# Patient Record
Sex: Female | Born: 1995 | Race: Black or African American | Hispanic: No | Marital: Single | Smoking: Former smoker
Health system: Southern US, Community
[De-identification: ages and names within clinical notes are randomized; demographics above are authoritative.]

## PROBLEM LIST (undated history)

## (undated) ENCOUNTER — Inpatient Hospital Stay (HOSPITAL_COMMUNITY): Payer: Self-pay

## (undated) ENCOUNTER — Emergency Department (HOSPITAL_COMMUNITY): Admission: EM | Payer: Medicaid Other | Source: Home / Self Care

## (undated) ENCOUNTER — Inpatient Hospital Stay (HOSPITAL_COMMUNITY): Payer: Medicaid Other

## (undated) DIAGNOSIS — J40 Bronchitis, not specified as acute or chronic: Secondary | ICD-10-CM

## (undated) DIAGNOSIS — S329XXA Fracture of unspecified parts of lumbosacral spine and pelvis, initial encounter for closed fracture: Secondary | ICD-10-CM

## (undated) DIAGNOSIS — A549 Gonococcal infection, unspecified: Secondary | ICD-10-CM

## (undated) DIAGNOSIS — N390 Urinary tract infection, site not specified: Secondary | ICD-10-CM

## (undated) DIAGNOSIS — G510 Bell's palsy: Secondary | ICD-10-CM

## (undated) DIAGNOSIS — A749 Chlamydial infection, unspecified: Secondary | ICD-10-CM

## (undated) DIAGNOSIS — A5901 Trichomonal vulvovaginitis: Secondary | ICD-10-CM

## (undated) HISTORY — PX: WISDOM TOOTH EXTRACTION: SHX21

---
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
## 2013-04-17 ENCOUNTER — Emergency Department (INDEPENDENT_AMBULATORY_CARE_PROVIDER_SITE_OTHER)
Admission: EM | Admit: 2013-04-17 | Discharge: 2013-04-17 | Disposition: A | Discharge status: Home / Self Care | Payer: Medicaid Other | Source: Home / Self Care | Attending: Emergency Medicine | Admitting: Emergency Medicine

## 2013-04-17 ENCOUNTER — Other Ambulatory Visit (HOSPITAL_COMMUNITY)
Admission: RE | Admit: 2013-04-17 | Discharge: 2013-04-17 | Disposition: A | Discharge status: Home / Self Care | Payer: Medicaid Other | Source: Ambulatory Visit | Attending: Emergency Medicine | Admitting: Emergency Medicine

## 2013-04-17 ENCOUNTER — Encounter (HOSPITAL_COMMUNITY): Payer: Self-pay | Admitting: Emergency Medicine

## 2013-04-17 DIAGNOSIS — Z113 Encounter for screening for infections with a predominantly sexual mode of transmission: Secondary | ICD-10-CM | POA: Insufficient documentation

## 2013-04-17 DIAGNOSIS — K1370 Unspecified lesions of oral mucosa: Secondary | ICD-10-CM

## 2013-04-17 DIAGNOSIS — L049 Acute lymphadenitis, unspecified: Secondary | ICD-10-CM

## 2013-04-17 DIAGNOSIS — R102 Pelvic and perineal pain: Secondary | ICD-10-CM

## 2013-04-17 DIAGNOSIS — N949 Unspecified condition associated with female genital organs and menstrual cycle: Secondary | ICD-10-CM

## 2013-04-17 DIAGNOSIS — N760 Acute vaginitis: Secondary | ICD-10-CM | POA: Insufficient documentation

## 2013-04-17 LAB — POCT URINALYSIS DIP (DEVICE)
Bilirubin Urine: NEGATIVE
Glucose, UA: NEGATIVE mg/dL
Hgb urine dipstick: NEGATIVE
Nitrite: NEGATIVE
Specific Gravity, Urine: >=1.03 (ref 1.005–1.030)
pH: 6.5 (ref 5.0–8.0)

## 2013-04-17 MED ORDER — AZITHROMYCIN 250 MG PO TABS
1000.0000 mg | ORAL_TABLET | Freq: Once | ORAL | Status: AC
  Administered 2013-04-17: 1000 mg via ORAL

## 2013-04-17 MED ORDER — NAPROXEN 500 MG PO TABS: 500.0000 mg | ORAL_TABLET | Freq: Two times a day (BID) | ORAL | Status: DC

## 2013-04-17 MED ORDER — AZITHROMYCIN 250 MG PO TABS
ORAL_TABLET | ORAL | Status: AC
  Filled 2013-04-17: qty 4

## 2013-04-17 MED ORDER — CHLORHEXIDINE GLUCONATE 0.12 % MT SOLN
15.0000 mL | Freq: Two times a day (BID) | OROMUCOSAL | Status: DC
Start: 2013-04-17 — End: 2013-08-29

## 2013-04-17 MED ORDER — LIDOCAINE HCL (PF) 1 % IJ SOLN
INTRAMUSCULAR | Status: AC
  Filled 2013-04-17: qty 5

## 2013-04-17 MED ORDER — CEFTRIAXONE SODIUM 250 MG IJ SOLR
250.0000 mg | Freq: Once | INTRAMUSCULAR | Status: AC
  Administered 2013-04-17: 250 mg via INTRAMUSCULAR

## 2013-04-17 NOTE — ED Provider Notes (Addendum)
History     CSN: [id number]  Arrival date & time 04/17/13  1241   First MD Initiated Contact with Patient 04/17/13 1305      Chief Complaint  Patient presents with  . Abdominal Pain  . Jaw Pain    (Consider location/radiation/quality/duration/timing/severity/associated sxs/prior treatment) HPI Comments: Patient presents urgent care describing pelvic discomfort and pressure WITH vaginal discharge for approximately 2 months. She does feel somewhat tired. Denies any fevers, nausea or vomiting. She does however have a tender lymph node on the mandibular angle on the right side. She also has a tender red ulcer in her lower gum line for several days. It hurts when she touches it with her tongue or when he CHEW. Denies urinary symptoms, such as burning and pressure with urination, no genitalia rashes or lesions.  Patient is a 17 y.o. female presenting with abdominal pain. The history is provided by the patient.  Abdominal Pain This is a new problem. The problem occurs constantly. Associated symptoms include abdominal pain. Pertinent negatives include no chest pain, no headaches and no shortness of breath. Nothing aggravates the symptoms. She has tried nothing for the symptoms. The treatment provided no relief.    Past Medical History  Diagnosis Date  . Bell's palsy   . Bronchitis     History reviewed. No pertinent past surgical history.  No family history on file.  History  Substance Use Topics  . Smoking status: Current Every Day Smoker    Types: Cigarettes  . Smokeless tobacco: Not on file  . Alcohol Use: Yes    OB History   Grav Para Term Preterm Abortions TAB SAB Ect Mult Living                  Review of Systems  Constitutional: Negative for fever, activity change, appetite change and fatigue.  HENT: Negative for sore throat, facial swelling, trouble swallowing and voice change.   Respiratory: Negative for shortness of breath.   Cardiovascular: Negative for chest  pain.  Gastrointestinal: Positive for abdominal pain.  Genitourinary: Positive for vaginal discharge and pelvic pain. Negative for dysuria, urgency, frequency, flank pain, decreased urine volume, genital sores and vaginal pain.  Skin: Negative for color change, pallor and rash.  Neurological: Negative for headaches.  Hematological: Does not bruise/bleed easily.    Allergies  Review of patient's allergies indicates no known allergies.  Home Medications   Current Outpatient Rx  Name  Route  Sig  Dispense  Refill  . cephALEXin (KEFLEX) 500 MG capsule   Oral   Take 1 capsule (500 mg total) by mouth 2 (two) times daily.   20 capsule   0   . chlorhexidine (PERIDEX) 0.12 % solution   Mouth/Throat   Use as directed 15 mLs in the mouth or throat 2 (two) times daily.   120 mL   0   . naproxen (NAPROSYN) 500 MG tablet   Oral   Take 1 tablet (500 mg total) by mouth 2 (two) times daily.   30 tablet   0   . phenazopyridine (PYRIDIUM) 200 MG tablet   Oral   Take 1 tablet (200 mg total) by mouth 3 (three) times daily as needed for pain.   6 tablet   0     BP 116/71  Pulse 68  Temp(Src) 98.4 F (36.9 C) (Oral)  Resp 17  SpO2 99%  LMP 03/18/2013  Physical Exam  Nursing note and vitals reviewed. Constitutional: She is oriented to person, place,  and time. Vital signs are normal. She appears well-developed and well-nourished.  Non-toxic appearance. She does not have a sickly appearance. She does not appear ill. No distress.  HENT:  Head: Normocephalic.  Mouth/Throat: Uvula is midline. Oral lesions present. No dental abscesses. No oropharyngeal exudate.    Eyes: Conjunctivae are normal.  Neck: Neck supple. No JVD present.  Pulmonary/Chest: Effort normal and breath sounds normal.  Abdominal: Soft. Normal appearance. There is no hepatosplenomegaly, splenomegaly or hepatomegaly. There is no tenderness. There is no rigidity, no rebound, no guarding, no CVA tenderness, no tenderness  at McBurney's point and negative Murphy's sign.  Musculoskeletal: She exhibits no tenderness.  Lymphadenopathy:    She has no cervical adenopathy.  Neurological: She is alert and oriented to person, place, and time.  Skin: No rash noted. No erythema.    ED Course  Procedures (including critical care time)  Labs Reviewed  POCT URINALYSIS DIP (DEVICE)  POCT PREGNANCY, URINE  URINE CYTOLOGY ANCILLARY ONLY   No results found.   1. Pelvic pain   2. Oral mucosal lesion   3. Lymphadenitis, acute       MDM  1. Vaginal discharge/pelvic pain. Patient with concerns about potential STD exposure. Patient is afebrile looks well no vomiting post-treatment. Tx Ceftriaxone and azithromycin. Samples have been obtained for urine cytology study. Have instructed patient that if worsening pelvic pain-or fevers or [facility]  2-aphthous ulcer with a reactive lymphadenopathy. Patient to use chlorhexidine.       Jimmie Molly, MD 04/17/13 1539  Jimmie Molly, MD 04/20/13 [phone]

## 2013-04-17 NOTE — ED Notes (Signed)
Pt c/o abdominal pain with discharge x 2 months. Has hx of bacterial vaginosis. Feels fatigued. Did not take temp but felt warm like she had a fever yesterday. No problems with urinating or bowel movements. Pain radiates from lower abdomen to ribs. Pt also has right sided jay pain x 1 month. Feels like lymph nodes are swollen. Hurts to chew. Patient is alert and oriented.

## 2013-04-20 ENCOUNTER — Telehealth (HOSPITAL_COMMUNITY): Payer: Self-pay | Admitting: *Deleted

## 2013-04-20 MED ORDER — METRONIDAZOLE 500 MG PO TABS: 500.0000 mg | ORAL_TABLET | Freq: Two times a day (BID) | ORAL | Status: DC

## 2013-04-20 NOTE — ED Notes (Signed)
GC/Chlamydia neg., Trich pos.  Order obtained from Dr. Ladon Applebaum for Flagyl.  I called pt. Pt. verified x 2 and given results.  Pt. told she needs Flagyl for Trich.   Pt. instructed to no alcohol while taking this medication, you need to notify your partner to be treated with Flagyl, no sex until you have finished your medication and your partner has been treated and to practice safe sex. You can get HIV testing at the [facility] STD clinic, by appointment. Pt.told where she can get her Rx. Pt. voiced understanding.

## 2013-04-23 NOTE — ED Notes (Signed)
Affirm: Candida neg., Gardnerella and Prevotella Bivia pos.  Pt. adequately treated with Flagyl. Vassie Moselle 04/23/2013

## 2013-04-29 ENCOUNTER — Encounter (HOSPITAL_COMMUNITY): Payer: Self-pay | Admitting: Emergency Medicine

## 2013-04-29 ENCOUNTER — Emergency Department (INDEPENDENT_AMBULATORY_CARE_PROVIDER_SITE_OTHER)
Admission: EM | Admit: 2013-04-29 | Discharge: 2013-04-29 | Disposition: A | Discharge status: Home / Self Care | Payer: Medicaid Other | Source: Home / Self Care | Attending: Family Medicine | Admitting: Family Medicine

## 2013-04-29 DIAGNOSIS — T148 Other injury of unspecified body region: Secondary | ICD-10-CM

## 2013-04-29 DIAGNOSIS — W57XXXA Bitten or stung by nonvenomous insect and other nonvenomous arthropods, initial encounter: Secondary | ICD-10-CM

## 2013-04-29 MED ORDER — FLUTICASONE PROPIONATE 0.05 % EX CREA: TOPICAL_CREAM | Freq: Two times a day (BID) | CUTANEOUS | Status: DC

## 2013-04-29 MED ORDER — HYDROXYZINE HCL 25 MG PO TABS: 25.0000 mg | ORAL_TABLET | Freq: Four times a day (QID) | ORAL | Status: DC

## 2013-04-29 MED ORDER — METHYLPREDNISOLONE ACETATE 40 MG/ML IJ SUSP
80.0000 mg | Freq: Once | INTRAMUSCULAR | Status: AC
  Administered 2013-04-29: 80 mg via INTRAMUSCULAR

## 2013-04-29 MED ORDER — METHYLPREDNISOLONE ACETATE 80 MG/ML IJ SUSP
INTRAMUSCULAR | Status: AC
  Filled 2013-04-29: qty 1

## 2013-04-29 NOTE — ED Notes (Signed)
Patient has bites or whelps on body and legs.  Areas itch.

## 2013-04-29 NOTE — ED Notes (Signed)
Patient reported to be outside.

## 2013-04-29 NOTE — ED Provider Notes (Addendum)
History     CSN: [id number]  Arrival date & time 04/29/13  1023   None     Chief Complaint  Patient presents with  . Insect Bite    (Consider location/radiation/quality/duration/timing/severity/associated sxs/prior treatment) Patient is a 17 y.o. female presenting with rash. The history is provided by the patient.  Rash Pain quality: burning   Duration:  3 days Timing:  Constant Progression:  Unchanged Chronicity:  New   Past Medical History  Diagnosis Date  . Bell's palsy   . Bronchitis     History reviewed. No pertinent past surgical history.  No family history on file.  History  Substance Use Topics  . Smoking status: Current Every Day Smoker    Types: Cigarettes  . Smokeless tobacco: Not on file  . Alcohol Use: Yes    OB History   Grav Para Term Preterm Abortions TAB SAB Ect Mult Living                  Review of Systems  Constitutional: Negative.   Skin: Positive for rash.    Allergies  Review of patient's allergies indicates no known allergies.  Home Medications   Current Outpatient Rx  Name  Route  Sig  Dispense  Refill  . cephALEXin (KEFLEX) 500 MG capsule   Oral   Take 1 capsule (500 mg total) by mouth 2 (two) times daily.   20 capsule   0   . chlorhexidine (PERIDEX) 0.12 % solution   Mouth/Throat   Use as directed 15 mLs in the mouth or throat 2 (two) times daily.   120 mL   0   . fluticasone (CUTIVATE) 0.05 % cream   Topical   Apply topically 2 (two) times daily.   60 g   0   . hydrOXYzine (ATARAX/VISTARIL) 25 MG tablet   Oral   Take 1 tablet (25 mg total) by mouth every 6 (six) hours. For itching   20 tablet   1   . metroNIDAZOLE (FLAGYL) 500 MG tablet   Oral   Take 1 tablet (500 mg total) by mouth 2 (two) times daily.   14 tablet   0   . naproxen (NAPROSYN) 500 MG tablet   Oral   Take 1 tablet (500 mg total) by mouth 2 (two) times daily.   30 tablet   0   . phenazopyridine (PYRIDIUM) 200 MG tablet   Oral  Take 1 tablet (200 mg total) by mouth 3 (three) times daily as needed for pain.   6 tablet   0     BP 107/60  Pulse 72  Temp(Src) 98.4 F (36.9 C) (Oral)  Resp 16  SpO2 100%  LMP 04/29/2013  Physical Exam  Nursing note and vitals reviewed. Constitutional: She is oriented to person, place, and time. She appears well-developed and well-nourished.  Neck: Normal range of motion. Neck supple.  Neurological: She is alert and oriented to person, place, and time.  Skin: Rash noted.  Multiple insect bites on ext     ED Course  Procedures (including critical care time)  Labs Reviewed - No data to display No results found.   1. Multiple insect bites       MDM          Linna Hoff, MD 04/29/13 1144  Linna Hoff, MD 04/29/13 1357

## 2013-08-29 ENCOUNTER — Emergency Department (INDEPENDENT_AMBULATORY_CARE_PROVIDER_SITE_OTHER)
Admission: EM | Admit: 2013-08-29 | Discharge: 2013-08-29 | Disposition: A | Discharge status: Nursing Home (Includes ICF) | Payer: Medicaid Other | Source: Home / Self Care | Attending: Emergency Medicine | Admitting: Emergency Medicine

## 2013-08-29 ENCOUNTER — Emergency Department (HOSPITAL_COMMUNITY)
Admission: EM | Admit: 2013-08-29 | Discharge: 2013-08-30 | Disposition: A | Discharge status: Home / Self Care | Payer: Medicaid Other | Attending: Emergency Medicine | Admitting: Emergency Medicine

## 2013-08-29 ENCOUNTER — Encounter (HOSPITAL_COMMUNITY): Payer: Self-pay

## 2013-08-29 ENCOUNTER — Encounter (HOSPITAL_COMMUNITY): Payer: Self-pay | Admitting: Emergency Medicine

## 2013-08-29 DIAGNOSIS — Z8669 Personal history of other diseases of the nervous system and sense organs: Secondary | ICD-10-CM | POA: Insufficient documentation

## 2013-08-29 DIAGNOSIS — Y9389 Activity, other specified: Secondary | ICD-10-CM | POA: Insufficient documentation

## 2013-08-29 DIAGNOSIS — R112 Nausea with vomiting, unspecified: Secondary | ICD-10-CM | POA: Insufficient documentation

## 2013-08-29 DIAGNOSIS — Z3202 Encounter for pregnancy test, result negative: Secondary | ICD-10-CM | POA: Insufficient documentation

## 2013-08-29 DIAGNOSIS — T398X1A Poisoning by other nonopioid analgesics and antipyretics, not elsewhere classified, accidental (unintentional), initial encounter: Secondary | ICD-10-CM | POA: Insufficient documentation

## 2013-08-29 DIAGNOSIS — Z79899 Other long term (current) drug therapy: Secondary | ICD-10-CM | POA: Insufficient documentation

## 2013-08-29 DIAGNOSIS — F17200 Nicotine dependence, unspecified, uncomplicated: Secondary | ICD-10-CM | POA: Insufficient documentation

## 2013-08-29 DIAGNOSIS — Y929 Unspecified place or not applicable: Secondary | ICD-10-CM | POA: Insufficient documentation

## 2013-08-29 DIAGNOSIS — Z792 Long term (current) use of antibiotics: Secondary | ICD-10-CM | POA: Insufficient documentation

## 2013-08-29 DIAGNOSIS — F39 Unspecified mood [affective] disorder: Secondary | ICD-10-CM | POA: Insufficient documentation

## 2013-08-29 DIAGNOSIS — Z8709 Personal history of other diseases of the respiratory system: Secondary | ICD-10-CM | POA: Insufficient documentation

## 2013-08-29 DIAGNOSIS — T391X1A Poisoning by 4-Aminophenol derivatives, accidental (unintentional), initial encounter: Secondary | ICD-10-CM

## 2013-08-29 DIAGNOSIS — R1110 Vomiting, unspecified: Secondary | ICD-10-CM

## 2013-08-29 DIAGNOSIS — IMO0002 Reserved for concepts with insufficient information to code with codable children: Secondary | ICD-10-CM | POA: Insufficient documentation

## 2013-08-29 DIAGNOSIS — R109 Unspecified abdominal pain: Secondary | ICD-10-CM | POA: Insufficient documentation

## 2013-08-29 LAB — CBC
MCHC: 36.1 g/dL — ABNORMAL HIGH (ref 30.0–36.0)
MCV: 86.6 fL (ref 78.0–100.0)
Platelets: 234 10*3/uL (ref 150–400)
RDW: 12.1 % (ref 11.5–15.5)
WBC: 4.4 10*3/uL (ref 4.0–10.5)

## 2013-08-29 LAB — RAPID URINE DRUG SCREEN, HOSP PERFORMED
Amphetamines: NOT DETECTED
Benzodiazepines: NOT DETECTED
Cocaine: NOT DETECTED
Opiates: NOT DETECTED

## 2013-08-29 LAB — COMPREHENSIVE METABOLIC PANEL
AST: 18 U/L (ref 0–37)
Albumin: 4.5 g/dL (ref 3.5–5.2)
BUN: 14 mg/dL (ref 6–23)
Chloride: 103 mEq/L (ref 96–112)
Creatinine, Ser: 0.65 mg/dL (ref 0.50–1.10)
Total Bilirubin: 0.4 mg/dL (ref 0.3–1.2)
Total Protein: 7.9 g/dL (ref 6.0–8.3)

## 2013-08-29 LAB — SALICYLATE LEVEL: Salicylate Lvl: <2 mg/dL — ABNORMAL LOW (ref 2.8–20.0)

## 2013-08-29 LAB — ACETAMINOPHEN LEVEL: Acetaminophen (Tylenol), Serum: 55.6 ug/mL — ABNORMAL HIGH (ref 10–30)

## 2013-08-29 LAB — ETHANOL: Alcohol, Ethyl (B): <11 mg/dL (ref 0–11)

## 2013-08-29 MED ORDER — SODIUM CHLORIDE 0.9 % IV BOLUS (SEPSIS)
1000.0000 mL | Freq: Once | INTRAVENOUS | Status: AC
  Administered 2013-08-29: 1000 mL via INTRAVENOUS

## 2013-08-29 MED ORDER — ONDANSETRON HCL 4 MG/2ML IJ SOLN
INTRAMUSCULAR | Status: AC
  Administered 2013-08-29: 4 mg via INTRAVENOUS
  Filled 2013-08-29: qty 2

## 2013-08-29 MED ORDER — ONDANSETRON HCL 4 MG/2ML IJ SOLN
4.0000 mg | Freq: Once | INTRAMUSCULAR | Status: AC
  Filled 2013-08-29: qty 2

## 2013-08-29 MED ORDER — ONDANSETRON HCL 4 MG/2ML IJ SOLN
4.0000 mg | Freq: Once | INTRAMUSCULAR | Status: AC
  Administered 2013-08-29: 4 mg via INTRAVENOUS

## 2013-08-29 MED ORDER — SODIUM CHLORIDE 0.9 % IV BOLUS (SEPSIS)
125.0000 mL | Freq: Once | INTRAVENOUS | Status: AC
  Administered 2013-08-29: 22:00:00 via INTRAVENOUS

## 2013-08-29 NOTE — ED Provider Notes (Addendum)
CSN: [id number]     Arrival date & time 08/29/13  1949 History   First MD Initiated Contact with Patient 08/29/13 2021     Chief Complaint  Patient presents with  . Drug Overdose   (Consider location/radiation/quality/duration/timing/severity/associated sxs/prior Treatment) HPI Comments: 17 yo AA female presents for UC with cc of Tylenol overdose.  Pt took 12 "600mg  tabs" and 1 Naproxyn 500mg .  Pt took them over a course of 1400 and took them up to 1700.  She took 10 tabs at once and then 2 more.  She reports n/v and abd pain.  Pt wasn't trying to kill herself.  She was sad and hurt and wanted to feel numb. She was unaware that tylenol could hurt her.     All: NKDA Med: Tylenol and motrin prn PMH: none PSH: none SHX: no EtOH tonight, Pt smoked one MJ joint tonight, no other drug use   Patient is a 17 y.o. female presenting with Overdose. The history is provided by the patient.  Drug Overdose This is a new problem. Episode onset: 1430 to 1900. The problem has been gradually worsening. Associated symptoms include abdominal pain. Pertinent negatives include no chest pain, no headaches and no shortness of breath. Nothing aggravates the symptoms. She has tried nothing for the symptoms.    Past Medical History  Diagnosis Date  . Bell's palsy   . Bronchitis    History reviewed. No pertinent past surgical history. History reviewed. No pertinent family history. History  Substance Use Topics  . Smoking status: Current Every Day Smoker -- 0.50 packs/day    Types: Cigarettes  . Smokeless tobacco: Not on file  . Alcohol Use: Yes   OB History   Grav Para Term Preterm Abortions TAB SAB Ect Mult Living                 Review of Systems  Constitutional: Negative.  Negative for fever, chills, diaphoresis, activity change, appetite change and fatigue.  Eyes: Negative.   Respiratory: Negative for apnea, cough, choking, chest tightness, shortness of breath, wheezing and stridor.    Cardiovascular: Negative for chest pain, palpitations and leg swelling.  Gastrointestinal: Positive for nausea, vomiting and abdominal pain. Negative for diarrhea, constipation, blood in stool, anal bleeding and rectal pain.  Genitourinary: Negative.  Negative for decreased urine volume, vaginal bleeding and vaginal discharge.  Skin: Negative.   Neurological: Negative for headaches.  Psychiatric/Behavioral: Positive for dysphoric mood. Negative for hallucinations, behavioral problems, decreased concentration and agitation. The patient is not nervous/anxious and is not hyperactive.     Allergies  Review of patient's allergies indicates no known allergies.  Home Medications   Current Outpatient Rx  Name  Route  Sig  Dispense  Refill  . cephALEXin (KEFLEX) 500 MG capsule   Oral   Take 1 capsule (500 mg total) by mouth 2 (two) times daily.   20 capsule   0   . chlorhexidine (PERIDEX) 0.12 % solution   Mouth/Throat   Use as directed 15 mLs in the mouth or throat 2 (two) times daily.   120 mL   0   . fluticasone (CUTIVATE) 0.05 % cream   Topical   Apply topically 2 (two) times daily.   60 g   0   . hydrOXYzine (ATARAX/VISTARIL) 25 MG tablet   Oral   Take 1 tablet (25 mg total) by mouth every 6 (six) hours. For itching   20 tablet   1   . metroNIDAZOLE (FLAGYL) 500  MG tablet   Oral   Take 1 tablet (500 mg total) by mouth 2 (two) times daily.   14 tablet   0   . naproxen (NAPROSYN) 500 MG tablet   Oral   Take 1 tablet (500 mg total) by mouth 2 (two) times daily.   30 tablet   0   . phenazopyridine (PYRIDIUM) 200 MG tablet   Oral   Take 1 tablet (200 mg total) by mouth 3 (three) times daily as needed for pain.   6 tablet   0    BP 118/80  Pulse 76  Temp(Src) 98 F (36.7 C) (Oral)  Resp 16  Ht 5\' 4"  (1.626 m)  Wt 113 lb (51.256 kg)  BMI 19.39 kg/m2  SpO2 100%  LMP 08/25/2013 Physical Exam  Nursing note and vitals reviewed. Constitutional: She is oriented  to person, place, and time. She appears well-developed and well-nourished.  HENT:  Head: Normocephalic and atraumatic.  Eyes: Conjunctivae are normal.  Neck: Normal range of motion. Neck supple.  Cardiovascular: Normal rate and regular rhythm.  Exam reveals no gallop and no friction rub.   No murmur heard. Pulmonary/Chest: Effort normal and breath sounds normal.  Abdominal: Soft. Bowel sounds are normal. She exhibits no distension and no mass. There is no rebound and no guarding.  Musculoskeletal: Normal range of motion.  Neurological: She is alert and oriented to person, place, and time. She has normal reflexes. No cranial nerve deficit. Coordination normal.  Skin: Skin is warm and dry.  Psychiatric: She has a normal mood and affect. Her behavior is normal. Judgment and thought content normal.  Denies SI/HI    ED Course  Procedures (including critical care time) Labs Review  Imaging Review No results found.   Date: 08/29/2013  Rate: 73  Rhythm: normal sinus rhythm and sinus tachycardia  QRS Axis: normal  Intervals: normal  ST/T Wave abnormalities: normal  Conduction Disutrbances:none  Narrative Interpretation: NSR, low voltage, QTc 458  Old EKG Reviewed: none available Results for orders placed during the hospital encounter of 08/29/13  CBC      Result Value Range   WBC 4.4  4.0 - 10.5 K/uL   RBC 4.48  3.87 - 5.11 MIL/uL   Hemoglobin 14.0  12.0 - 15.0 g/dL   HCT 16.1  09.6 - 04.5 %   MCV 86.6  78.0 - 100.0 fL   MCH 31.3  26.0 - 34.0 pg   MCHC 36.1 (*) 30.0 - 36.0 g/dL   RDW 40.9  81.1 - 91.4 %   Platelets 234  150 - 400 K/uL  COMPREHENSIVE METABOLIC PANEL      Result Value Range   Sodium 140  135 - 145 mEq/L   Potassium 3.9  3.5 - 5.1 mEq/L   Chloride 103  96 - 112 mEq/L   CO2 21  19 - 32 mEq/L   Glucose, Bld 80  70 - 99 mg/dL   BUN 14  6 - 23 mg/dL   Creatinine, Ser 7.82  0.50 - 1.10 mg/dL   Calcium 9.5  8.4 - 95.6 mg/dL   Total Protein 7.9  6.0 - 8.3 g/dL    Albumin 4.5  3.5 - 5.2 g/dL   AST 18  0 - 37 U/L   ALT 11  0 - 35 U/L   Alkaline Phosphatase 45  39 - 117 U/L   Total Bilirubin 0.4  0.3 - 1.2 mg/dL   GFR calc non Af Amer >90  >90  mL/min   GFR calc Af Amer >90  >90 mL/min  ACETAMINOPHEN LEVEL      Result Value Range   Acetaminophen (Tylenol), Serum 55.6 (*) 10 - 30 ug/mL  SALICYLATE LEVEL      Result Value Range   Salicylate Lvl <2.0 (*) 2.8 - 20.0 mg/dL  URINE RAPID DRUG SCREEN (HOSP PERFORMED)      Result Value Range   Opiates NONE DETECTED  NONE DETECTED   Cocaine NONE DETECTED  NONE DETECTED   Benzodiazepines NONE DETECTED  NONE DETECTED   Amphetamines NONE DETECTED  NONE DETECTED   Tetrahydrocannabinol POSITIVE (*) NONE DETECTED   Barbiturates NONE DETECTED  NONE DETECTED  ETHANOL      Result Value Range   Alcohol, Ethyl (B) <11  0 - 11 mg/dL  ACETAMINOPHEN LEVEL      Result Value Range   Acetaminophen (Tylenol), Serum 24.7  10 - 30 ug/mL  POCT PREGNANCY, URINE      Result Value Range   Preg Test, Ur NEGATIVE  NEGATIVE     MDM  No diagnosis found. 17 yo AA female presents with acute APAP ingestion.  12 x 650 =  Plan for tox labs, IVF, EKG, and likely NAC treatment based on history and n/v.  Will c/s P[facility].    9:01 PM Spoke with [facility] SPI (Patty) plan to wait for APAP and LFTs.  If APAP is toxic pt will need NAC, if LFTs elevated will give NAC.  NO indications for AC at this time.    1:43 AM D/w [facility] SPI (Patty) - both tylenol levels are nontoxic, LFTs are normal; pt has no evidence of toxidrome, no further treatment.  Lengthy d/w pt who denies SI/HI.  She is cogent, alert, and oriented, w/o intoxication (UDS neg except for + THC).  Pt denies thoughts  of harming herself or others.  She never had SI/HI.  She verbally contracts for safety.  I estimate that she is at low risk for SI/SA.  Strict ER precautions were given.  Pt agrees with plan.  Boyfriend at bedside and will be with pt tonight.    Darlys Gales, MD 08/30/13  1610  Darlys Gales, MD 08/30/13 [phone]

## 2013-08-29 NOTE — ED Notes (Signed)
Reported overdose of stated 13 tablets of 650 mg tylenol and 1 naprosyn , estimated by patient to be 1-2 h PTA to the [facility]; pt stated she was dropped off by her boyfriend. Admits to also having smoked weed when she had ingested the tylenol; LMP stated to be 08-21-2013. C/o she had been "sad", has been vomiting, pain in mid and upper abdominal area , HA, chest discomfort , heartburn, dizzy

## 2013-08-29 NOTE — ED Notes (Signed)
Patient reports that she was depressed and she took too much tylenol

## 2013-08-29 NOTE — ED Notes (Signed)
Pt actively vomiting.

## 2013-08-29 NOTE — ED Provider Notes (Signed)
Chief Complaint:   Chief Complaint  Patient presents with  . Drug Overdose    History of Present Illness:   Linda Khan is a 17 year old female who [facility] stating that she had overdosed on 13 tablets of acetaminophen 650 mg about an hour previously. She complains of headache, chest pain, heartburn, diarrhea, and dizziness. She does not have any difficulty breathing. She also states she took one naproxen and a small one marijuana cigarette this afternoon. The patient states she's been feeling sad, but declines to go into any details. She's not very communicative. She denies any anxiety, homicidal ideation, or hallucinations or delusions. She does not have any prior psychiatric history or history of prior overdoses.  Review of Systems:  Other than noted above, the patient denies any of the following symptoms: Systemic:  No fever, chills, sweats, fatigue, weight loss or gain. Resp:  No shortness of breath. Cardiovasc:  No chest pain, tightness, pressure, palpitations, dizziness, or syncope. GI:  No abdominal pain, nausea, vomiting, anorexia, diarrhea, or constipation. Neuro:  No headache, paresthesias, tremor, or muscle weakness. Psych:  No sadness, depression, crying, anxiety, panic, sleep disturbance, or suicidal or homicidal ideation.  No hallucinations or delusions.  PMFSH:  Past medical history, family history, social history, meds, and allergies were reviewed.   Physical Exam:   Vital signs:  There were no vitals taken for this visit. Gen:  Alert, oriented, in no distress. Lungs:  No respiratory distress.  Breath sounds clear and equal bilaterally.  No wheezes, rales, or rhonchi. Heart:  Regular rthythm.  No gallops, murmers, clicks or rubs. Abdomen:  Soft, flat and nontender.  No organomegaly or mass. Neuro:  Alert and oriented times 3. Speech clear, fluent and appropriate.  Cranial nerves intact.  No focal weakness. Psych:  She is somewhat irritable and  not very communicative.  Speech pattern is slow but otherwise normal.  Thought content normal with no homicidal ideation.  No paranoia, hallucinations, or delusions.  Memory, insight, and judgement normal.  Assessment:  The encounter diagnosis was Acetaminophen overdose, initial encounter.   Plan:   The patient was transferred to the ED via shuttle under supervision of security in stable condition.  Medical Decision Making: 17 year old female overdosed on 13, 650 mg acetaminophen tablets about an hour prior to coming here. Symptoms right now include headache, chest pain, heartburn, dizziness, and loose stools. She also took one naproxen, and smoked a marijuana cigarette previously. She denies use of alcohol or any other overdoses. Patient states she's been sad. She denies any prior history of overdoses or depression. She denies hallucinations or delusions. She's otherwise in good health.        Reuben Likes, MD 08/29/13 [phone]

## 2013-08-30 MED ORDER — ONDANSETRON HCL 4 MG PO TABS: 4.0000 mg | ORAL_TABLET | Freq: Four times a day (QID) | ORAL | Status: DC

## 2013-10-20 ENCOUNTER — Encounter (HOSPITAL_COMMUNITY): Payer: Self-pay | Admitting: Emergency Medicine

## 2013-10-20 ENCOUNTER — Emergency Department (INDEPENDENT_AMBULATORY_CARE_PROVIDER_SITE_OTHER)
Admission: EM | Admit: 2013-10-20 | Discharge: 2013-10-20 | Disposition: A | Discharge status: Home / Self Care | Payer: Medicaid Other | Source: Home / Self Care | Attending: Emergency Medicine | Admitting: Emergency Medicine

## 2013-10-20 DIAGNOSIS — R0789 Other chest pain: Secondary | ICD-10-CM

## 2013-10-20 DIAGNOSIS — E639 Nutritional deficiency, unspecified: Secondary | ICD-10-CM

## 2013-10-20 DIAGNOSIS — R071 Chest pain on breathing: Secondary | ICD-10-CM

## 2013-10-20 LAB — POCT URINALYSIS DIP (DEVICE)
Bilirubin Urine: NEGATIVE
Glucose, UA: NEGATIVE mg/dL
Ketones, ur: NEGATIVE mg/dL
Nitrite: NEGATIVE
Specific Gravity, Urine: 1.025 (ref 1.005–1.030)
pH: 7 (ref 5.0–8.0)

## 2013-10-20 NOTE — ED Provider Notes (Signed)
Medical screening examination/treatment/procedure(s) were performed by non-physician practitioner and as supervising physician I was immediately available for consultation/collaboration.  Kylie Gros, M.D.  Fawn Desrocher C Mercede Rollo, MD 10/20/13 1631 

## 2013-10-20 NOTE — ED Notes (Addendum)
C/o she has been having blood tinged sputum, green/yellow secretions when coughs; unsure LMP, sexually active w/o BC (If it happens, it happens) also c/o lower back pain

## 2013-10-20 NOTE — ED Provider Notes (Signed)
CSN: [id number]     Arrival date & time 10/20/13  1250 History   First MD Initiated Contact with Patient 10/20/13 1400     Chief Complaint  Patient presents with  . Hemoptysis   (Consider location/radiation/quality/duration/timing/severity/associated sxs/prior Treatment) HPI Comments: Pt unsure of lmp. Also c/o feeling weak and occasionally has vague abd pain and vague back pain.   Patient is a 17 y.o. female presenting with chest pain. The history is provided by the patient.  Chest Pain Chest pain location: central upper chest. Pain quality: aching and tightness   Pain radiates to:  Does not radiate Pain radiates to the back: no   Pain severity:  Mild Onset quality:  Unable to specify Duration:  1 week Timing:  Intermittent Progression:  Unchanged Chronicity:  New Context comment:  Only has pain when takes a deep breath, and it doesn't happen with every deep breath Relieved by:  None tried Worsened by:  Deep breathing Ineffective treatments:  None tried Associated symptoms: no abdominal pain, no cough, no dizziness, no fever, no nausea, no palpitations, no shortness of breath and not vomiting     Past Medical History  Diagnosis Date  . Bell's palsy   . Bronchitis    History reviewed. No pertinent past surgical history. History reviewed. No pertinent family history. History  Substance Use Topics  . Smoking status: Current Every Day Smoker -- 0.50 packs/day    Types: Cigarettes  . Smokeless tobacco: Not on file  . Alcohol Use: Yes   OB History   Grav Para Term Preterm Abortions TAB SAB Ect Mult Living                 Review of Systems  Constitutional: Positive for appetite change. Negative for fever and activity change.       Not interested in food so eating less for last week.   Respiratory: Negative for cough and shortness of breath.   Cardiovascular: Positive for chest pain. Negative for palpitations.  Gastrointestinal: Negative for nausea, vomiting and  abdominal pain.  Neurological: Negative for dizziness.    Allergies  Review of patient's allergies indicates no known allergies.  Home Medications   Current Outpatient Rx  Name  Route  Sig  Dispense  Refill  . ondansetron (ZOFRAN) 4 MG tablet   Oral   Take 1 tablet (4 mg total) by mouth every 6 (six) hours.   12 tablet   0    BP 109/75  Pulse 70  Temp(Src) 98.7 F (37.1 C) (Oral)  Resp 16  SpO2 100% Physical Exam  Constitutional: She appears well-developed and well-nourished. No distress.  Cardiovascular: Normal rate and regular rhythm.   Pulmonary/Chest: Effort normal and breath sounds normal. She exhibits tenderness.    Abdominal: Soft. Bowel sounds are normal. She exhibits no distension. There is no tenderness. There is no CVA tenderness.  Musculoskeletal:       Lumbar back: She exhibits no tenderness and no bony tenderness.    ED Course  Procedures (including critical care time) Labs Review Labs Reviewed  POCT URINALYSIS DIP (DEVICE) - Abnormal; Notable for the following:    Hgb urine dipstick SMALL (*)    All other components within normal limits  POCT PREGNANCY, URINE   Imaging Review No results found.  EKG Interpretation    Date/Time:    Ventricular Rate:    PR Interval:    QRS Duration:   QT Interval:    QTC Calculation:   R Axis:  Text Interpretation:              MDM   1. Chest wall pain   2. Inadequate dietary intake   Pt vague with sx and concerns.  I think she mostly wanted a pregnancy test.  Pt unconcerned with sx after I gave her test results, ready to go.     Cathlyn Parsons, NP 10/20/13 1550

## 2013-12-04 ENCOUNTER — Encounter (HOSPITAL_COMMUNITY): Payer: Self-pay | Admitting: Emergency Medicine

## 2013-12-04 ENCOUNTER — Emergency Department (HOSPITAL_COMMUNITY)
Admission: EM | Admit: 2013-12-04 | Discharge: 2013-12-04 | Disposition: A | Discharge status: Home / Self Care | Payer: Medicaid Other | Attending: Emergency Medicine | Admitting: Emergency Medicine

## 2013-12-04 DIAGNOSIS — Z8709 Personal history of other diseases of the respiratory system: Secondary | ICD-10-CM | POA: Insufficient documentation

## 2013-12-04 DIAGNOSIS — F17200 Nicotine dependence, unspecified, uncomplicated: Secondary | ICD-10-CM | POA: Insufficient documentation

## 2013-12-04 DIAGNOSIS — Z8669 Personal history of other diseases of the nervous system and sense organs: Secondary | ICD-10-CM | POA: Insufficient documentation

## 2013-12-04 DIAGNOSIS — L03317 Cellulitis of buttock: Principal | ICD-10-CM

## 2013-12-04 DIAGNOSIS — L0231 Cutaneous abscess of buttock: Secondary | ICD-10-CM | POA: Insufficient documentation

## 2013-12-04 MED ORDER — IBUPROFEN 800 MG PO TABS
800.0000 mg | ORAL_TABLET | Freq: Once | ORAL | Status: AC
  Administered 2013-12-04: 800 mg via ORAL
  Filled 2013-12-04: qty 1

## 2013-12-04 MED ORDER — CEPHALEXIN 500 MG PO CAPS: 500.0000 mg | ORAL_CAPSULE | Freq: Four times a day (QID) | ORAL | Status: DC

## 2013-12-04 MED ORDER — SULFAMETHOXAZOLE-TRIMETHOPRIM 800-160 MG PO TABS: 1.0000 | ORAL_TABLET | Freq: Two times a day (BID) | ORAL | Status: AC

## 2013-12-04 MED ORDER — HYDROCODONE-ACETAMINOPHEN 5-325 MG PO TABS: 1.0000 | ORAL_TABLET | Freq: Four times a day (QID) | ORAL | Status: DC | PRN

## 2013-12-04 NOTE — ED Notes (Signed)
Pt brought in by ems c/o boil on abscess

## 2013-12-04 NOTE — ED Provider Notes (Signed)
Medical screening examination/treatment/procedure(s) were performed by non-physician practitioner and as supervising physician I was immediately available for consultation/collaboration.    Arihaan Bellucci, MD 12/04/13 0612 

## 2013-12-04 NOTE — ED Provider Notes (Signed)
CSN: [id number]     Arrival date & time 12/04/13  0025 History   First MD Initiated Contact with Patient 12/04/13 0057     Chief Complaint  Patient presents with  . Abscess   (Consider location/radiation/quality/duration/timing/severity/associated sxs/prior Treatment) Patient is a 18 y.o. female presenting with abscess. The history is provided by the patient. No language interpreter was used.  Abscess Abscess location: R buttocks. Abscess quality: induration, painful, redness and weeping   Red streaking: no   Duration:  6 days Progression:  Worsening Pain details:    Timing:  Constant   Progression:  Worsening Chronicity:  New Context: not diabetes, not immunosuppression and not skin injury   Relieved by:  Nothing Exacerbated by: pressure to area. Associated symptoms: no anorexia, no fatigue, no fever, no nausea and no vomiting   Risk factors: no hx of MRSA and no prior abscess     Past Medical History  Diagnosis Date  . Bell's palsy   . Bronchitis    History reviewed. No pertinent past surgical history. No family history on file. History  Substance Use Topics  . Smoking status: Current Every Day Smoker -- 0.50 packs/day    Types: Cigarettes  . Smokeless tobacco: Not on file  . Alcohol Use: Yes   OB History   Grav Para Term Preterm Abortions TAB SAB Ect Mult Living                 Review of Systems  Constitutional: Negative for fever and fatigue.  Gastrointestinal: Negative for nausea, vomiting, rectal pain and anorexia.  Genitourinary: Negative for vaginal bleeding, vaginal discharge and vaginal pain.  Skin: Positive for color change.  All other systems reviewed and are negative.    Allergies  Review of patient's allergies indicates no known allergies.  Home Medications   Current Outpatient Rx  Name  Route  Sig  Dispense  Refill  . cephALEXin (KEFLEX) 500 MG capsule   Oral   Take 1 capsule (500 mg total) by mouth 4 (four) times daily.   28 capsule    0   . HYDROcodone-acetaminophen (NORCO/VICODIN) 5-325 MG per tablet   Oral   Take 1-2 tablets by mouth every 6 (six) hours as needed.   7 tablet   0   . sulfamethoxazole-trimethoprim (BACTRIM DS,SEPTRA DS) 800-160 MG per tablet   Oral   Take 1 tablet by mouth 2 (two) times daily.   14 tablet   0    BP 103/58  Pulse 77  Temp(Src) 98.5 F (36.9 C) (Oral)  Resp 16  SpO2 99%  Physical Exam  Nursing note and vitals reviewed. Constitutional: She is oriented to person, place, and time. She appears well-developed and well-nourished. No distress.  HENT:  Head: Normocephalic and atraumatic.  Eyes: Conjunctivae and EOM are normal. No scleral icterus.  Neck: Normal range of motion.  Pulmonary/Chest: Effort normal. No respiratory distress.  Genitourinary: Rectum normal. Rectal exam shows no external hemorrhoid, no fissure, no tenderness and anal tone normal.     No induration to the perineal region. No induration or TTP to rectum.  Musculoskeletal: Normal range of motion.  Neurological: She is alert and oriented to person, place, and time.  GCS 15. Patient moves extremities without ataxia.  Skin: Skin is warm and dry. No rash noted. She is not diaphoretic. No pallor.  Abscess to R inferior gluteal region. Indurated with little fluctuance; induration approximately 5cmx2cm. +Erythema without linear streaking. Central pustule draining minimally purulent serosanguinous fluid.  Psychiatric: She has a normal mood and affect. Her behavior is normal.    ED Course  Procedures (including critical care time) Labs Review Labs Reviewed - No data to display Imaging Review No results found.  EKG Interpretation   None      INCISION AND DRAINAGE Performed by: Antony Madura Consent: Verbal consent obtained. Risks and benefits: risks, benefits and alternatives were discussed Type: abscess  Body area: R buttocks  Anesthesia: local infiltration  Incision was made with a  scalpel.  Local anesthetic: lidocaine 2% with epinephrine  Anesthetic total: 6 ml  Complexity: complex Blunt dissection to break up loculations  Drainage: purulent  Drainage amount: small  Packing material: none  Patient tolerance: Patient tolerated the procedure well with no immediate complications.   MDM   1. Cellulitis and abscess of buttock    Uncomplicated cellulitis and abscess of right buttocks. Patient well and nontoxic appearing, hemodynamically stable, and afebrile. No rectal involvement or induration appreciated on physical exam. No induration or tenderness to the perineum. Patient denies any puslike drainage from her rectum or vaginal canal. She further denies any fevers, chills, vomiting, or pain with defecation.  Abscess incised and drained at bedside with small amount of purulent/bloody drainage. Given small amount of drainage, have recommended patient return in 48 hours for a recheck. Will also prescribe course of antibiotics for cellulitis. Return precautions discussed and patient agreeable to plan with no unaddressed concerns.   Filed Vitals:   12/04/13 0029 12/04/13 0113  BP: 112/71 103/58  Pulse: 99 77  Temp: 98.4 F (36.9 C) 98.5 F (36.9 C)  TempSrc:  Oral  Resp: 20 16  SpO2: 99% 99%     Antony Madura, PA-C 12/04/13 0241

## 2013-12-04 NOTE — ED Notes (Addendum)
Pt. Requesting ibuprofen before laving. PA made aware.

## 2013-12-04 NOTE — Discharge Instructions (Signed)
Recommend frequent warm soaks and warm compresses to the area, at least 4 times a day. Take the antibiotics prescribed to for the full course. Take ibuprofen as needed for pain control, 600 mg every 6 hours. You may take Norco as needed for severe pain. Keep the area clean and dry as much as possible. Return for a recheck in 48 hours. Return sooner if symptoms worsen.  Abscess Care After An abscess (also called a boil or furuncle) is an infected area that contains a collection of pus. Signs and symptoms of an abscess include pain, tenderness, redness, or hardness, or you may feel a moveable soft area under your skin. An abscess can occur anywhere in the body. The infection may spread to surrounding tissues causing cellulitis. A cut (incision) by the surgeon was made over your abscess and the pus was drained out. Gauze may have been packed into the space to provide a drain that will allow the cavity to heal from the inside outwards. The boil may be painful for 5 to 7 days. Most people with a boil do not have high fevers. Your abscess, if seen early, may not have localized, and may not have been lanced. If not, another appointment may be required for this if it does not get better on its own or with medications. HOME CARE INSTRUCTIONS   Only take over-the-counter or prescription medicines for pain, discomfort, or fever as directed by your caregiver.  When you bathe, soak and then remove gauze or iodoform packs at least daily or as directed by your caregiver. You may then wash the wound gently with mild soapy water. Repack with gauze or do as your caregiver directs. SEEK IMMEDIATE MEDICAL CARE IF:   You develop increased pain, swelling, redness, drainage, or bleeding in the wound site.  You develop signs of generalized infection including muscle aches, chills, fever, or a general ill feeling.  An oral temperature above 102 F (38.9 C) develops, not controlled by medication. See your caregiver for a  recheck if you develop any of the symptoms described above. If medications (antibiotics) were prescribed, take them as directed. Document Released: 06/01/2005 Document Revised: 02/05/2012 Document Reviewed: 01/27/2008 [facility] Patient Information 2014 [place], [place].  Cellulitis Cellulitis is an infection of the skin and the tissue under the skin. The infected area is usually red and tender. This happens most often in the arms and lower legs. HOME CARE   Take your antibiotic medicine as told. Finish the medicine even if you start to feel better.  Keep the infected arm or leg raised (elevated).  Put a warm cloth on the area up to 4 times per day.  Only take medicines as told by your doctor.  Keep all doctor visits as told. GET HELP RIGHT AWAY IF:   You have a fever.  You feel very sleepy.  You throw up (vomit) or have watery poop (diarrhea).  You feel sick and have muscle aches and pains.  You see red streaks on the skin coming from the infected area.  Your red area gets bigger or turns a dark color.  Your bone or joint under the infected area is painful after the skin heals.  Your infection comes back in the same area or different area.  You have a puffy (swollen) bump in the infected area.  You have new symptoms. MAKE SURE YOU:   Understand these instructions.  Will watch your condition.  Will get help right away if you are not doing well or  get worse. Document Released: 05/01/2008 Document Revised: 05/14/2012 Document Reviewed: 01/29/2012 [facility] Patient Information 2014 [place].

## 2013-12-04 NOTE — ED Notes (Signed)
Bed: [facility] Expected date:  Expected time:  Means of arrival:  Comments: EMS/17 yo with abscess on buttock

## 2014-01-25 ENCOUNTER — Emergency Department (HOSPITAL_COMMUNITY)
Admission: EM | Admit: 2014-01-25 | Discharge: 2014-01-25 | Disposition: A | Discharge status: Home / Self Care | Payer: Medicaid Other | Attending: Emergency Medicine | Admitting: Emergency Medicine

## 2014-01-25 ENCOUNTER — Encounter (HOSPITAL_COMMUNITY): Payer: Self-pay | Admitting: Emergency Medicine

## 2014-01-25 DIAGNOSIS — Z8669 Personal history of other diseases of the nervous system and sense organs: Secondary | ICD-10-CM | POA: Insufficient documentation

## 2014-01-25 DIAGNOSIS — Z8709 Personal history of other diseases of the respiratory system: Secondary | ICD-10-CM | POA: Insufficient documentation

## 2014-01-25 DIAGNOSIS — F17200 Nicotine dependence, unspecified, uncomplicated: Secondary | ICD-10-CM | POA: Insufficient documentation

## 2014-01-25 DIAGNOSIS — Z792 Long term (current) use of antibiotics: Secondary | ICD-10-CM | POA: Insufficient documentation

## 2014-01-25 DIAGNOSIS — Z3202 Encounter for pregnancy test, result negative: Secondary | ICD-10-CM

## 2014-01-25 LAB — POC URINE PREG, ED: Preg Test, Ur: NEGATIVE

## 2014-01-25 NOTE — ED Provider Notes (Signed)
Medical screening examination/treatment/procedure(s) were performed by non-physician practitioner and as supervising physician I was immediately available for consultation/collaboration.   EKG Interpretation None       Aurie Harroun R. Zakariya Knickerbocker, MD 01/25/14 2353 

## 2014-01-25 NOTE — ED Notes (Signed)
Pt denies abdominal pain, states she has had irregular periods before but they are normally about 2 weeks late.

## 2014-01-25 NOTE — ED Notes (Signed)
Patient states she thinks she might be pregnant.  Patient states she took a pregnancy test and it was negative.  Patient states she is feeling tired and run down.

## 2014-01-25 NOTE — Discharge Instructions (Signed)
Pregnancy Tests  HOW DO PREGNANCY TESTS WORK?  All pregnancy tests look for a special hormone in the urine or blood that is only present in pregnant women. This hormone, human chorionic gonadotropin (hCG), is also called the pregnancy hormone.   WHAT IS THE DIFFERENCE BETWEEN A URINE AND A BLOOD PREGNANCY TEST? IS ONE BETTER THAN THE OTHER?  There are two types of pregnancy tests.  · Blood tests.  · Urine tests.  Both tests look for the presence of hCG, the pregnancy hormone. Many women use a urine test or home pregnancy test (HPT) to find out if they are pregnant. HPTs are cheap, easy to use, can be done at home, and are private. When a woman has a positive result on an HPT, she needs to see her caregiver right away. The caregiver can confirm a positive HPT result with another urine test, a blood test, ultrasound, and a pelvic exam.   There are two types of blood tests you can get from a caregiver.   · A quantitative blood test (or the beta hCG test). This test measures the exact amount of hCG in the blood. This means it can pick up very small amounts of hCG, making it a very accurate test.  · A qualitative hCG blood test. This test gives a simple yes or no answer to whether you are pregnant. This test is more like a urine test in terms of its accuracy.  Blood tests can pick up hCG earlier in a pregnancy than urine tests can. Blood tests can tell if you are pregnant about 6 to 8 days after you release an egg from an ovary (ovulate). Urine tests can determine pregnancy about 2 weeks after ovulation.   HOW IS A HOME PREGNANCY TEST DONE?   There are many types of home pregnancy tests or HPTs that can be bought over-the-counter at drug or discount stores.   · Some involve collecting your urine in a cup and dipping a stick into the urine or putting some of the urine into a special container with an eyedropper.  · Others are done by placing a stick into your urine stream.  · Tests vary in how long you need to wait for  the stick or container to turn a certain color or have a symbol on it (like a plus or a minus).  · All tests come with written instructions. Most tests also have toll-free phone numbers to call if you have any questions about how to do the test or read the results.  HOW ACCURATE ARE HOME PREGNANCY TESTS?   HPTs are very accurate. Most brands of HPTs say they are 97% to 99% accurate when taken 1 week after missing your menstrual period, but this can vary with actual use. Each brand varies in how sensitive it is in picking up the pregnancy hormone hCG. If a test is not done correctly, it will be less accurate. Always check the package to make sure it is not past its expiration date. If it is, it will not be accurate. Most brands of HPTs tell users to do the test again in a few days, no matter what the results.   If you use an HPT too early in your pregnancy, you may not have enough of the pregnancy hormone hCG in your urine to have a positive test result. Most HPTs will be accurate if you test yourself around the time your period is due (about 2 weeks after you ovulate). You can   get a negative test result if you are not pregnant or if you ovulated later than you thought you did. You may also have problems with the pregnancy, which affects the amount of hCG you have in your urine. If your HPT is negative, test yourself again within a few days to 1 week. If you keep getting a negative result and think you are pregnant, talk with your caregiver right away about getting a blood pregnancy test.   FALSE POSITIVE PREGNANCY TEST  A false positive HPT can happen if there is blood or protein present in your urine. A false positive can also happen if you were recently pregnant or if you take a pregnancy test too soon after taking fertility drug that contains hCG. Also, some prescription medicines such as water pills (diuretics), tranquilizers, seizure medicines, psychiatric medicines, and allergy and nausea medicines  (promethazine) give false positive readings.  FALSE NEGATIVE PREGNANCY TEST  · A false negative HPT can happen if you do the test too early. Try to wait until you are at least 1 day late for your menstrual period.  · It may happen if you wait too long to test the urine (longer than 15 minutes).  · It may also happen if the urine is too diluted because you drank a lot of fluids before getting the urine sample. It is best to test the first morning urine after you get out of bed.  If your menstrual period did not start after a week of a negative HPT, repeat the pregnancy test.  CAN ANYTHING INTERFERE WITH HOME PREGNANCY TEST RESULTS?   Most medicines, both over-the-counter and prescription drugs, including birth control pills and antibiotics, should not affect the results of a HPT. Only those drugs that have the pregnancy hormone hCG in them can give a false positive test result. Drugs that have hCG in them may be used for treating infertility (not being able to get pregnant). Alcohol and illegal drugs do not affect HPT results, but you should not be using these substances if you are trying to get pregnant. If you have a positive pregnancy test, call your caregiver to make an appointment to begin prenatal care.  Document Released: 11/16/2003 Document Revised: 02/05/2012 Document Reviewed: 01/27/2011  [employer]® Patient Information ©2014 [employer].

## 2014-01-25 NOTE — ED Provider Notes (Signed)
CSN: [id number]     Arrival date & time 01/25/14  1932 History  This chart was scribed for non-physician practitioner, Roxy Horseman, PA-C, working with Juliet Rude. Rubin Payor, MD by Charline Bills, ED Scribe. This patient was seen in room TR08C/TR08C and the patient's care was started at 8:21 PM.    Chief Complaint  Patient presents with  . Possible Pregnancy     The history is provided by the patient. No language interpreter was used.   HPI Comments: Linda Khan is a 18 y.o. female who presents to the Emergency Department concerned of possible pregnancy. Pt states that she took a home pregnancy test that was negative. She reports irregular periods. She denies abdominal pain, vomiting and diarrhea.   Past Medical History  Diagnosis Date  . Bell's palsy   . Bronchitis    History reviewed. No pertinent past surgical history. History reviewed. No pertinent family history. History  Substance Use Topics  . Smoking status: Current Every Day Smoker -- 0.50 packs/day    Types: Cigarettes  . Smokeless tobacco: Not on file  . Alcohol Use: No   OB History   Grav Para Term Preterm Abortions TAB SAB Ect Mult Living                 Review of Systems  Gastrointestinal: Negative for nausea, vomiting and abdominal pain.     Allergies  Review of patient's allergies indicates no known allergies.  Home Medications   Current Outpatient Rx  Name  Route  Sig  Dispense  Refill  . cephALEXin (KEFLEX) 500 MG capsule   Oral   Take 1 capsule (500 mg total) by mouth 4 (four) times daily.   28 capsule   0   . HYDROcodone-acetaminophen (NORCO/VICODIN) 5-325 MG per tablet   Oral   Take 1-2 tablets by mouth every 6 (six) hours as needed.   7 tablet   0    Triage vitals: BP 122/73  Pulse 79  Temp(Src) 98.9 F (37.2 C) (Oral)  Resp 16  Wt 116 lb (52.617 kg)  SpO2 99%  LMP 11/14/2013 Physical Exam  Nursing note and vitals reviewed. Constitutional: She is oriented to person, place,  and time. She appears well-developed and well-nourished. No distress.  HENT:  Head: Normocephalic and atraumatic.  Eyes: Conjunctivae and EOM are normal. Pupils are equal, round, and reactive to light.  Neck: Normal range of motion. Neck supple. No tracheal deviation present.  Cardiovascular: Normal rate, regular rhythm and normal heart sounds.  Exam reveals no gallop and no friction rub.   No murmur heard. Pulmonary/Chest: Effort normal and breath sounds normal. No respiratory distress. She has no wheezes. She has no rales. She exhibits no tenderness.  Abdominal: Soft. Bowel sounds are normal. She exhibits no distension and no mass. There is no tenderness. There is no rebound and no guarding.  No focal abdominal tenderness, no RLQ tenderness or pain at McBurney's point, no RUQ tenderness or Murphy's sign, no left-sided abdominal tenderness, no fluid wave, or signs of peritonitis   Musculoskeletal: Normal range of motion. She exhibits no edema and no tenderness.  Neurological: She is alert and oriented to person, place, and time.  Skin: Skin is warm and dry.  Psychiatric: She has a normal mood and affect. Her behavior is normal. Judgment and thought content normal.    ED Course  Procedures (including critical care time) DIAGNOSTIC STUDIES: Oxygen Saturation is 99% on RA, normal by my interpretation.    COORDINATION  OF CARE: 8:23 PM-Discussed negative pregnancy test with patient. Will refer to OBGYN clinics.  Labs Review Labs Reviewed  POC URINE PREG, ED   Imaging Review No results found.   EKG Interpretation None      MDM   Final diagnoses:  Pregnancy test negative    Patient requesting pregnancy test. She denies any abdominal pain. Denies any discharge, dysuria. Denies any fevers. She states that she took an at-home pregnancy test was negative, but she has had irregular periods and still feels like she is pregnant. Pregnancy test here is negative. Discharged to home with  OB/GYN followup for irregular periods. I personally performed the services described in this documentation, which was scribed in my presence. The recorded information has been reviewed and is accurate.       Roxy Horsemanobert Belissa Kooy, PA-C 01/25/14 2242

## 2014-01-25 NOTE — ED Notes (Signed)
Pts LMP Dec, has been feeling tired and loses temper more than usual.  Came to ED to find out if she is pregnant.

## 2014-05-31 ENCOUNTER — Emergency Department (HOSPITAL_COMMUNITY)
Admission: EM | Admit: 2014-05-31 | Discharge: 2014-05-31 | Disposition: A | Discharge status: Home / Self Care | Payer: Medicaid Other | Source: Home / Self Care

## 2014-05-31 ENCOUNTER — Inpatient Hospital Stay (HOSPITAL_COMMUNITY)
Admission: AD | Admit: 2014-05-31 | Discharge: 2014-05-31 | Disposition: A | Discharge status: Home / Self Care | Payer: Self-pay | Source: Ambulatory Visit | Attending: Obstetrics and Gynecology | Admitting: Obstetrics and Gynecology

## 2014-05-31 DIAGNOSIS — N390 Urinary tract infection, site not specified: Secondary | ICD-10-CM | POA: Insufficient documentation

## 2014-05-31 LAB — URINALYSIS, ROUTINE W REFLEX MICROSCOPIC
BILIRUBIN URINE: NEGATIVE
Glucose, UA: NEGATIVE mg/dL
Ketones, ur: 15 mg/dL — AB
Nitrite: POSITIVE — AB
Specific Gravity, Urine: >1.03 — ABNORMAL HIGH (ref 1.005–1.030)
UROBILINOGEN UA: 0.2 mg/dL (ref 0.0–1.0)
pH: 6 (ref 5.0–8.0)

## 2014-05-31 LAB — URINE MICROSCOPIC-ADD ON

## 2014-05-31 LAB — POCT PREGNANCY, URINE: PREG TEST UR: NEGATIVE

## 2014-05-31 MED ORDER — PHENAZOPYRIDINE HCL 100 MG PO TABS
200.0000 mg | ORAL_TABLET | Freq: Three times a day (TID) | ORAL | Status: DC
  Administered 2014-05-31: 200 mg via ORAL
  Filled 2014-05-31: qty 2

## 2014-05-31 MED ORDER — PHENAZOPYRIDINE HCL 200 MG PO TABS: 200.0000 mg | ORAL_TABLET | Freq: Three times a day (TID) | ORAL | Status: DC | PRN

## 2014-05-31 MED ORDER — CIPROFLOXACIN HCL 500 MG PO TABS: 500.0000 mg | ORAL_TABLET | Freq: Two times a day (BID) | ORAL | Status: DC

## 2014-05-31 NOTE — Discharge Instructions (Signed)

## 2014-05-31 NOTE — MAU Note (Signed)
Pt presents to MAU with complaints of urine frequency with pain with urination.

## 2014-05-31 NOTE — MAU Provider Note (Signed)
Attestation of Attending Supervision of Advanced Practitioner (CNM/NP): Evaluation and management procedures were performed by the Advanced Practitioner under my supervision and collaboration.  I have reviewed the Advanced Practitioner's note and chart, and I agree with the management and plan.  Kalub Morillo 05/31/2014 8:32 PM

## 2014-05-31 NOTE — MAU Provider Note (Signed)
S: 18 y.o. female presents to MAU with s/sx of UTI.  She reports dysuria and lower abdominal pain x24 hours. Her abdominal pain is worse when urinating.  She reports she feels urgency but is unable to urinate more than a little bit each time she goes.  She denies vaginal bleeding, vaginal itching/burning, h/a, dizziness, n/v, or fever/chills.    O: BP 128/66  Pulse 63  Temp(Src) 98.9 F (37.2 C) (Oral)  Resp 16  Results for orders placed during the hospital encounter of 05/31/14 (from the past 48 hour(s))  URINALYSIS, ROUTINE W REFLEX MICROSCOPIC     Status: Abnormal   Collection Time    05/31/14  2:12 PM      Result Value Ref Range   Color, Urine YELLOW  YELLOW   APPearance HAZY (*) CLEAR   Specific Gravity, Urine >1.030 (*) 1.005 - 1.030   pH 6.0  5.0 - 8.0   Glucose, UA NEGATIVE  NEGATIVE mg/dL   Hgb urine dipstick LARGE (*) NEGATIVE   Bilirubin Urine NEGATIVE  NEGATIVE   Ketones, ur 15 (*) NEGATIVE mg/dL   Protein, ur >540>300 (*) NEGATIVE mg/dL   Urobilinogen, UA 0.2  0.0 - 1.0 mg/dL   Nitrite POSITIVE (*) NEGATIVE   Leukocytes, UA SMALL (*) NEGATIVE  URINE MICROSCOPIC-ADD ON     Status: Abnormal   Collection Time    05/31/14  2:12 PM      Result Value Ref Range   Squamous Epithelial / LPF FEW (*) RARE   WBC, UA 7-10  <3 WBC/hpf   RBC / HPF 11-20  <3 RBC/hpf   Bacteria, UA FEW (*) RARE  POCT PREGNANCY, URINE     Status: None   Collection Time    05/31/14  2:25 PM      Result Value Ref Range   Preg Test, Ur NEGATIVE  NEGATIVE   Comment:            THE SENSITIVITY OF THIS     METHODOLOGY IS >24 mIU/mL   A:  1. UTI (lower urinary tract infection)    P: D/C home Cipro 500 mg BID x 7 days Pyridium 200 mg TID PRN Urine sent for culture Return to MAU if symptoms persist or worsen   Sharen CounterLisa Leftwich-Kirby Certified Nurse-Midwife

## 2014-06-02 LAB — URINE CULTURE

## 2014-08-08 ENCOUNTER — Emergency Department (HOSPITAL_COMMUNITY): Payer: Medicaid Other

## 2014-08-08 ENCOUNTER — Encounter (HOSPITAL_COMMUNITY): Payer: Self-pay | Admitting: Emergency Medicine

## 2014-08-08 ENCOUNTER — Emergency Department (HOSPITAL_COMMUNITY)
Admission: EM | Admit: 2014-08-08 | Discharge: 2014-08-08 | Disposition: A | Discharge status: Home / Self Care | Payer: Medicaid Other | Attending: Emergency Medicine | Admitting: Emergency Medicine

## 2014-08-08 DIAGNOSIS — Z8709 Personal history of other diseases of the respiratory system: Secondary | ICD-10-CM | POA: Insufficient documentation

## 2014-08-08 DIAGNOSIS — S298XXA Other specified injuries of thorax, initial encounter: Secondary | ICD-10-CM | POA: Insufficient documentation

## 2014-08-08 DIAGNOSIS — S6990XA Unspecified injury of unspecified wrist, hand and finger(s), initial encounter: Secondary | ICD-10-CM | POA: Insufficient documentation

## 2014-08-08 DIAGNOSIS — S0003XA Contusion of scalp, initial encounter: Secondary | ICD-10-CM | POA: Insufficient documentation

## 2014-08-08 DIAGNOSIS — S99929A Unspecified injury of unspecified foot, initial encounter: Secondary | ICD-10-CM

## 2014-08-08 DIAGNOSIS — F17200 Nicotine dependence, unspecified, uncomplicated: Secondary | ICD-10-CM | POA: Insufficient documentation

## 2014-08-08 DIAGNOSIS — S0990XA Unspecified injury of head, initial encounter: Secondary | ICD-10-CM | POA: Insufficient documentation

## 2014-08-08 DIAGNOSIS — S8990XA Unspecified injury of unspecified lower leg, initial encounter: Secondary | ICD-10-CM | POA: Insufficient documentation

## 2014-08-08 DIAGNOSIS — Z792 Long term (current) use of antibiotics: Secondary | ICD-10-CM | POA: Insufficient documentation

## 2014-08-08 DIAGNOSIS — T148XXA Other injury of unspecified body region, initial encounter: Secondary | ICD-10-CM

## 2014-08-08 DIAGNOSIS — S0083XA Contusion of other part of head, initial encounter: Principal | ICD-10-CM | POA: Insufficient documentation

## 2014-08-08 DIAGNOSIS — S1093XA Contusion of unspecified part of neck, initial encounter: Principal | ICD-10-CM

## 2014-08-08 DIAGNOSIS — Z8669 Personal history of other diseases of the nervous system and sense organs: Secondary | ICD-10-CM | POA: Insufficient documentation

## 2014-08-08 DIAGNOSIS — R0781 Pleurodynia: Secondary | ICD-10-CM

## 2014-08-08 DIAGNOSIS — S99919A Unspecified injury of unspecified ankle, initial encounter: Secondary | ICD-10-CM

## 2014-08-08 MED ORDER — HYDROCODONE-ACETAMINOPHEN 5-325 MG PO TABS: 1.0000 | ORAL_TABLET | Freq: Four times a day (QID) | ORAL | Status: DC | PRN

## 2014-08-08 MED ORDER — HYDROCODONE-ACETAMINOPHEN 5-325 MG PO TABS
2.0000 | ORAL_TABLET | Freq: Once | ORAL | Status: AC
  Administered 2014-08-08: 2 via ORAL
  Filled 2014-08-08: qty 2
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
## 2014-08-08 NOTE — Discharge Instructions (Signed)
Assault, General °Assault includes any behavior, whether intentional or reckless, which results in bodily injury to another person and/or damage to property. Included in this would be any behavior, intentional or reckless, that by its nature would be understood (interpreted) by a reasonable person as intent to harm another person or to damage his/her property. Threats may be oral or written. They may be communicated through regular mail, computer, fax, or phone. These threats may be direct or implied. °FORMS OF ASSAULT INCLUDE: °· Physically assaulting a person. This includes physical threats to inflict physical harm as well as: °¨ Slapping. °¨ Hitting. °¨ Poking. °¨ Kicking. °¨ Punching. °¨ Pushing. °· Arson. °· Sabotage. °· Equipment vandalism. °· Damaging or destroying property. °· Throwing or hitting objects. °· Displaying a weapon or an object that appears to be a weapon in a threatening manner. °¨ Carrying a firearm of any kind. °¨ Using a weapon to harm someone. °· Using greater physical size/strength to intimidate another. °¨ Making intimidating or threatening gestures. °¨ Bullying. °¨ Hazing. °· Intimidating, threatening, hostile, or abusive language directed toward another person. °¨ It communicates the intention to engage in violence against that person. And it leads a reasonable person to expect that violent behavior may occur. °· Stalking another person. °IF IT HAPPENS AGAIN: °· Immediately call for emergency help (911 in [place] °· If someone poses clear and immediate danger to you, seek legal authorities to have a protective or restraining order put in place. °· Less threatening assaults can at least be reported to authorities. °STEPS TO TAKE IF A SEXUAL ASSAULT HAS HAPPENED °· Go to an area of safety. This may include a shelter or staying with a friend. Stay away from the area where you have been attacked. A large percentage of sexual assaults are caused by a friend, relative or associate. °· If  medications were given by your caregiver, take them as directed for the full length of time prescribed. °· Only take over-the-counter or prescription medicines for pain, discomfort, or fever as directed by your caregiver. °· If you have come in contact with a sexual disease, find out if you are to be tested again. If your caregiver is concerned about the HIV/AIDS virus, he/she may require you to have continued testing for several months. °· For the protection of your privacy, test results can not be given over the phone. Make sure you receive the results of your test. If your test results are not back during your visit, make an appointment with your caregiver to find out the results. Do not assume everything is normal if you have not heard from your caregiver or the medical facility. It is important for you to follow up on all of your test results. °· File appropriate papers with authorities. This is important in all assaults, even if it has occurred in a family or by a friend. °SEEK MEDICAL CARE IF: °· You have new problems because of your injuries. °· You have problems that may be because of the medicine you are taking, such as: °¨ Rash. °¨ Itching. °¨ Swelling. °¨ Trouble breathing. °· You develop belly (abdominal) pain, feel sick to your stomach (nausea) or are vomiting. °· You begin to run a temperature. °· You need supportive care or referral to a rape crisis center. These are centers with trained personnel who can help you get through this ordeal. °SEEK IMMEDIATE MEDICAL CARE IF: °· You are afraid of being threatened, beaten, or abused. In [place] call 911. °· You   receive new injuries related to abuse.  You develop severe pain in any area injured in the assault or have any change in your condition that concerns you.  You faint or lose consciousness.  You develop chest pain or shortness of breath. Document Released: 11/13/2005 Document Revised: 02/05/2012 Document Reviewed: 07/01/2008 [facility] Patient  Information 2015 [place], [place]. This information is not intended to replace advice given to you by your health care provider. Make sure you discuss any questions you have with your health care provider.  Hematoma A hematoma is a collection of blood under the skin, in an organ, in a body space, in a joint space, or in other tissue. The blood can clot to form a lump that you can see and feel. The lump is often firm and may sometimes become sore and tender. Most hematomas get better in a few days to weeks. However, some hematomas may be serious and require medical care. Hematomas can range in size from very small to very large. CAUSES  A hematoma can be caused by a blunt or penetrating injury. It can also be caused by spontaneous leakage from a blood vessel under the skin. Spontaneous leakage from a blood vessel is more likely to occur in older people, especially those taking blood thinners. Sometimes, a hematoma can develop after certain medical procedures. SIGNS AND SYMPTOMS   A firm lump on the body.  Possible pain and tenderness in the area.  Bruising.Blue, dark blue, purple-red, or yellowish skin may appear at the site of the hematoma if the hematoma is close to the surface of the skin. For hematomas in deeper tissues or body spaces, the signs and symptoms may be subtle. For example, an intra-abdominal hematoma may cause abdominal pain, weakness, fainting, and shortness of breath. An intracranial hematoma may cause a headache or symptoms such as weakness, trouble speaking, or a change in consciousness. DIAGNOSIS  A hematoma can usually be diagnosed based on your medical history and a physical exam. Imaging tests may be needed if your health care provider suspects a hematoma in deeper tissues or body spaces, such as the abdomen, head, or chest. These tests may include ultrasonography or a CT scan.  TREATMENT  Hematomas usually go away on their own over time. Rarely does the blood need to be  drained out of the body. Large hematomas or those that may affect vital organs will sometimes need surgical drainage or monitoring. HOME CARE INSTRUCTIONS   Apply ice to the injured area:   Put ice in a plastic bag.   Place a towel between your skin and the bag.   Leave the ice on for 20 minutes, 2-3 times a day for the first 1 to 2 days.   After the first 2 days, switch to using warm compresses on the hematoma.   Elevate the injured area to help decrease pain and swelling. Wrapping the area with an elastic bandage may also be helpful. Compression helps to reduce swelling and promotes shrinking of the hematoma. Make sure the bandage is not wrapped too tight.   If your hematoma is on a lower extremity and is painful, crutches may be helpful for a couple days.   Only take over-the-counter or prescription medicines as directed by your health care provider. SEEK IMMEDIATE MEDICAL CARE IF:   You have increasing pain, or your pain is not controlled with medicine.   You have a fever.   You have worsening swelling or discoloration.   Your skin over the hematoma  breaks or starts bleeding.   Your hematoma is in your chest or abdomen and you have weakness, shortness of breath, or a change in consciousness.  Your hematoma is on your scalp (caused by a fall or injury) and you have a worsening headache or a change in alertness or consciousness. MAKE SURE YOU:   Understand these instructions.  Will watch your condition.  Will get help right away if you are not doing well or get worse. Document Released: 06/27/2004 Document Revised: 07/16/2013 Document Reviewed: 04/23/2013 [facility] Patient Information 2015 [place], [place]. This information is not intended to replace advice given to you by your health care provider. Make sure you discuss any questions you have with your health care provider.

## 2014-08-08 NOTE — ED Notes (Signed)
Patient transported to X-ray 

## 2014-08-08 NOTE — ED Notes (Signed)
Patient returned from X-ray 

## 2014-08-08 NOTE — ED Notes (Signed)
Emergency planning/management officer at bedside for interview.
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
## 2014-08-12 NOTE — ED Provider Notes (Signed)
Medical screening examination/treatment/procedure(s) were performed by non-physician practitioner and as supervising physician I was immediately available for consultation/collaboration.   EKG Interpretation None        Samuel Jester, DO 08/12/14 [phone]

## 2014-08-14 ENCOUNTER — Inpatient Hospital Stay (HOSPITAL_COMMUNITY)
Admission: AD | Admit: 2014-08-14 | Discharge: 2014-08-14 | Disposition: A | Discharge status: Home / Self Care | Payer: Self-pay | Source: Ambulatory Visit | Attending: Obstetrics and Gynecology | Admitting: Obstetrics and Gynecology

## 2014-08-14 ENCOUNTER — Encounter (HOSPITAL_COMMUNITY): Payer: Self-pay | Admitting: *Deleted

## 2014-08-14 DIAGNOSIS — N760 Acute vaginitis: Secondary | ICD-10-CM

## 2014-08-14 DIAGNOSIS — O2390 Unspecified genitourinary tract infection in pregnancy, unspecified trimester: Secondary | ICD-10-CM

## 2014-08-14 DIAGNOSIS — Z3202 Encounter for pregnancy test, result negative: Secondary | ICD-10-CM | POA: Insufficient documentation

## 2014-08-14 DIAGNOSIS — F17200 Nicotine dependence, unspecified, uncomplicated: Secondary | ICD-10-CM | POA: Insufficient documentation

## 2014-08-14 DIAGNOSIS — B9689 Other specified bacterial agents as the cause of diseases classified elsewhere: Secondary | ICD-10-CM | POA: Insufficient documentation

## 2014-08-14 DIAGNOSIS — A499 Bacterial infection, unspecified: Secondary | ICD-10-CM

## 2014-08-14 LAB — URINALYSIS, ROUTINE W REFLEX MICROSCOPIC
BILIRUBIN URINE: NEGATIVE
Glucose, UA: NEGATIVE mg/dL
HGB URINE DIPSTICK: NEGATIVE
Ketones, ur: NEGATIVE mg/dL
Leukocytes, UA: NEGATIVE
Nitrite: NEGATIVE
Protein, ur: NEGATIVE mg/dL
Specific Gravity, Urine: 1.02 (ref 1.005–1.030)
UROBILINOGEN UA: 0.2 mg/dL (ref 0.0–1.0)
pH: 6.5 (ref 5.0–8.0)

## 2014-08-14 LAB — POCT PREGNANCY, URINE: Preg Test, Ur: NEGATIVE

## 2014-08-14 LAB — WET PREP, GENITAL
TRICH WET PREP: NONE SEEN
Yeast Wet Prep HPF POC: NONE SEEN

## 2014-08-14 LAB — RPR

## 2014-08-14 MED ORDER — METRONIDAZOLE 500 MG PO TABS: 500.0000 mg | ORAL_TABLET | Freq: Two times a day (BID) | ORAL | Status: DC

## 2014-08-14 NOTE — MAU Provider Note (Signed)
Attestation of Attending Supervision of Advanced Practitioner (CNM/NP): Evaluation and management procedures were performed by the Advanced Practitioner under my supervision and collaboration.  I have reviewed the Advanced Practitioner's note and chart, and I agree with the management and plan.  Emit Kuenzel 08/14/2014 5:47 PM

## 2014-08-14 NOTE — Discharge Instructions (Signed)
Bacterial Vaginosis Bacterial vaginosis is an infection of the vagina. It happens when too many of certain germs (bacteria) grow in the vagina. HOME CARE  Take your medicine as told by your doctor.  Finish your medicine even if you start to feel better.  Do not have sex until you finish your medicine and are better.  Tell your sex partner that you have an infection. They should see their doctor for treatment.  Practice safe sex. Use condoms. Have only one sex partner. GET HELP IF:  You are not getting better after 3 days of treatment.  You have more grey fluid (discharge) coming from your vagina than before.  You have more pain than before.  You have a fever. MAKE SURE YOU:   Understand these instructions.  Will watch your condition.  Will get help right away if you are not doing well or get worse. Document Released: 08/22/2008 Document Revised: 09/03/2013 Document Reviewed: 06/25/2013 [employer] Patient Information 2015 [employer]. This information is not intended to replace advice given to you by your health care provider. Make sure you discuss any questions you have with your health care provider.  

## 2014-08-14 NOTE — MAU Note (Signed)
Patient states she has been having lower abdominal pain and a milky vaginal discharge with an odor for 2 days. Denies nausea, vomiting or bleeding.

## 2014-08-14 NOTE — MAU Provider Note (Signed)
History     CSN: [id number]  Arrival date and time: 08/14/14 4696   First Provider Initiated Contact with Patient 08/14/14 1045      Chief Complaint  Patient presents with  . Abdominal Pain  . Vaginal Discharge   HPI Linda Khan 18 y.o. nonpregnant female presents to MAU for evaluation of abdominal pain, vaginal discharge with odor for 2 days.  She reports no prior history of these symptoms.  She has had previous trichomonas infection - last year.  No other history of STD.    Last sex was this morning without a condom.  She does not use contraception and is seeking pregnancy.  LMP 07/25/14.  She denies nausea, vomiting, vaginal bleeding, dysuria.  Never been pregnant  Past Medical History  Diagnosis Date  . Bell's palsy   . Bronchitis     History reviewed. No pertinent past surgical history.  History reviewed. No pertinent family history.  History  Substance Use Topics  . Smoking status: Current Every Day Smoker -- 0.50 packs/day    Types: Cigarettes  . Smokeless tobacco: Not on file  . Alcohol Use: Yes     Comment: occasionally; last night    Allergies: No Known Allergies  Prescriptions prior to admission  Medication Sig Dispense Refill  . HYDROcodone-acetaminophen (NORCO/VICODIN) 5-325 MG per tablet Take 1-2 tablets by mouth every 6 (six) hours as needed.  20 tablet  0    Review of Systems  Constitutional: Positive for diaphoresis. Negative for fever and chills.  HENT: Negative for congestion and sore throat.   Respiratory: Negative for cough, shortness of breath and wheezing.   Cardiovascular: Negative for chest pain and palpitations.  Gastrointestinal: Positive for abdominal pain. Negative for heartburn, nausea, vomiting, diarrhea, constipation and blood in stool.  Genitourinary: Negative for dysuria, frequency and hematuria.  Skin: Negative for itching and rash.  Neurological: Positive for headaches. Negative for dizziness, tingling and weakness.   Psychiatric/Behavioral: Negative for depression, suicidal ideas and substance abuse. The patient is not nervous/anxious.    Physical Exam   Blood pressure 111/67, pulse 65, temperature 98.9 F (37.2 C), temperature source Oral, resp. rate 16, height 5' 4.5" (1.638 m), weight 113 lb 3.2 oz (51.347 kg), last menstrual period 07/25/2014, SpO2 100.00%.  Physical Exam  Constitutional: She is oriented to person, place, and time. She appears well-developed and well-nourished. No distress.  HENT:  Head: Normocephalic and atraumatic.  Eyes: EOM are normal.  Neck: Normal range of motion.  Cardiovascular: Normal rate, regular rhythm and normal heart sounds.   Respiratory: Effort normal and breath sounds normal. No respiratory distress.  GI: Soft. Bowel sounds are normal. She exhibits no distension. There is no tenderness.  Genitourinary:  Vaginal mucosa is pink.  Minimal clear discharge present Clear mucus present in cervical os.  Cervix is pink, smooth without erythema. No CMT/No adnexal tenderness/mass  Musculoskeletal: Normal range of motion.  Neurological: She is alert and oriented to person, place, and time.  Skin: Skin is warm and dry.  Psychiatric: She has a normal mood and affect.   Results for orders placed during the hospital encounter of 08/14/14 (from the past 24 hour(s))  URINALYSIS, ROUTINE W REFLEX MICROSCOPIC     Status: None   Collection Time    08/14/14 10:14 AM      Result Value Ref Range   Color, Urine YELLOW  YELLOW   APPearance CLEAR  CLEAR   Specific Gravity, Urine 1.020  1.005 - 1.030  pH 6.5  5.0 - 8.0   Glucose, UA NEGATIVE  NEGATIVE mg/dL   Hgb urine dipstick NEGATIVE  NEGATIVE   Bilirubin Urine NEGATIVE  NEGATIVE   Ketones, ur NEGATIVE  NEGATIVE mg/dL   Protein, ur NEGATIVE  NEGATIVE mg/dL   Urobilinogen, UA 0.2  0.0 - 1.0 mg/dL   Nitrite NEGATIVE  NEGATIVE   Leukocytes, UA NEGATIVE  NEGATIVE  POCT PREGNANCY, URINE     Status: None   Collection Time     08/14/14 10:15 AM      Result Value Ref Range   Preg Test, Ur NEGATIVE  NEGATIVE  WET PREP, GENITAL     Status: Abnormal   Collection Time    08/14/14 10:58 AM      Result Value Ref Range   Yeast Wet Prep HPF POC NONE SEEN  NONE SEEN   Trich, Wet Prep NONE SEEN  NONE SEEN   Clue Cells Wet Prep HPF POC FEW (*) NONE SEEN   WBC, Wet Prep HPF POC MANY (*) NONE SEEN    MAU Course  Procedures  MDM Presumptive infection of Bacterial Vaginosis based on symptoms and wet prep results.  No other source of infection identified.  Negative Pregnancy Test  Assessment and Plan  A: Bacterial Vaginosis  P: Discharge to home  Flagyl x 1 week No sex/no etoh x 10 days MAU for emergency  Bertram Denver 08/14/2014, 10:49 AM

## 2014-08-15 LAB — GC/CHLAMYDIA PROBE AMP
CT Probe RNA: POSITIVE — AB
GC Probe RNA: NEGATIVE

## 2014-08-15 LAB — HIV ANTIBODY (ROUTINE TESTING W REFLEX): HIV 1&2 Ab, 4th Generation: NONREACTIVE

## 2014-08-17 ENCOUNTER — Telehealth: Payer: Self-pay | Admitting: General Practice

## 2014-08-17 DIAGNOSIS — A749 Chlamydial infection, unspecified: Secondary | ICD-10-CM

## 2014-08-17 MED ORDER — AZITHROMYCIN 250 MG PO TABS: 1000.0000 mg | ORAL_TABLET | Freq: Once | ORAL | Status: DC

## 2014-08-17 NOTE — Telephone Encounter (Signed)
Message copied by Kathee Delton on Mon Aug 17, 2014 10:53 AM ------      Message from: [place]      Created: Mon Aug 17, 2014  8:57 AM       Telephone call to patient regarding positive chlamydia culture, patient notified.  Patient has not been treated and will need Rx called in per protocol to [facility] [address]  Instructed patient to notify her partner for treatment.  Report faxed to health department. ------

## 2014-08-17 NOTE — Telephone Encounter (Signed)
Called patient and informed her of medication sent to pharmacy. Patient verbalized understanding and had no other questions  

## 2014-10-21 ENCOUNTER — Encounter (HOSPITAL_COMMUNITY): Payer: Self-pay | Admitting: *Deleted

## 2014-10-21 ENCOUNTER — Emergency Department (HOSPITAL_COMMUNITY)
Admission: EM | Admit: 2014-10-21 | Discharge: 2014-10-21 | Disposition: A | Discharge status: Home / Self Care | Payer: Medicaid Other | Attending: Emergency Medicine | Admitting: Emergency Medicine

## 2014-10-21 ENCOUNTER — Emergency Department (HOSPITAL_COMMUNITY): Payer: Medicaid Other

## 2014-10-21 DIAGNOSIS — J069 Acute upper respiratory infection, unspecified: Secondary | ICD-10-CM | POA: Insufficient documentation

## 2014-10-21 DIAGNOSIS — Z3202 Encounter for pregnancy test, result negative: Secondary | ICD-10-CM | POA: Insufficient documentation

## 2014-10-21 DIAGNOSIS — R1110 Vomiting, unspecified: Secondary | ICD-10-CM | POA: Insufficient documentation

## 2014-10-21 DIAGNOSIS — M549 Dorsalgia, unspecified: Secondary | ICD-10-CM | POA: Insufficient documentation

## 2014-10-21 DIAGNOSIS — B9789 Other viral agents as the cause of diseases classified elsewhere: Secondary | ICD-10-CM

## 2014-10-21 DIAGNOSIS — R059 Cough, unspecified: Secondary | ICD-10-CM

## 2014-10-21 DIAGNOSIS — Z792 Long term (current) use of antibiotics: Secondary | ICD-10-CM | POA: Insufficient documentation

## 2014-10-21 DIAGNOSIS — Z720 Tobacco use: Secondary | ICD-10-CM | POA: Insufficient documentation

## 2014-10-21 DIAGNOSIS — J988 Other specified respiratory disorders: Secondary | ICD-10-CM

## 2014-10-21 DIAGNOSIS — R05 Cough: Secondary | ICD-10-CM

## 2014-10-21 DIAGNOSIS — H578 Other specified disorders of eye and adnexa: Secondary | ICD-10-CM | POA: Insufficient documentation

## 2014-10-21 LAB — POC URINE PREG, ED: Preg Test, Ur: NEGATIVE

## 2014-10-21 LAB — RAPID STREP SCREEN (MED CTR MEBANE ONLY): Streptococcus, Group A Screen (Direct): NEGATIVE

## 2014-10-21 MED ORDER — IBUPROFEN 800 MG PO TABS: 800.0000 mg | ORAL_TABLET | Freq: Three times a day (TID) | ORAL | Status: DC | PRN

## 2014-10-21 MED ORDER — HYDROCOD POLST-CHLORPHEN POLST 10-8 MG/5ML PO LQCR: 5.0000 mL | Freq: Two times a day (BID) | ORAL | Status: DC | PRN

## 2014-10-21 NOTE — ED Notes (Signed)
C/o sorethroat , back pain and statesw she blew her nose this am and had blood and mucus

## 2014-10-21 NOTE — ED Notes (Signed)
Pt made aware to return if symptoms worsen or if any life threatening symptoms occur.   

## 2014-10-21 NOTE — Discharge Instructions (Signed)
Read the information below.  Use the prescribed medication as directed.  Please discuss all new medications with your pharmacist.  You may return to the Emergency Department at any time for worsening condition or any new symptoms that concern you.  If you develop high fevers that do not resolve with tylenol or ibuprofen, you have difficulty swallowing or breathing, or you are unable to tolerate fluids by mouth, return to the ER for a recheck.    ° ° °Viral Infections °A virus is a type of germ. Viruses can cause: °· Minor sore throats. °· Aches and pains. °· Headaches. °· Runny nose. °· Rashes. °· Watery eyes. °· Tiredness. °· Coughs. °· Loss of appetite. °· Feeling sick to your stomach (nausea). °· Throwing up (vomiting). °· Watery poop (diarrhea). °HOME CARE  °· Only take medicines as told by your doctor. °· Drink enough water and fluids to keep your pee (urine) clear or pale yellow. Sports drinks are a good choice. °· Get plenty of rest and eat healthy. Soups and broths with crackers or rice are fine. °GET HELP RIGHT AWAY IF:  °· You have a very bad headache. °· You have shortness of breath. °· You have chest pain or neck pain. °· You have an unusual rash. °· You cannot stop throwing up. °· You have watery poop that does not stop. °· You cannot keep fluids down. °· You or your child has a temperature by mouth above 102° F (38.9° C), not controlled by medicine. °· Your baby is older than 3 months with a rectal temperature of 102° F (38.9° C) or higher. °· Your baby is 3 months old or younger with a rectal temperature of 100.4° F (38° C) or higher. °MAKE SURE YOU:  °· Understand these instructions. °· Will watch this condition. °· Will get help right away if you are not doing well or get worse. °Document Released: 10/26/2008 Document Revised: 02/05/2012 Document Reviewed: 03/21/2011 °ExitCare® Patient Information ©2015 [employer]. This information is not intended to replace advice given to you by your health  care provider. Make sure you discuss any questions you have with your health care provider. ° °

## 2014-10-21 NOTE — ED Provider Notes (Signed)
CSN: [id number]     Arrival date & time 10/21/14  [id number] History  This chart was scribed for non-physician practitioner working with Hurman HornJohn M Bednar, MD, by Jarvis Morganaylor Ferguson, ED Scribe. This patient was seen in room TR04C/TR04C and the patient's care was started at 10:29 AM.   Chief Complaint  Patient presents with  . Sore Throat  . Back Pain    The history is provided by the patient. No language interpreter was used.    HPI Comments: Linda Khan is a 18 y.o. female who presents to the Emergency Department complaining of a productive cough for 3 days. She states that has been a few instances of blood in her mucus that she coughed up but other times it has been green and "brownish". She is having an associated aching, "8/10", sore throat, possible pink eye, 1x vomiting, chest wall tenderness, subjective fever, and back pain. She states that her left eye is having associated redness and yellow and green discharge. She is also complaining that her left eye is itching. She has taken Halls with minimal relief. She states her back pain is exacerbated by palpation and coughing. She also notes her sore throat is exacerbated by swallowing and trying to eat. Her LNMP was October 5th, 2015. She does not think she could be pregnant. She denies any urinary symptoms, vaginal discharge, abdominal pain, diarrhea, shortness of breath, or myalgias.  Past Medical History  Diagnosis Date  . Bell's palsy   . Bronchitis    No past surgical history on file. No family history on file. History  Substance Use Topics  . Smoking status: Current Every Day Smoker -- 0.50 packs/day    Types: Cigarettes  . Smokeless tobacco: Not on file  . Alcohol Use: Yes     Comment: occasionally; last night   OB History    No data available     Review of Systems  Constitutional: Positive for fever (subjective).  HENT: Positive for congestion, rhinorrhea, sneezing, sore throat and trouble swallowing (due to sore throat).    Eyes: Positive for discharge, redness and itching.  Respiratory: Positive for cough (hemoptysis other times productive of green sputum). Negative for shortness of breath.        + chest wall tenderness  Cardiovascular: Negative for chest pain.  Gastrointestinal: Positive for vomiting (1x last night). Negative for nausea, abdominal pain and diarrhea.  Genitourinary: Negative for dysuria, frequency, hematuria, vaginal bleeding, vaginal discharge and difficulty urinating.  Musculoskeletal: Positive for back pain (due to cough). Negative for joint swelling and arthralgias.  All other systems reviewed and are negative.     Allergies  Review of patient's allergies indicates no known allergies.  Home Medications   Prior to Admission medications   Medication Sig Start Date End Date Taking? Authorizing Provider  azithromycin (ZITHROMAX) 250 MG tablet Take 4 tablets (1,000 mg total) by mouth once. 08/17/14   Adam PhenixJames G Arnold, MD  HYDROcodone-acetaminophen (NORCO/VICODIN) 5-325 MG per tablet Take 1 tablet by mouth every 6 (six) hours as needed for moderate pain.    Historical Provider, MD  ibuprofen (ADVIL,MOTRIN) 600 MG tablet Take 600 mg by mouth daily as needed for mild pain or moderate pain.    Historical Provider, MD  metroNIDAZOLE (FLAGYL) 500 MG tablet Take 1 tablet (500 mg total) by mouth 2 (two) times daily. 08/14/14   Bertram DenverKaren E Teague Clark, PA-C   Triage Vitals: BP 108/60 mmHg  Temp(Src) 98.7 F (37.1 C) (Oral)  Resp 18  Ht  5\' 3"  (1.6 m)  Wt 113 lb (51.256 kg)  BMI 20.02 kg/m2  SpO2 99%   Physical Exam  Constitutional: She appears well-developed and well-nourished. No distress.  HENT:  Head: Normocephalic and atraumatic.  Right Ear: Tympanic membrane and ear canal normal.  Left Ear: Tympanic membrane and ear canal normal.  Nose: Right sinus exhibits no maxillary sinus tenderness and no frontal sinus tenderness. Left sinus exhibits no maxillary sinus tenderness and no frontal sinus  tenderness.  Mouth/Throat: Uvula is midline. Posterior oropharyngeal erythema present. No oropharyngeal exudate or posterior oropharyngeal edema.   Erythema in turbinates.    Eyes: EOM and lids are normal. Pupils are equal, round, and reactive to light. Right eye exhibits no discharge. Left eye exhibits no discharge. Right conjunctiva is not injected. Right conjunctiva has no hemorrhage. Left conjunctiva is injected. Left conjunctiva has no hemorrhage. No scleral icterus.  Left eye mildly injected  Neck: Normal range of motion. Neck supple.  Cardiovascular: Normal rate and regular rhythm.   Pulmonary/Chest: Effort normal and breath sounds normal. No stridor. No respiratory distress. She has no wheezes. She has no rales.  Abdominal: Soft. She exhibits no distension. There is no tenderness. There is no rebound.  Musculoskeletal: Normal range of motion. She exhibits no edema.  Lymphadenopathy:    She has cervical adenopathy.  Neurological: She is alert.  Skin: She is not diaphoretic.  Nursing note and vitals reviewed.   ED Course  Procedures (including critical care time)  DIAGNOSTIC STUDIES: Oxygen Saturation is 99% on RA, normal by my interpretation.    COORDINATION OF CARE: 10:38 AM- Will order U preg, rapid strep screen and CXR.  Pt advised of plan for treatment and pt agrees.   Labs Review Labs Reviewed  RAPID STREP SCREEN  CULTURE, GROUP A STREP  POC URINE PREG, ED    Imaging Review Dg Chest 2 View  10/21/2014   CLINICAL DATA:  Cough.  EXAM: CHEST  2 VIEW  COMPARISON:  August 08, 2014.  FINDINGS: The heart size and mediastinal contours are within normal limits. Both lungs are clear. No pneumothorax or pleural effusion is noted. The visualized skeletal structures are unremarkable.  IMPRESSION: No acute cardiopulmonary abnormality seen.   Electronically Signed   By: Roque LiasJames  Green M.D.   On: 10/21/2014 11:22     EKG Interpretation None      MDM   Final diagnoses:   Cough  Viral respiratory illness    Afebrile, nontoxic patient with constellation of symptoms suggestive of viral syndrome.  Pt noting slight bloody tinge of sputum, likely from viral illness - CXR, strep negative.  Doubt PE. Pt is not pregnant.  No concerning findings on exam.  Discharged home with supportive care, PCP follow up.  Discussed result, findings, treatment, and follow up  with patient.  Pt given return precautions.  Pt verbalizes understanding and agrees with plan.      I personally performed the services described in this documentation, which was scribed in my presence. The recorded information has been reviewed and is accurate.    Trixie Dredgemily Lariah Fleer, PA-C 10/21/14 1159  Hurman HornJohn M Bednar, MD 10/22/14 [phone]

## 2014-10-23 LAB — CULTURE, GROUP A STREP

## 2014-11-24 ENCOUNTER — Emergency Department (HOSPITAL_COMMUNITY)
Admission: EM | Admit: 2014-11-24 | Discharge: 2014-11-24 | Disposition: A | Discharge status: Home / Self Care | Payer: Medicaid Other | Attending: Emergency Medicine | Admitting: Emergency Medicine

## 2014-11-24 ENCOUNTER — Encounter (HOSPITAL_COMMUNITY): Payer: Self-pay | Admitting: *Deleted

## 2014-11-24 DIAGNOSIS — N898 Other specified noninflammatory disorders of vagina: Secondary | ICD-10-CM

## 2014-11-24 DIAGNOSIS — Z8669 Personal history of other diseases of the nervous system and sense organs: Secondary | ICD-10-CM | POA: Insufficient documentation

## 2014-11-24 DIAGNOSIS — Z3202 Encounter for pregnancy test, result negative: Secondary | ICD-10-CM | POA: Insufficient documentation

## 2014-11-24 DIAGNOSIS — N760 Acute vaginitis: Secondary | ICD-10-CM | POA: Insufficient documentation

## 2014-11-24 DIAGNOSIS — Z792 Long term (current) use of antibiotics: Secondary | ICD-10-CM | POA: Insufficient documentation

## 2014-11-24 DIAGNOSIS — Z8709 Personal history of other diseases of the respiratory system: Secondary | ICD-10-CM | POA: Insufficient documentation

## 2014-11-24 DIAGNOSIS — B9689 Other specified bacterial agents as the cause of diseases classified elsewhere: Secondary | ICD-10-CM

## 2014-11-24 DIAGNOSIS — Z720 Tobacco use: Secondary | ICD-10-CM | POA: Insufficient documentation

## 2014-11-24 LAB — URINALYSIS, ROUTINE W REFLEX MICROSCOPIC
Glucose, UA: NEGATIVE mg/dL
Hgb urine dipstick: NEGATIVE
Ketones, ur: >80 mg/dL — AB
NITRITE: NEGATIVE
Protein, ur: NEGATIVE mg/dL
SPECIFIC GRAVITY, URINE: 1.036 — AB (ref 1.005–1.030)
UROBILINOGEN UA: 0.2 mg/dL (ref 0.0–1.0)
pH: 5.5 (ref 5.0–8.0)

## 2014-11-24 LAB — WET PREP, GENITAL
Trich, Wet Prep: NONE SEEN
Yeast Wet Prep HPF POC: NONE SEEN

## 2014-11-24 LAB — URINE MICROSCOPIC-ADD ON

## 2014-11-24 LAB — POC URINE PREG, ED: Preg Test, Ur: NEGATIVE

## 2014-11-24 MED ORDER — METRONIDAZOLE 500 MG PO TABS: 500.0000 mg | ORAL_TABLET | Freq: Two times a day (BID) | ORAL | Status: DC

## 2014-11-24 MED ORDER — CEFTRIAXONE SODIUM 250 MG IJ SOLR
250.0000 mg | Freq: Once | INTRAMUSCULAR | Status: AC
  Administered 2014-11-24: 250 mg via INTRAMUSCULAR
  Filled 2014-11-24: qty 250

## 2014-11-24 MED ORDER — ONDANSETRON 4 MG PO TBDP
4.0000 mg | ORAL_TABLET | Freq: Once | ORAL | Status: AC
  Administered 2014-11-24: 4 mg via ORAL
  Filled 2014-11-24: qty 1

## 2014-11-24 MED ORDER — AZITHROMYCIN 250 MG PO TABS
1000.0000 mg | ORAL_TABLET | Freq: Once | ORAL | Status: AC
  Administered 2014-11-24: 1000 mg via ORAL
  Filled 2014-11-24: qty 4

## 2014-11-24 MED ORDER — LIDOCAINE HCL (PF) 1 % IJ SOLN
5.0000 mL | Freq: Once | INTRAMUSCULAR | Status: AC
  Administered 2014-11-24: 5 mL
  Filled 2014-11-24: qty 5

## 2014-11-24 NOTE — Discharge Instructions (Signed)
Please read and follow all provided instructions.  Your diagnoses today include:  1. Vaginal discharge   2. Bacterial vaginosis     Tests performed today include:  Test for gonorrhea and chlamydia. You will be notified by telephone if you have a positive result.  Wet prep - suggests signs of bacterial vaginosis  Vital signs. See below for your results today.   Medications:  You were treated for chlamydia (1 gram azithromycin pills) and gonorrhea (250mg  rocephin shot).   Metronidazole - antibiotic  You have been prescribed an antibiotic medicine: take the entire course of medicine even if you are feeling better. Stopping early can cause the antibiotic not to work. Do not drink alcohol when taking this medication.   Home care instructions:  Read educational materials contained in this packet and follow any instructions provided.   You should tell your partners about your infection and avoid having sex for one week to allow time for the medicine to work.  Follow-up instructions: You should [facility] to be tested for HIV, syphilis, and hepatitis -- all of which can be transmitted by sexual contact. We do not routinely screen for these in the Emergency Department.  STD Testing:  [facility] [place] [place] [facility]  [address], [place], phone [phone] or [phone]    Monday - Friday, call for an appointment  [facility] [place] [place] [facility]  [address], [place], phone [phone] or [phone]   Monday - Friday, call for an appointment  Return instructions:   Please return to the Emergency Department if you experience worsening symptoms.   Please return if you have any other emergent concerns.  Additional Information:  Your vital signs today were: BP 125/64 mmHg   Pulse 93   Temp(Src) 98.1 F (36.7 C) (Oral)   Ht 5\' 3"  (1.6 m)   Wt 112 lb  (50.803 kg)   BMI 19.84 kg/m2   SpO2 100%   LMP 09/27/2014 If your blood pressure (BP) was elevated above 135/85 this visit, please have this repeated by your doctor within one month. --------------

## 2014-11-24 NOTE — ED Provider Notes (Signed)
CSN: [id number]     Arrival date & time 11/24/14  1031 History   First MD Initiated Contact with Patient 11/24/14 1133     Chief Complaint  Patient presents with  . Vaginal Discharge     (Consider location/radiation/quality/duration/timing/severity/associated sxs/prior Treatment) HPI Comments: Patient presents with complaint of vaginal discharge and she states that she thinks that she has Chlamydia. She has had this in the past and the symptoms are the same. She has not had any dysuria, diarrhea, or constipation. No sore throat or fever. She admits to unprotected sexual intercourse with one partner. She is concerned about STI. No treatments prior to arrival. Symptoms have been occurring for approximately one week.  Patient is a 18 y.o. female presenting with vaginal discharge. The history is provided by the patient.  Vaginal Discharge Associated symptoms: no dysuria and no fever     Past Medical History  Diagnosis Date  . Bell's palsy   . Bronchitis    History reviewed. No pertinent past surgical history. History reviewed. No pertinent family history. History  Substance Use Topics  . Smoking status: Current Every Day Smoker -- 0.50 packs/day    Types: Cigarettes  . Smokeless tobacco: Not on file  . Alcohol Use: Yes     Comment: occasionally; last night   OB History    No data available     Review of Systems  Constitutional: Negative for fever.  HENT: Negative for sore throat.   Eyes: Negative for discharge.  Gastrointestinal: Negative for rectal pain.  Genitourinary: Positive for vaginal discharge. Negative for dysuria, frequency, vaginal bleeding, genital sores and pelvic pain.  Musculoskeletal: Negative for arthralgias.  Skin: Negative for rash.  Hematological: Negative for adenopathy.      Allergies  Review of patient's allergies indicates no known allergies.  Home Medications   Prior to Admission medications   Medication Sig Start Date End Date Taking?  Authorizing Provider  azithromycin (ZITHROMAX) 250 MG tablet Take 4 tablets (1,000 mg total) by mouth once. 08/17/14   Adam PhenixJames G Arnold, MD  chlorpheniramine-HYDROcodone (TUSSIONEX PENNKINETIC ER) 10-8 MG/5ML LQCR Take 5 mLs by mouth every 12 (twelve) hours as needed for cough (and pain). 10/21/14   Trixie DredgeEmily West, PA-C  HYDROcodone-acetaminophen (NORCO/VICODIN) 5-325 MG per tablet Take 1 tablet by mouth every 6 (six) hours as needed for moderate pain.    Historical Provider, MD  ibuprofen (ADVIL,MOTRIN) 800 MG tablet Take 1 tablet (800 mg total) by mouth every 8 (eight) hours as needed for mild pain or moderate pain. 10/21/14   Trixie DredgeEmily West, PA-C  metroNIDAZOLE (FLAGYL) 500 MG tablet Take 1 tablet (500 mg total) by mouth 2 (two) times daily. 08/14/14   Scot JunKaren E Teague Clark, PA-C   BP 125/64 mmHg  Pulse 93  Temp(Src) 98.1 F (36.7 C) (Oral)  Ht 5\' 3"  (1.6 m)  Wt 112 lb (50.803 kg)  BMI 19.84 kg/m2  SpO2 100%  LMP 09/27/2014   Physical Exam  Constitutional: She appears well-developed and well-nourished.  HENT:  Head: Normocephalic and atraumatic.  Eyes: Conjunctivae are normal.  Neck: Normal range of motion. Neck supple.  Pulmonary/Chest: No respiratory distress.  Abdominal: Soft. Bowel sounds are normal. She exhibits no distension. There is no tenderness. There is no rebound.  Genitourinary: There is no rash or tenderness on the right labia. There is no rash or tenderness on the left labia. Cervix exhibits no motion tenderness, no discharge and no friability. Right adnexum displays no mass, no tenderness and no fullness.  Left adnexum displays no mass, no tenderness and no fullness. No erythema, tenderness or bleeding in the vagina. No foreign body around the vagina. No signs of injury around the vagina. Vaginal discharge found.  Neurological: She is alert.  Skin: Skin is warm and dry.  Psychiatric: She has a normal mood and affect.  Nursing note and vitals reviewed.   ED Course  Procedures  (including critical care time) Labs Review Labs Reviewed  WET PREP, GENITAL - Abnormal; Notable for the following:    Clue Cells Wet Prep HPF POC MODERATE (*)    WBC, Wet Prep HPF POC TOO NUMEROUS TO COUNT (*)    All other components within normal limits  URINALYSIS, ROUTINE W REFLEX MICROSCOPIC - Abnormal; Notable for the following:    Specific Gravity, Urine 1.036 (*)    Bilirubin Urine SMALL (*)    Ketones, ur >80 (*)    Leukocytes, UA SMALL (*)    All other components within normal limits  URINE MICROSCOPIC-ADD ON - Abnormal; Notable for the following:    Squamous Epithelial / LPF FEW (*)    Bacteria, UA FEW (*)    All other components within normal limits  GC/CHLAMYDIA PROBE AMP  POC URINE PREG, ED    Imaging Review No results found.   EKG Interpretation None      11:46 AM Patient seen and examined. Will perform pelvic exam. Will proceed with treatment for GC/chlamydia. Patient notes that she had recent test for HIV.  Vital signs reviewed and are as follows: BP 125/64 mmHg  Pulse 93  Temp(Src) 98.1 F (36.7 C) (Oral)  Ht 5\' 3"  (1.6 m)  Wt 112 lb (50.803 kg)  BMI 19.84 kg/m2  SpO2 100%  LMP 09/27/2014  1:18 PM Pelvic exam performed with nurse chaperone Lequita Halt(Morgan).  Will test and treat for STD exposure. Patient counseled on safe sexual practices. Told them that they should not have sexual contact for next 7 days and that they need to inform sexual partners so that they can get tested and treated as well. Patient verbalizes understanding and agrees with plan.     MDM   Final diagnoses:  Vaginal discharge  Bacterial vaginosis   Vaginal discharge/concern for STI: treated for GC/chlamydia. Patient counseled on safe sexual practices. She states her partner is coming in to get checked and treated.   BV: Moderate clue cells on wet prep. As this is on differential for vaginal discharge, will treat as well.  No dangerous or life-threatening conditions suspected or  identified by history, physical exam, and by work-up. No indications for hospitalization identified.      Renne CriglerJoshua Kennetta Pavlovic, PA-C 11/24/14 1319  Purvis SheffieldForrest Harrison, MD 11/24/14 [phone]

## 2014-11-24 NOTE — ED Notes (Signed)
C/o vaginal discharge onset 1 weeks ago states she has been having unprotected sex.

## 2014-11-24 NOTE — ED Notes (Signed)
States is having a yellow vaginal discharge for approx 1 week after having unprotected sex.

## 2014-11-24 NOTE — ED Notes (Signed)
Pt in room D36

## 2014-11-24 NOTE — ED Notes (Signed)
Pelvic cart set up at bedside  

## 2014-11-25 LAB — GC/CHLAMYDIA PROBE AMP
CT Probe RNA: POSITIVE — AB
GC Probe RNA: POSITIVE — AB

## 2014-11-26 ENCOUNTER — Telehealth (HOSPITAL_BASED_OUTPATIENT_CLINIC_OR_DEPARTMENT_OTHER): Payer: Self-pay | Admitting: *Deleted

## 2014-12-09 ENCOUNTER — Telehealth (HOSPITAL_COMMUNITY): Payer: Self-pay

## 2014-12-09 NOTE — ED Notes (Signed)
Unable to contact pt by mail or telephone. Unable to communicate lab results or treatment changes. 

## 2015-05-27 ENCOUNTER — Inpatient Hospital Stay (HOSPITAL_COMMUNITY)
Admission: AD | Admit: 2015-05-27 | Discharge: 2015-05-27 | Disposition: A | Discharge status: Home / Self Care | Payer: Self-pay | Source: Ambulatory Visit | Attending: Obstetrics & Gynecology | Admitting: Obstetrics & Gynecology

## 2015-05-27 DIAGNOSIS — F17210 Nicotine dependence, cigarettes, uncomplicated: Secondary | ICD-10-CM | POA: Insufficient documentation

## 2015-05-27 DIAGNOSIS — Z3202 Encounter for pregnancy test, result negative: Secondary | ICD-10-CM | POA: Insufficient documentation

## 2015-05-27 DIAGNOSIS — N912 Amenorrhea, unspecified: Secondary | ICD-10-CM | POA: Insufficient documentation

## 2015-05-27 LAB — POCT PREGNANCY, URINE: Preg Test, Ur: NEGATIVE

## 2015-05-27 NOTE — MAU Note (Signed)
Urine in lab 

## 2015-05-27 NOTE — MAU Note (Signed)
Period is late. 5 days ago had some pink spotting, then turned brown none since. Neg home test a couple nights ago.

## 2015-05-27 NOTE — MAU Provider Note (Signed)
History     CSN: [id number]  Arrival date and time: 05/27/15 1120   First Provider Initiated Contact with Patient 05/27/15 1329      Chief Complaint  Patient presents with  . Possible Pregnancy   HPI  Pt is late for menses 04/11/2015 with no menses since- pt had a neg home pregnancy test 5 to 6 days ago. Pt normally has RCM and does not understand why she has not had a period if she is not pregnant Pt has lost a lot of weight recently with no appetite- pt is not sure why. Pt has assigned PCP with Medicaid and has not followed up Pt is wanting to be pregnant   Period is late. 5 days ago had some pink spotting, then turned brown none since. Neg home test a couple nights ago.        Past Medical History  Diagnosis Date  . Bell's palsy   . Bronchitis     No past surgical history on file.  No family history on file.  History  Substance Use Topics  . Smoking status: Current Every Day Smoker -- 0.50 packs/day    Types: Cigarettes  . Smokeless tobacco: Not on file  . Alcohol Use: Yes     Comment: occasionally; last night    Allergies: No Known Allergies  Prescriptions prior to admission  Medication Sig Dispense Refill Last Dose  . azithromycin (ZITHROMAX) 250 MG tablet Take 4 tablets (1,000 mg total) by mouth once. 4 tablet 0   . chlorpheniramine-HYDROcodone (TUSSIONEX PENNKINETIC ER) 10-8 MG/5ML LQCR Take 5 mLs by mouth every 12 (twelve) hours as needed for cough (and pain). 100 mL 0   . HYDROcodone-acetaminophen (NORCO/VICODIN) 5-325 MG per tablet Take 1 tablet by mouth every 6 (six) hours as needed for moderate pain.   08/13/2014 at Unknown time  . ibuprofen (ADVIL,MOTRIN) 800 MG tablet Take 1 tablet (800 mg total) by mouth every 8 (eight) hours as needed for mild pain or moderate pain. 15 tablet 0   . metroNIDAZOLE (FLAGYL) 500 MG tablet Take 1 tablet (500 mg total) by mouth 2 (two) times daily. 14 tablet 0     Review of Systems  Constitutional: Positive for  weight loss. Negative for fever and chills.  Gastrointestinal: Negative for nausea, vomiting and abdominal pain.  Genitourinary: Negative for dysuria and urgency.   Physical Exam   Blood pressure 114/60, pulse 73, temperature 98.3 F (36.8 C), temperature source Oral, resp. rate 16, weight 107 lb (48.535 kg), last menstrual period 04/11/2015.  Physical Exam  Vitals reviewed. Constitutional: She is oriented to person, place, and time. She appears well-developed and well-nourished.  HENT:  Head: Normocephalic.  Eyes: Pupils are equal, round, and reactive to light.  Neck: Normal range of motion. Neck supple.  Cardiovascular: Normal rate.   Respiratory: Effort normal.  GI: Soft.  Musculoskeletal: Normal range of motion.  Neurological: She is alert and oriented to person, place, and time.  Skin: Skin is warm and dry.  Psychiatric: She has a normal mood and affect.    MAU Course  Procedures Results for orders placed or performed during the hospital encounter of 05/27/15 (from the past 24 hour(s))  Pregnancy, urine POC     Status: None   Collection Time: 05/27/15 11:55 AM  Result Value Ref Range   Preg Test, Ur NEGATIVE NEGATIVE  discussed neg pregnancy test and recommended evaluation with PCP Discussed possible other reasons for amenorrhea  Assessment and Plan  Negative pregnancy  test Follow up with PCP  Berk Pilot 05/27/2015, 1:31 PM

## 2015-06-29 ENCOUNTER — Encounter (HOSPITAL_COMMUNITY): Payer: Self-pay | Admitting: *Deleted

## 2015-06-29 ENCOUNTER — Inpatient Hospital Stay (HOSPITAL_COMMUNITY)
Admission: AD | Admit: 2015-06-29 | Discharge: 2015-06-29 | Disposition: A | Discharge status: Home / Self Care | Payer: Self-pay | Source: Ambulatory Visit | Attending: Family Medicine | Admitting: Family Medicine

## 2015-06-29 DIAGNOSIS — O99331 Smoking (tobacco) complicating pregnancy, first trimester: Secondary | ICD-10-CM | POA: Insufficient documentation

## 2015-06-29 DIAGNOSIS — Z3A11 11 weeks gestation of pregnancy: Secondary | ICD-10-CM | POA: Insufficient documentation

## 2015-06-29 DIAGNOSIS — R05 Cough: Secondary | ICD-10-CM | POA: Insufficient documentation

## 2015-06-29 DIAGNOSIS — R059 Cough, unspecified: Secondary | ICD-10-CM

## 2015-06-29 DIAGNOSIS — O9989 Other specified diseases and conditions complicating pregnancy, childbirth and the puerperium: Secondary | ICD-10-CM | POA: Insufficient documentation

## 2015-06-29 DIAGNOSIS — Z3201 Encounter for pregnancy test, result positive: Secondary | ICD-10-CM

## 2015-06-29 DIAGNOSIS — F17210 Nicotine dependence, cigarettes, uncomplicated: Secondary | ICD-10-CM | POA: Insufficient documentation

## 2015-06-29 HISTORY — DX: Urinary tract infection, site not specified: N39.0

## 2015-06-29 HISTORY — DX: Chlamydial infection, unspecified: A74.9

## 2015-06-29 HISTORY — DX: Gonococcal infection, unspecified: A54.9

## 2015-06-29 HISTORY — DX: Trichomonal vulvovaginitis: A59.01

## 2015-06-29 LAB — URINALYSIS, ROUTINE W REFLEX MICROSCOPIC
BILIRUBIN URINE: NEGATIVE
Glucose, UA: NEGATIVE mg/dL
Hgb urine dipstick: NEGATIVE
Ketones, ur: 15 mg/dL — AB
Leukocytes, UA: NEGATIVE
Nitrite: NEGATIVE
Protein, ur: NEGATIVE mg/dL
Specific Gravity, Urine: >1.03 — ABNORMAL HIGH (ref 1.005–1.030)
UROBILINOGEN UA: 0.2 mg/dL (ref 0.0–1.0)
pH: 5.5 (ref 5.0–8.0)

## 2015-06-29 LAB — POCT PREGNANCY, URINE: Preg Test, Ur: POSITIVE — AB

## 2015-06-29 MED ORDER — CONCEPT DHA 53.5-38-1 MG PO CAPS: 1.0000 | ORAL_CAPSULE | Freq: Every day | ORAL | Status: DC

## 2015-06-29 NOTE — MAU Provider Note (Signed)
History     CSN: [id number]  Arrival date and time: 06/29/15 1419   First Provider Initiated Contact with Patient 06/29/15 1634      Chief Complaint  Patient presents with  . Cough   HPI  [name] is a 19 y.o. G1P0 at [redacted]w[redacted]d here with report of cough for about 1-2 wks.  Also reports green mucous with fever. Denies fever, body aches or chills. Patient's last menstrual period was 04/11/2015.  Neg HPT 2 months ago. Also reports sharp intermittent pains in low back.  Pain occurs at 1630 every day.  No report of UTI symptoms, abdominal pain, abnormal vaginal discharge or vaginal bleeding.+ breast tenderness.    Past Medical History  Diagnosis Date  . Bell's palsy   . Bronchitis   . Trichomonas vaginitis   . Gonorrhea   . UTI (urinary tract infection)   . Chlamydia     Past Surgical History  Procedure Laterality Date  . Wisdom tooth extraction      History reviewed. No pertinent family history.  History  Substance Use Topics  . Smoking status: Current Every Day Smoker -- 0.25 packs/day    Types: Cigarettes  . Smokeless tobacco: Not on file  . Alcohol Use: Yes     Comment: occasionally    Allergies: No Known Allergies  Prescriptions prior to admission  Medication Sig Dispense Refill Last Dose  . chlorpheniramine-HYDROcodone (TUSSIONEX PENNKINETIC ER) 10-8 MG/5ML LQCR Take 5 mLs by mouth every 12 (twelve) hours as needed for cough (and pain). 100 mL 0   . ibuprofen (ADVIL,MOTRIN) 800 MG tablet Take 1 tablet (800 mg total) by mouth every 8 (eight) hours as needed for mild pain or moderate pain. 15 tablet 0     Review of Systems  Constitutional: Negative for fever and chills.  Respiratory: Positive for cough and sputum production. Negative for hemoptysis, shortness of breath and wheezing.   Gastrointestinal: Positive for nausea. Negative for vomiting, abdominal pain, diarrhea and constipation.  Genitourinary: Negative for dysuria, urgency and frequency.    Musculoskeletal: Negative for myalgias.  Neurological: Positive for headaches. Negative for dizziness.  All other systems reviewed and are negative.  Physical Exam   Blood pressure 134/75, pulse 75, temperature 98.7 F (37.1 C), temperature source Oral, resp. rate 18, height 5\' 5"  (1.651 m), weight 107 lb (48.535 kg), last menstrual period 04/11/2015, SpO2 100 %.  Physical Exam  Constitutional: She is oriented to person, place, and time. She appears well-developed and well-nourished. No distress.  HENT:  Head: Normocephalic.  Neck: Normal range of motion. Neck supple.  Cardiovascular: Normal rate, regular rhythm and normal heart sounds.   Respiratory: Effort normal and breath sounds normal. No respiratory distress. She has no wheezes. She has no rales.  GI: Soft. There is no tenderness.  Musculoskeletal: Normal range of motion. She exhibits no edema.       Arms: Neurological: She is alert and oriented to person, place, and time. She has normal reflexes.  Skin: Skin is warm and dry.    MAU Course  Procedures  Results for orders placed or performed during the hospital encounter of 06/29/15 (from the past 24 hour(s))  Urinalysis, Routine w reflex microscopic (not at [facility])     Status: Abnormal   Collection Time: 06/29/15  2:38 PM  Result Value Ref Range   Color, Urine YELLOW YELLOW   APPearance CLEAR CLEAR   Specific Gravity, Urine >1.030 (H) 1.005 - 1.030   pH 5.5 5.0 -  8.0   Glucose, UA NEGATIVE NEGATIVE mg/dL   Hgb urine dipstick NEGATIVE NEGATIVE   Bilirubin Urine NEGATIVE NEGATIVE   Ketones, ur 15 (A) NEGATIVE mg/dL   Protein, ur NEGATIVE NEGATIVE mg/dL   Urobilinogen, UA 0.2 0.0 - 1.0 mg/dL   Nitrite NEGATIVE NEGATIVE   Leukocytes, UA NEGATIVE NEGATIVE  Pregnancy, urine POC     Status: Abnormal   Collection Time: 06/29/15  3:08 PM  Result Value Ref Range   Preg Test, Ur POSITIVE (A) NEGATIVE     Assessment and Plan  19 y.o. G1P0 at [redacted]w[redacted]d wks   Cough  Plan: Discharge to home Prenatal Vitamins Pregnancy confirmation letter given Begin prenatal care OTC cough medicine  Rochele Pages N 06/29/2015, 4:58 PM

## 2015-06-29 NOTE — Discharge Instructions (Signed)
Safe Medications in Pregnancy   Acne: Benzoyl Peroxide Salicylic Acid  Backache/Headache: Tylenol: 2 regular strength every 4 hours OR              2 Extra strength every 6 hours  Colds/Coughs/Allergies: Benadryl (alcohol free) 25 mg every 6 hours as needed Breath right strips Claritin Cepacol throat lozenges Chloraseptic throat spray Cold-Eeze- up to three times per day Cough drops, alcohol free Flonase (by prescription only) Guaifenesin Mucinex Robitussin DM (plain only, alcohol free) Saline nasal spray/drops Sudafed (pseudoephedrine) & Actifed ** use only after [redacted] weeks gestation and if you do not have high blood pressure Tylenol Vicks Vaporub Zinc lozenges Zyrtec   Constipation: Colace Ducolax suppositories Fleet enema Glycerin suppositories Metamucil Milk of magnesia Miralax Senokot Smooth move tea  Diarrhea: Kaopectate Imodium A-D  *NO pepto Bismol  Hemorrhoids: Anusol Anusol HC Preparation H Tucks  Indigestion: Tums Maalox Mylanta Zantac  Pepcid  Insomnia: Benadryl (alcohol free)  every 6 hours as needed Tylenol PM Unisom, no Gelcaps  Leg Cramps: Tums MagGel  Nausea/Vomiting:  Bonine Dramamine Emetrol Ginger extract Sea bands Meclizine  Nausea medication to take during pregnancy:  Unisom (doxylamine succinate 25 mg tablets) Take one tablet daily at bedtime. If symptoms are not adequately controlled, the dose can be increased to a maximum recommended dose of two tablets daily (1/2 tablet in the morning, 1/2 tablet mid-afternoon and one at bedtime). Vitamin B6  tablets. Take one tablet twice a day (up to 200 mg per day).  Skin Rashes: Aveeno products Benadryl cream or  every 6 hours as needed Calamine Lotion 1% cortisone cream  Yeast infection: Gyne-lotrimin 7 Monistat 7   **If taking multiple medications, please check labels to avoid duplicating the same active ingredients **take medication as directed on  the label ** Do not exceed 4000 mg of tylenol in 24 hours **Do not take medications that contain aspirin or ibuprofen    Cough, Adult  A cough is a reflex that helps clear your throat and airways. It can help heal the body or may be a reaction to an irritated airway. A cough may only last 2 or 3 weeks (acute) or may last more than 8 weeks (chronic).  CAUSES Acute cough:  Viral or bacterial infections. Chronic cough:  Infections.  Allergies.  Asthma.  Post-nasal drip.  Smoking.  Heartburn or acid reflux.  Some medicines.  Chronic lung problems (COPD).  Cancer. SYMPTOMS   Cough.  Fever.  Chest pain.  Increased breathing rate.  High-pitched whistling sound when breathing (wheezing).  Colored mucus that you cough up (sputum). TREATMENT   A bacterial cough may be treated with antibiotic medicine.  A viral cough must run its course and will not respond to antibiotics.  Your caregiver may recommend other treatments if you have a chronic cough. HOME CARE INSTRUCTIONS   Only take over-the-counter or prescription medicines for pain, discomfort, or fever as directed by your caregiver. Use cough suppressants only as directed by your caregiver.  Use a cold steam vaporizer or humidifier in your bedroom or home to help loosen secretions.  Sleep in a semi-upright position if your cough is worse at night.  Rest as needed.  Stop smoking if you smoke. SEEK IMMEDIATE MEDICAL CARE IF:   You have pus in your sputum.  Your cough starts to worsen.  You cannot control your cough with suppressants and are losing sleep.  You begin coughing up blood.  You have difficulty breathing.  You develop pain which  is getting worse or is uncontrolled with medicine.  You have a fever. MAKE SURE YOU:   Understand these instructions.  Will watch your condition.  Will get help right away if you are not doing well or get worse. Document Released: 05/12/2011 Document Revised:  02/05/2012 Document Reviewed: 05/12/2011 [facility] Patient Information 2015 [place], [place]. This information is not intended to replace advice given to you by your health care provider. Make sure you discuss any questions you have with your health care provider.

## 2015-06-29 NOTE — MAU Note (Addendum)
Has had a cough for about 1-2 wks, green mucous.  No period since 5/15. Neg HPT 2 months ago.  Sharp pains in low back, off and on.  Breasts be tender

## 2015-07-10 ENCOUNTER — Inpatient Hospital Stay (HOSPITAL_COMMUNITY)
Admission: AD | Admit: 2015-07-10 | Discharge: 2015-07-10 | Disposition: A | Discharge status: Home / Self Care | Payer: Self-pay | Source: Ambulatory Visit | Attending: Obstetrics & Gynecology | Admitting: Obstetrics & Gynecology

## 2015-07-10 ENCOUNTER — Encounter (HOSPITAL_COMMUNITY): Payer: Self-pay

## 2015-07-10 ENCOUNTER — Inpatient Hospital Stay (HOSPITAL_COMMUNITY): Payer: Self-pay

## 2015-07-10 DIAGNOSIS — O209 Hemorrhage in early pregnancy, unspecified: Secondary | ICD-10-CM | POA: Insufficient documentation

## 2015-07-10 DIAGNOSIS — O4691 Antepartum hemorrhage, unspecified, first trimester: Secondary | ICD-10-CM

## 2015-07-10 DIAGNOSIS — N760 Acute vaginitis: Secondary | ICD-10-CM | POA: Insufficient documentation

## 2015-07-10 DIAGNOSIS — Z3A01 Less than 8 weeks gestation of pregnancy: Secondary | ICD-10-CM

## 2015-07-10 DIAGNOSIS — B9689 Other specified bacterial agents as the cause of diseases classified elsewhere: Secondary | ICD-10-CM | POA: Insufficient documentation

## 2015-07-10 DIAGNOSIS — O23591 Infection of other part of genital tract in pregnancy, first trimester: Secondary | ICD-10-CM | POA: Insufficient documentation

## 2015-07-10 DIAGNOSIS — Z3A12 12 weeks gestation of pregnancy: Secondary | ICD-10-CM | POA: Insufficient documentation

## 2015-07-10 LAB — CBC WITH DIFFERENTIAL/PLATELET
Basophils Absolute: 0 10*3/uL (ref 0.0–0.1)
Basophils Relative: 0 % (ref 0–1)
Eosinophils Absolute: 0.1 10*3/uL (ref 0.0–0.7)
Eosinophils Relative: 2 % (ref 0–5)
HCT: 37.9 % (ref 36.0–46.0)
HEMOGLOBIN: 13 g/dL (ref 12.0–15.0)
Lymphocytes Relative: 34 % (ref 12–46)
Lymphs Abs: 1.9 10*3/uL (ref 0.7–4.0)
MCH: 30.4 pg (ref 26.0–34.0)
MCHC: 34.3 g/dL (ref 30.0–36.0)
MCV: 88.8 fL (ref 78.0–100.0)
MONO ABS: 0.6 10*3/uL (ref 0.1–1.0)
MONOS PCT: 10 % (ref 3–12)
Neutro Abs: 3 10*3/uL (ref 1.7–7.7)
Neutrophils Relative %: 54 % (ref 43–77)
Platelets: 228 10*3/uL (ref 150–400)
RBC: 4.27 MIL/uL (ref 3.87–5.11)
RDW: 12.7 % (ref 11.5–15.5)
WBC: 5.6 10*3/uL (ref 4.0–10.5)

## 2015-07-10 LAB — URINALYSIS, ROUTINE W REFLEX MICROSCOPIC
Bilirubin Urine: NEGATIVE
Glucose, UA: NEGATIVE mg/dL
Ketones, ur: NEGATIVE mg/dL
Leukocytes, UA: NEGATIVE
Nitrite: NEGATIVE
PH: 5.5 (ref 5.0–8.0)
Protein, ur: NEGATIVE mg/dL
Specific Gravity, Urine: >1.03 — ABNORMAL HIGH (ref 1.005–1.030)
Urobilinogen, UA: 0.2 mg/dL (ref 0.0–1.0)

## 2015-07-10 LAB — URINE MICROSCOPIC-ADD ON

## 2015-07-10 LAB — WET PREP, GENITAL
Trich, Wet Prep: NONE SEEN
Yeast Wet Prep HPF POC: NONE SEEN

## 2015-07-10 LAB — ABO/RH: ABO/RH(D): A POS

## 2015-07-10 MED ORDER — METRONIDAZOLE 500 MG PO TABS: 500.0000 mg | ORAL_TABLET | Freq: Two times a day (BID) | ORAL | Status: DC

## 2015-07-10 NOTE — Discharge Instructions (Signed)
Bacterial Vaginosis Bacterial vaginosis is a vaginal infection that occurs when the normal balance of bacteria in the vagina is disrupted. It results from an overgrowth of certain bacteria. This is the most common vaginal infection in women of childbearing age. Treatment is important to prevent complications, especially in pregnant women, as it can cause a premature delivery. CAUSES  Bacterial vaginosis is caused by an increase in harmful bacteria that are normally present in smaller amounts in the vagina. Several different kinds of bacteria can cause bacterial vaginosis. However, the reason that the condition develops is not fully understood. RISK FACTORS Certain activities or behaviors can put you at an increased risk of developing bacterial vaginosis, including:  Having a new sex partner or multiple sex partners.  Douching.  Using an intrauterine device (IUD) for contraception. Women do not get bacterial vaginosis from toilet seats, bedding, swimming pools, or contact with objects around them. SIGNS AND SYMPTOMS  Some women with bacterial vaginosis have no signs or symptoms. Common symptoms include:  Grey vaginal discharge.  A fishlike odor with discharge, especially after sexual intercourse.  Itching or burning of the vagina and vulva.  Burning or pain with urination. DIAGNOSIS  Your health care provider will take a medical history and examine the vagina for signs of bacterial vaginosis. A sample of vaginal fluid may be taken. Your health care provider will look at this sample under a microscope to check for bacteria and abnormal cells. A vaginal pH test may also be done.  TREATMENT  Bacterial vaginosis may be treated with antibiotic medicines. These may be given in the form of a pill or a vaginal cream. A second round of antibiotics may be prescribed if the condition comes back after treatment.  HOME CARE INSTRUCTIONS   Only take over-the-counter or prescription medicines as  directed by your health care provider.  If antibiotic medicine was prescribed, take it as directed. Make sure you finish it even if you start to feel better.  Do not have sex until treatment is completed.  Tell all sexual partners that you have a vaginal infection. They should see their health care provider and be treated if they have problems, such as a mild rash or itching.  Practice safe sex by using condoms and only having one sex partner. SEEK MEDICAL CARE IF:   Your symptoms are not improving after 3 days of treatment.  You have increased discharge or pain.  You have a fever. MAKE SURE YOU:   Understand these instructions.  Will watch your condition.  Will get help right away if you are not doing well or get worse. FOR MORE INFORMATION  Centers for Disease Control and Prevention, Division of STD Prevention: www.cdc.gov/std American Sexual Health Association (ASHA): www.ashastd.org  Document Released: 11/13/2005 Document Revised: 09/03/2013 Document Reviewed: 06/25/2013 [employer] Patient Information 2015 [employer]. This information is not intended to replace advice given to you by your health care provider. Make sure you discuss any questions you have with your health care provider.  

## 2015-07-10 NOTE — MAU Provider Note (Signed)
History     CSN: [id number]  Arrival date and time: 07/10/15 1037   First Provider Initiated Contact with Patient 07/10/15 1156      Chief Complaint  Patient presents with  . Vaginal Bleeding   HPI Linda Khan 19 y.o. G1P0  presents to MAU complaining of vaginal bleeding.  She noted reddish pink blood on toilet tissue this morning.  She has seen it twice today and has not been on underwear.  She has not had abdominal pain. She states her whole body feels, "not the same." She has not started [facility].  She has not taken PNV previously prescribed.  She has trouble using bathroom - constipated - but had BM earlier today.  She had sex last night.   OB History    Gravida Para Term Preterm AB TAB SAB Ectopic Multiple Living   1               Past Medical History  Diagnosis Date  . Bell's palsy   . Bronchitis   . Trichomonas vaginitis   . Gonorrhea   . UTI (urinary tract infection)   . Chlamydia     Past Surgical History  Procedure Laterality Date  . Wisdom tooth extraction      History reviewed. No pertinent family history.  Social History  Substance Use Topics  . Smoking status: Current Every Day Smoker -- 0.25 packs/day    Types: Cigarettes  . Smokeless tobacco: Never Used  . Alcohol Use: Yes     Comment: occasionally    Allergies: No Known Allergies  Prescriptions prior to admission  Medication Sig Dispense Refill Last Dose  . acetaminophen (TYLENOL) 500 MG tablet Take 500 mg by mouth every 6 (six) hours as needed.   Past Week at Unknown time  . Prenat-FeFum-FePo-FA-Omega 3 (CONCEPT DHA) 53.5-38-1 MG CAPS Take 1 tablet by mouth daily. (Patient not taking: Reported on 07/10/2015) 30 capsule 2     ROS Pertinent ROS in HPI.  All other systems are negative.   Physical Exam   Blood pressure 112/62, pulse 67, temperature 98.1 F (36.7 C), temperature source Oral, resp. rate 16, height  (1.575 m), weight 109 lb (49.442 kg), last menstrual period  04/11/2015.  Physical Exam  Constitutional: She is oriented to person, place, and time. She appears well-developed and well-nourished. No distress.  HENT:  Head: Normocephalic and atraumatic.  Eyes: EOM are normal.  Neck: Normal range of motion.  Cardiovascular: Normal rate.   Respiratory: Breath sounds normal. No respiratory distress.  GI: Soft. She exhibits no distension. There is no tenderness.  Genitourinary:  No blood noted on exam No cmt.  No adnexal mass or tenderness  Musculoskeletal: Normal range of motion.  Neurological: She is alert and oriented to person, place, and time.  Skin: Skin is warm and dry.  Psychiatric: She has a normal mood and affect.   Recent Results (from the past 2160 hour(s))  Pregnancy, urine POC     Status: None   Collection Time: 05/27/15 11:55 AM  Result Value Ref Range   Preg Test, Ur NEGATIVE NEGATIVE    Comment:        THE SENSITIVITY OF THIS METHODOLOGY IS >24 mIU/mL   Urinalysis, Routine w reflex microscopic (not at [facility])     Status: Abnormal   Collection Time: 06/29/15  2:38 PM  Result Value Ref Range   Color, Urine YELLOW YELLOW   APPearance CLEAR CLEAR   Specific Gravity, Urine >1.030 (H)  1.005 - 1.030   pH 5.5 5.0 - 8.0   Glucose, UA NEGATIVE NEGATIVE mg/dL   Hgb urine dipstick NEGATIVE NEGATIVE   Bilirubin Urine NEGATIVE NEGATIVE   Ketones, ur 15 (A) NEGATIVE mg/dL   Protein, ur NEGATIVE NEGATIVE mg/dL   Urobilinogen, UA 0.2 0.0 - 1.0 mg/dL   Nitrite NEGATIVE NEGATIVE   Leukocytes, UA NEGATIVE NEGATIVE    Comment: MICROSCOPIC NOT DONE ON URINES WITH NEGATIVE PROTEIN, BLOOD, LEUKOCYTES, NITRITE, OR GLUCOSE <1000 mg/dL.  Pregnancy, urine POC     Status: Abnormal   Collection Time: 06/29/15  3:08 PM  Result Value Ref Range   Preg Test, Ur POSITIVE (A) NEGATIVE    Comment:        THE SENSITIVITY OF THIS METHODOLOGY IS >24 mIU/mL   GC/Chlamydia probe amp ([place])     Status: None   Collection Time: 07/10/15 12:00 AM   Result Value Ref Range   Chlamydia Negative     Comment: Normal Reference Range - Negative   Neisseria gonorrhea Negative     Comment: Normal Reference Range - Negative  Urinalysis, Routine w reflex microscopic (not at [facility])     Status: Abnormal   Collection Time: 07/10/15 11:14 AM  Result Value Ref Range   Color, Urine YELLOW YELLOW   APPearance CLEAR CLEAR   Specific Gravity, Urine >1.030 (H) 1.005 - 1.030   pH 5.5 5.0 - 8.0   Glucose, UA NEGATIVE NEGATIVE mg/dL   Hgb urine dipstick MODERATE (A) NEGATIVE   Bilirubin Urine NEGATIVE NEGATIVE   Ketones, ur NEGATIVE NEGATIVE mg/dL   Protein, ur NEGATIVE NEGATIVE mg/dL   Urobilinogen, UA 0.2 0.0 - 1.0 mg/dL   Nitrite NEGATIVE NEGATIVE   Leukocytes, UA NEGATIVE NEGATIVE  Urine microscopic-add on     Status: Abnormal   Collection Time: 07/10/15 11:14 AM  Result Value Ref Range   Squamous Epithelial / LPF FEW (A) RARE   WBC, UA 0-2 <3 WBC/hpf   RBC / HPF 3-6 <3 RBC/hpf   Urine-Other MUCOUS PRESENT   CBC with Differential/Platelet     Status: None   Collection Time: 07/10/15 12:45 PM  Result Value Ref Range   WBC 5.6 4.0 - 10.5 K/uL   RBC 4.27 3.87 - 5.11 MIL/uL   Hemoglobin 13.0 12.0 - 15.0 g/dL   HCT 16.1 09.6 - 04.5 %   MCV 88.8 78.0 - 100.0 fL   MCH 30.4 26.0 - 34.0 pg   MCHC 34.3 30.0 - 36.0 g/dL   RDW 40.9 81.1 - 91.4 %   Platelets 228 150 - 400 K/uL   Neutrophils Relative % 54 43 - 77 %   Neutro Abs 3.0 1.7 - 7.7 K/uL   Lymphocytes Relative 34 12 - 46 %   Lymphs Abs 1.9 0.7 - 4.0 K/uL   Monocytes Relative 10 3 - 12 %   Monocytes Absolute 0.6 0.1 - 1.0 K/uL   Eosinophils Relative 2 0 - 5 %   Eosinophils Absolute 0.1 0.0 - 0.7 K/uL   Basophils Relative 0 0 - 1 %   Basophils Absolute 0.0 0.0 - 0.1 K/uL  ABO/Rh     Status: None   Collection Time: 07/10/15 12:45 PM  Result Value Ref Range   ABO/RH(D) A POS   HIV antibody     Status: None   Collection Time: 07/10/15 12:45 PM  Result Value Ref Range   HIV Screen 4th  Generation wRfx Non Reactive Non Reactive    Comment: (NOTE)  Performed At: [facility] [address] [place], [place] [id number] Mila Homer MD WU:[id number]   RPR     Status: None   Collection Time: 07/10/15 12:45 PM  Result Value Ref Range   RPR Ser Ql Non Reactive Non Reactive    Comment: (NOTE) Performed At: [facility] [address] [place], [place] [id number] Mila Homer MD VH:[id number]   Wet prep, genital     Status: Abnormal   Collection Time: 07/10/15  1:32 PM  Result Value Ref Range   Yeast Wet Prep HPF POC NONE SEEN NONE SEEN   Trich, Wet Prep NONE SEEN NONE SEEN   Clue Cells Wet Prep HPF POC MODERATE (A) NONE SEEN   WBC, Wet Prep HPF POC FEW (A) NONE SEEN    Comment: FEW BACTERIA SEEN    US Ob Comp Less 14 Wks  07/10/2015   CLINICAL DATA:  First trimester of pregnancy, vaginal spotting.  EXAM: OBSTETRIC <14 WK [place] AND TRANSVAGINAL OB US  TECHNIQUE: Both transabdominal and transvaginal ultrasound examinations were performed for complete evaluation of the gestation as well as the maternal uterus, adnexal regions, and pelvic cul-de-sac. Transvaginal technique was performed to assess early pregnancy.  COMPARISON:  None.  FINDINGS: Intrauterine gestational sac: Visualized/normal in shape.  Yolk sac:  Visualized.  Embryo:  Visualized.  Cardiac Activity: Visualized.  Heart Rate: 108  bpm  CRL:  2.8  mm   6 w   0 d                  [place] EDC: March 04, 2016.  Maternal uterus/adnexae: Ovaries appear normal. No free fluid is noted.  IMPRESSION: Single live intrauterine gestation of 6 weeks 0 days.   Electronically Signed   By: Lupita Raider, M.D.   On: 07/10/2015 13:30   US Ob Transvaginal  07/10/2015   CLINICAL DATA:  First trimester of pregnancy, vaginal spotting.  EXAM: OBSTETRIC <14 WK [place] AND TRANSVAGINAL OB US  TECHNIQUE: Both transabdominal and transvaginal ultrasound examinations were performed for complete evaluation of the gestation as well as the  maternal uterus, adnexal regions, and pelvic cul-de-sac. Transvaginal technique was performed to assess early pregnancy.  COMPARISON:  None.  FINDINGS: Intrauterine gestational sac: Visualized/normal in shape.  Yolk sac:  Visualized.  Embryo:  Visualized.  Cardiac Activity: Visualized.  Heart Rate: 108  bpm  CRL:  2.8  mm   6 w   0 d                  [place] EDC: March 04, 2016.  Maternal uterus/adnexae: Ovaries appear normal. No free fluid is noted.  IMPRESSION: Single live intrauterine gestation of 6 weeks 0 days.   Electronically Signed   By: Lupita Raider, M.D.   On: 07/10/2015 13:30   MAU Course  Procedures  MDM Ectopic workup ordered to eval based on vaginal bleeding in a pregnant pt. Unable to hear heart tones on exam. Per u/s - pt not as far along as expected.   No evidence of emergent condition BV on wet prep  Assessment and Plan  A:  1. Bacterial vaginosis   2. Vaginal bleeding in pregnancy, first trimester    P: Discharge to home Pelvic rest Obtain [facility] asap Flagyl bid x 1 week No etoh/IC x 10 days pnv qd Patient may return to MAU as needed or if her condition were to change or worsen   Bertram Denver 07/10/2015, 11:57  AM  

## 2015-07-10 NOTE — MAU Note (Signed)
Pt states here for bleeding noted when wiping. Last intercourse last pm. Feels mild cramping.

## 2015-07-11 LAB — RPR: RPR: NONREACTIVE

## 2015-07-11 LAB — HIV ANTIBODY (ROUTINE TESTING W REFLEX): HIV Screen 4th Generation wRfx: NONREACTIVE

## 2015-07-12 LAB — GC/CHLAMYDIA PROBE AMP (~~LOC~~) NOT AT ARMC
Chlamydia: NEGATIVE
Neisseria Gonorrhea: NEGATIVE

## 2015-07-29 ENCOUNTER — Other Ambulatory Visit (HOSPITAL_COMMUNITY): Payer: Self-pay | Admitting: Nurse Practitioner

## 2015-07-29 DIAGNOSIS — Z3A12 12 weeks gestation of pregnancy: Secondary | ICD-10-CM

## 2015-07-29 DIAGNOSIS — Z3682 Encounter for antenatal screening for nuchal translucency: Secondary | ICD-10-CM

## 2015-07-29 LAB — OB RESULTS CONSOLE VARICELLA ZOSTER ANTIBODY, IGG
Varicella: IMMUNE
Varicella: IMMUNE

## 2015-07-29 LAB — SICKLE CELL SCREEN

## 2015-07-29 LAB — OB RESULTS CONSOLE GC/CHLAMYDIA: GC PROBE AMP, GENITAL: NEGATIVE

## 2015-07-29 LAB — CYTOLOGY - PAP
PAP SMEAR: NEGATIVE
PAP SMEAR: NEGATIVE

## 2015-07-29 LAB — OB RESULTS CONSOLE ANTIBODY SCREEN: Antibody Screen: NEGATIVE

## 2015-07-29 LAB — OB RESULTS CONSOLE HEPATITIS B SURFACE ANTIGEN: Hepatitis B Surface Ag: NEGATIVE

## 2015-07-29 LAB — OB RESULTS CONSOLE RUBELLA ANTIBODY, IGM: Rubella: IMMUNE

## 2015-08-27 ENCOUNTER — Ambulatory Visit (HOSPITAL_COMMUNITY)
Admission: RE | Admit: 2015-08-27 | Discharge: 2015-08-27 | Disposition: A | Discharge status: Home / Self Care | Payer: Medicaid Other | Source: Ambulatory Visit | Attending: Nurse Practitioner | Admitting: Nurse Practitioner

## 2015-08-27 ENCOUNTER — Other Ambulatory Visit (HOSPITAL_COMMUNITY): Payer: Self-pay | Admitting: Nurse Practitioner

## 2015-08-27 DIAGNOSIS — O99331 Smoking (tobacco) complicating pregnancy, first trimester: Secondary | ICD-10-CM

## 2015-08-27 DIAGNOSIS — O99321 Drug use complicating pregnancy, first trimester: Secondary | ICD-10-CM | POA: Diagnosis not present

## 2015-08-27 DIAGNOSIS — Z3A12 12 weeks gestation of pregnancy: Secondary | ICD-10-CM | POA: Diagnosis not present

## 2015-08-27 DIAGNOSIS — Z36 Encounter for antenatal screening of mother: Secondary | ICD-10-CM | POA: Insufficient documentation

## 2015-08-27 DIAGNOSIS — Z369 Encounter for antenatal screening, unspecified: Secondary | ICD-10-CM

## 2015-08-27 DIAGNOSIS — Z3682 Encounter for antenatal screening for nuchal translucency: Secondary | ICD-10-CM

## 2015-09-09 ENCOUNTER — Other Ambulatory Visit (HOSPITAL_COMMUNITY): Payer: Self-pay | Admitting: Nurse Practitioner

## 2015-10-02 ENCOUNTER — Inpatient Hospital Stay (HOSPITAL_COMMUNITY)
Admission: AD | Admit: 2015-10-02 | Discharge: 2015-10-02 | Disposition: A | Discharge status: Home / Self Care | Payer: Medicaid Other | Source: Ambulatory Visit | Attending: Obstetrics and Gynecology | Admitting: Obstetrics and Gynecology

## 2015-10-02 ENCOUNTER — Encounter (HOSPITAL_COMMUNITY): Payer: Self-pay | Admitting: *Deleted

## 2015-10-02 DIAGNOSIS — R0981 Nasal congestion: Secondary | ICD-10-CM | POA: Diagnosis present

## 2015-10-02 DIAGNOSIS — Z3A18 18 weeks gestation of pregnancy: Secondary | ICD-10-CM | POA: Insufficient documentation

## 2015-10-02 DIAGNOSIS — O99332 Smoking (tobacco) complicating pregnancy, second trimester: Secondary | ICD-10-CM | POA: Insufficient documentation

## 2015-10-02 DIAGNOSIS — J020 Streptococcal pharyngitis: Secondary | ICD-10-CM | POA: Insufficient documentation

## 2015-10-02 DIAGNOSIS — F17210 Nicotine dependence, cigarettes, uncomplicated: Secondary | ICD-10-CM | POA: Diagnosis not present

## 2015-10-02 DIAGNOSIS — J069 Acute upper respiratory infection, unspecified: Secondary | ICD-10-CM | POA: Insufficient documentation

## 2015-10-02 DIAGNOSIS — O99512 Diseases of the respiratory system complicating pregnancy, second trimester: Secondary | ICD-10-CM | POA: Insufficient documentation

## 2015-10-02 DIAGNOSIS — J029 Acute pharyngitis, unspecified: Secondary | ICD-10-CM

## 2015-10-02 LAB — URINALYSIS, ROUTINE W REFLEX MICROSCOPIC
Bilirubin Urine: NEGATIVE
GLUCOSE, UA: NEGATIVE mg/dL
Ketones, ur: 15 mg/dL — AB
Nitrite: NEGATIVE
PROTEIN: NEGATIVE mg/dL
Specific Gravity, Urine: 1.025 (ref 1.005–1.030)
UROBILINOGEN UA: 0.2 mg/dL (ref 0.0–1.0)
pH: 6 (ref 5.0–8.0)

## 2015-10-02 LAB — RAPID STREP SCREEN (MED CTR MEBANE ONLY): STREPTOCOCCUS, GROUP A SCREEN (DIRECT): NEGATIVE

## 2015-10-02 LAB — URINE MICROSCOPIC-ADD ON

## 2015-10-02 MED ORDER — AMOXICILLIN 500 MG PO TABS: 500.0000 mg | ORAL_TABLET | Freq: Two times a day (BID) | ORAL | Status: DC

## 2015-10-02 NOTE — MAU Provider Note (Signed)
History     CSN: [id number]  Arrival date and time: 10/02/15 1203   First Provider Initiated Contact with Patient 10/02/15 1223      Chief Complaint  Patient presents with  . Abdominal Pain  . Cough  . Nasal Congestion   HPI Ms. Donah Driveriara Q Chismar is a 19 y.o. G1P0 at 237w0d who presents to MAU today with complaint of URI symptoms. The patient states that for the past week she has had cough, mild sore throat, nasal congestion and sinus pressure. She states that she has noted something drainage from the back of her throat on the right side. She denies fever. She is concerned about possible pink eye because her friends child has it and she had itching in her eye this morning. She states occasional mild abdominal pain. She states no pain now, but does have pain sometimes at night when laying on her side. She denies vaginal bleeding, discharge, LOF or UTI symptoms. [facility]   OB History    Gravida Para Term Preterm AB TAB SAB Ectopic Multiple Living   1               Past Medical History  Diagnosis Date  . Bell's palsy   . Bronchitis   . Trichomonas vaginitis   . Gonorrhea   . UTI (urinary tract infection)   . Chlamydia     Past Surgical History  Procedure Laterality Date  . Wisdom tooth extraction      History reviewed. No pertinent family history.  Social History  Substance Use Topics  . Smoking status: Current Every Day Smoker -- 0.25 packs/day    Types: Cigarettes  . Smokeless tobacco: Never Used  . Alcohol Use: Yes     Comment: occasionally    Allergies: No Known Allergies  Prescriptions prior to admission  Medication Sig Dispense Refill Last Dose  . metroNIDAZOLE (FLAGYL) 500 MG tablet Take 1 tablet (500 mg total) by mouth 2 (two) times daily. (Patient not taking: Reported on 10/02/2015) 14 tablet 0   . Prenat-FeFum-FePo-FA-Omega 3 (CONCEPT DHA) 53.5-38-1 MG CAPS Take 1 tablet by mouth daily. (Patient not taking: Reported on  07/10/2015) 30 capsule 2     Review of Systems  Constitutional: Negative for fever and malaise/fatigue.  HENT: Positive for congestion and sore throat. Negative for ear discharge and ear pain.   Respiratory: Positive for cough. Negative for sputum production.   Gastrointestinal: Positive for abdominal pain. Negative for nausea, vomiting, diarrhea and constipation.  Genitourinary: Negative for dysuria, urgency and frequency.       Neg - vaginal bleeding, discharge   Physical Exam   Blood pressure 111/62, pulse 84, temperature 98.4 F (36.9 C), resp. rate 16, height 5\' 3"  (1.6 m), weight 115 lb (52.164 kg), last menstrual period 04/11/2015.  Physical Exam  Nursing note and vitals reviewed. Constitutional: She is oriented to person, place, and time. She appears well-developed and well-nourished. No distress.  HENT:  Head: Normocephalic and atraumatic.  Right Ear: Tympanic membrane, external ear and ear canal normal.  Left Ear: Tympanic membrane, external ear and ear canal normal.  Nose: Mucosal edema and rhinorrhea present. Right sinus exhibits no maxillary sinus tenderness and no frontal sinus tenderness. Left sinus exhibits no maxillary sinus tenderness and no frontal sinus tenderness.  Mouth/Throat: Mucous membranes are normal. Normal dentition. No dental abscesses, uvula swelling or dental caries. Posterior oropharyngeal edema and posterior oropharyngeal erythema present. No oropharyngeal exudate or tonsillar  abscesses.  Eyes: EOM are normal. Right eye exhibits no discharge and no exudate. Left eye exhibits no discharge and no exudate. Right conjunctiva is not injected. Left conjunctiva is not injected. No scleral icterus.  Cardiovascular: Normal rate.   Respiratory: Effort normal.  GI: Soft. She exhibits no distension.  Neurological: She is alert and oriented to person, place, and time.  Skin: Skin is warm and dry. No erythema.  Psychiatric: She has a normal mood and affect.     Results for orders placed or performed during the hospital encounter of 10/02/15 (from the past 24 hour(s))  Urinalysis, Routine w reflex microscopic (not at [facility])     Status: Abnormal   Collection Time: 10/02/15 12:15 PM  Result Value Ref Range   Color, Urine YELLOW YELLOW   APPearance CLEAR CLEAR   Specific Gravity, Urine 1.025 1.005 - 1.030   pH 6.0 5.0 - 8.0   Glucose, UA NEGATIVE NEGATIVE mg/dL   Hgb urine dipstick TRACE (A) NEGATIVE   Bilirubin Urine NEGATIVE NEGATIVE   Ketones, ur 15 (A) NEGATIVE mg/dL   Protein, ur NEGATIVE NEGATIVE mg/dL   Urobilinogen, UA 0.2 0.0 - 1.0 mg/dL   Nitrite NEGATIVE NEGATIVE   Leukocytes, UA SMALL (A) NEGATIVE  Urine microscopic-add on     Status: Abnormal   Collection Time: 10/02/15 12:15 PM  Result Value Ref Range   Squamous Epithelial / LPF FEW (A) RARE   WBC, UA 7-10 <3 WBC/hpf   RBC / HPF 0-2 <3 RBC/hpf   Bacteria, UA MANY (A) RARE    MAU Course  Procedures None  MDM FHR - 154 bpm with doppler UA and rapid strep today Urine culture pending Rapid strep test pending at time of discharge. Due to length and severity of sign/symptoms will treat with Amoxicillin today for presumed strep throat pharyngitis Peritonsillar abscess is of low suspicion given lack of significant swelling, change in speech, difficulty swallowing, fever or significant exudate/drainage. Patient counseled on warning signs/symptoms and advised to present to [facility] if symptoms arise.  Assessment and Plan  A: [facility] at [redacted]w[redacted]d URI vs strep throat  P: Discharge home Rx for Amoxicillin given to patient  Warning signs for worsening condition discussed Rapid strep swab and urine culture pending at time of discharge Patient advised to follow-up with [facility] as scheduled for routine prenatal care or sooner if symptoms worsen Patient advised that if swelling of throat and/or drainage worsens she should present to [facility] or [facility] for further evaluation and management Patient  may return to MAU as needed or if her condition were to change or worsen   Marny Lowenstein, PA-C  10/02/2015, 1:22 PM

## 2015-10-02 NOTE — Discharge Instructions (Signed)
Pharyngitis Pharyngitis is a sore throat (pharynx). There is redness, pain, and swelling of your throat. HOME CARE   Drink enough fluids to keep your pee (urine) clear or pale yellow.  Only take medicine as told by your doctor.  You may get sick again if you do not take medicine as told. Finish your medicines, even if you start to feel better.  Do not take aspirin.  Rest.  Rinse your mouth (gargle) with salt water ( tsp of salt per 1 qt of water) every 1-2 hours. This will help the pain.  If you are not at risk for choking, you can suck on hard candy or sore throat lozenges. GET HELP IF:  You have large, tender lumps on your neck.  You have a rash.  You cough up green, yellow-brown, or bloody spit. GET HELP RIGHT AWAY IF:   You have a stiff neck.  You drool or cannot swallow liquids.  You throw up (vomit) or are not able to keep medicine or liquids down.  You have very bad pain that does not go away with medicine.  You have problems breathing (not from a stuffy nose). MAKE SURE YOU:   Understand these instructions.  Will watch your condition.  Will get help right away if you are not doing well or get worse.   This information is not intended to replace advice given to you by your health care provider. Make sure you discuss any questions you have with your health care provider.   Document Released: 05/01/2008 Document Revised: 09/03/2013 Document Reviewed: 07/21/2013 Elsevier Interactive Patient Education 2016 Else[employer] Inc[employer] Tonsillitis Tonsillitis is an infection of the throat. This infection causes the tonsils to become red, tender, and puffy (swollen). Tonsils are groups of tissue at the back of your throat. If bacteria caused your infection, antibiotic medicine will be given to you. Sometimes symptoms of tonsillitis can be relieved with the use of steroid medicine. If your tonsillitis is severe and happens often, you may need to get your tonsils removed  (tonsillectomy). HOME CARE   Rest and sleep often.  Drink enough fluids to keep your pee (urine) clear or pale yellow.  While your throat is sore, eat soft or liquid foods like:  Soup.  Ice cream.  Instant breakfast drinks.  Eat frozen ice pops.  Gargle with a warm or cold liquid to help soothe the throat. Gargle with a water and salt mix. Mix 1/4 teaspoon of salt and 1/4 teaspoon of baking soda in 1 cup of water.  Only take medicines as told by your doctor.  If you are given medicines (antibiotics), take them as told. Finish them even if you start to feel better. GET HELP IF:  You have large, tender lumps in your neck.  You have a rash.  You cough up green, yellow-brown, or bloody fluid.  You cannot swallow liquids or food for 24 hours.  You notice that only one of your tonsils is swollen. GET HELP RIGHT AWAY IF:   You throw up (vomit).  You have a very bad headache.  You have a stiff neck.  You have chest pain.  You have trouble breathing or swallowing.  You have bad throat pain, drooling, or your voice changes.  You have bad pain not helped by medicine.  You cannot fully open your mouth.  You have redness, puffiness, or bad pain in the neck.  You have a fever. MAKE SURE YOU:   Understand these instructions.  Will watch your  condition. °· Will get help right away if you are not doing well or get worse. °  °This information is not intended to replace advice given to you by your health care provider. Make sure you discuss any questions you have with your health care provider. °  °Document Released: 05/01/2008 Document Revised: 11/18/2013 Document Reviewed: 05/02/2013 °Elsevier Interactive Patient Education ©2016 Elsevier Inc. ° °

## 2015-10-02 NOTE — MAU Note (Addendum)
Pt presents to MAU with complaints of lower abdominal pain that started a week ago.Runny nose.cough and pink eye. States she kept a baby that had pink eye yesterday.

## 2015-10-04 LAB — CULTURE, GROUP A STREP: STREP A CULTURE: NEGATIVE

## 2015-10-04 LAB — CULTURE, OB URINE: Culture: >=100000

## 2015-10-12 ENCOUNTER — Other Ambulatory Visit (HOSPITAL_COMMUNITY): Payer: Self-pay | Admitting: Nurse Practitioner

## 2015-10-12 ENCOUNTER — Ambulatory Visit (HOSPITAL_COMMUNITY)
Admission: RE | Admit: 2015-10-12 | Discharge: 2015-10-12 | Disposition: A | Discharge status: Home / Self Care | Payer: Medicaid Other | Source: Ambulatory Visit | Attending: Nurse Practitioner | Admitting: Nurse Practitioner

## 2015-10-12 DIAGNOSIS — Z0489 Encounter for examination and observation for other specified reasons: Secondary | ICD-10-CM

## 2015-10-12 DIAGNOSIS — Z3A19 19 weeks gestation of pregnancy: Secondary | ICD-10-CM

## 2015-10-12 DIAGNOSIS — IMO0002 Reserved for concepts with insufficient information to code with codable children: Secondary | ICD-10-CM

## 2015-10-12 DIAGNOSIS — Z36 Encounter for antenatal screening of mother: Secondary | ICD-10-CM | POA: Insufficient documentation

## 2015-10-12 DIAGNOSIS — O99332 Smoking (tobacco) complicating pregnancy, second trimester: Secondary | ICD-10-CM | POA: Diagnosis not present

## 2015-10-12 DIAGNOSIS — Z1389 Encounter for screening for other disorder: Secondary | ICD-10-CM

## 2015-10-30 ENCOUNTER — Encounter (HOSPITAL_COMMUNITY): Payer: Self-pay | Admitting: *Deleted

## 2015-10-30 ENCOUNTER — Inpatient Hospital Stay (HOSPITAL_COMMUNITY)
Admission: AD | Admit: 2015-10-30 | Discharge: 2015-10-30 | Disposition: A | Discharge status: Home / Self Care | Payer: Medicaid Other | Source: Ambulatory Visit | Attending: Obstetrics & Gynecology | Admitting: Obstetrics & Gynecology

## 2015-10-30 DIAGNOSIS — R109 Unspecified abdominal pain: Secondary | ICD-10-CM

## 2015-10-30 DIAGNOSIS — O9882 Other maternal infectious and parasitic diseases complicating childbirth: Secondary | ICD-10-CM | POA: Diagnosis not present

## 2015-10-30 DIAGNOSIS — Z87891 Personal history of nicotine dependence: Secondary | ICD-10-CM | POA: Diagnosis not present

## 2015-10-30 DIAGNOSIS — O26899 Other specified pregnancy related conditions, unspecified trimester: Secondary | ICD-10-CM

## 2015-10-30 DIAGNOSIS — O9989 Other specified diseases and conditions complicating pregnancy, childbirth and the puerperium: Secondary | ICD-10-CM

## 2015-10-30 DIAGNOSIS — B373 Candidiasis of vulva and vagina: Secondary | ICD-10-CM | POA: Diagnosis not present

## 2015-10-30 DIAGNOSIS — R102 Pelvic and perineal pain: Secondary | ICD-10-CM | POA: Insufficient documentation

## 2015-10-30 DIAGNOSIS — Z3A22 22 weeks gestation of pregnancy: Secondary | ICD-10-CM | POA: Diagnosis not present

## 2015-10-30 DIAGNOSIS — N949 Unspecified condition associated with female genital organs and menstrual cycle: Secondary | ICD-10-CM

## 2015-10-30 DIAGNOSIS — O98812 Other maternal infectious and parasitic diseases complicating pregnancy, second trimester: Secondary | ICD-10-CM

## 2015-10-30 DIAGNOSIS — B3731 Acute candidiasis of vulva and vagina: Secondary | ICD-10-CM

## 2015-10-30 LAB — WET PREP, GENITAL
Clue Cells Wet Prep HPF POC: NONE SEEN
SPERM: NONE SEEN
TRICH WET PREP: NONE SEEN

## 2015-10-30 LAB — URINALYSIS, ROUTINE W REFLEX MICROSCOPIC
Bilirubin Urine: NEGATIVE
Glucose, UA: NEGATIVE mg/dL
Hgb urine dipstick: NEGATIVE
Ketones, ur: NEGATIVE mg/dL
Leukocytes, UA: NEGATIVE
Nitrite: NEGATIVE
Protein, ur: NEGATIVE mg/dL
Specific Gravity, Urine: 1.015 (ref 1.005–1.030)
pH: 7 (ref 5.0–8.0)

## 2015-10-30 MED ORDER — TERCONAZOLE 0.8 % VA CREA: 1.0000 | TOPICAL_CREAM | Freq: Every day | VAGINAL | Status: AC

## 2015-10-30 NOTE — MAU Provider Note (Signed)
History     CSN: [id number] Arrival date and time: 10/30/15 1559  First Provider Initiated Contact with Patient 10/30/15 1704     Chief Complaint  Patient presents with  . Vaginal Pain  . Abdominal Pain   HPI Patient is 19 y.o. G1P0 2230w0d with previously uncomplicated pregnancy here with complaints of vaginal pressure for the past 4 days. Describes lower abdomen/vaginal pain as constant pressure that is worsened by walking, standing and peeing. She Reports fever today of 101. Associated lower back pain. Reports nausea and vomiting for the past 2 days.  No changes in urinary frequency. Does not feel like past UTI. Has not been sexually active since becoming pregnant. Reports diarrhea when she drank milk.  +FM, denies LOF, VB, contractions. + clear vaginal discharge.  UTI Jan 2016 UTI Nov 2016- pan sensitive Ecoli (during pregnancy)  OB History    Gravida Para Term Preterm AB TAB SAB Ectopic Multiple Living   1               Past Medical History  Diagnosis Date  . Bell's palsy   . Bronchitis   . Trichomonas vaginitis   . Gonorrhea   . UTI (urinary tract infection)   . Chlamydia     Past Surgical History  Procedure Laterality Date  . Wisdom tooth extraction      History reviewed. No pertinent family history.  Social History  Substance Use Topics  . Smoking status: Former Smoker -- 0.25 packs/day    Types: Cigarettes    Quit date: 05/30/2015  . Smokeless tobacco: Never Used  . Alcohol Use: Yes     Comment: occasionally   Allergies: No Known Allergies  Prescriptions prior to admission  Medication Sig Dispense Refill Last Dose  . Prenatal Vit-Fe Fumarate-FA (PRENATAL MULTIVITAMIN) TABS tablet Take 1 tablet by mouth daily.   10/30/2015 at Unknown time  . amoxicillin (AMOXIL) 500 MG tablet Take 1 tablet (500 mg total) by mouth 2 (two) times daily. (Patient not taking: Reported on 10/30/2015) 14 tablet 0   . metroNIDAZOLE (FLAGYL) 500 MG tablet Take 1 tablet (500 mg  total) by mouth 2 (two) times daily. (Patient not taking: Reported on 10/02/2015) 14 tablet 0   . Prenat-FeFum-FePo-FA-Omega 3 (CONCEPT DHA) 53.5-38-1 MG CAPS Take 1 tablet by mouth daily. (Patient not taking: Reported on 07/10/2015) 30 capsule 2     Review of Systems  Constitutional: Negative for fever and chills.  Eyes: Negative for blurred vision and double vision.  Respiratory: Negative for cough and shortness of breath.   Cardiovascular: Negative for chest pain and orthopnea.  Gastrointestinal: Negative for nausea and vomiting.  Genitourinary: Negative for dysuria, frequency and flank pain.  Musculoskeletal: Negative for myalgias.  Skin: Negative for rash.  Neurological: Negative for dizziness, tingling, weakness and headaches.  Endo/Heme/Allergies: Does not bruise/bleed easily.  Psychiatric/Behavioral: Negative for depression and suicidal ideas. The patient is not nervous/anxious.    Physical Exam   Blood pressure 106/70, pulse 81, temperature 98.4 F (36.9 C), temperature source Oral, resp. rate 18, last menstrual period 04/11/2015.  Physical Exam  Nursing note and vitals reviewed. Constitutional: She is oriented to person, place, and time. She appears well-developed and well-nourished. No distress.  Pregnant female  HENT:  Head: Normocephalic and atraumatic.  Eyes: Conjunctivae are normal. No scleral icterus.  Neck: Normal range of motion. Neck supple.  Cardiovascular: Normal rate and intact distal pulses.   Respiratory: Effort normal. She exhibits no tenderness.  GI: Soft. There  is no tenderness. There is no rebound and no guarding.  Gravid  Genitourinary: Vagina normal.  Patient with significant pain with entrance of speculum and on manual exam. No CMT.  Thick discharge, white.   Musculoskeletal: Normal range of motion. She exhibits no edema.  Neurological: She is alert and oriented to person, place, and time.  Skin: Skin is warm and dry. No rash noted.  Psychiatric:  She has a normal mood and affect.    MAU Course  Procedures  MDM UA- negative  Wet Prep- + yeast GC/CT-pending  FHR 147  Assessment and Plan  Linda Khan is a 19 y.o. G1P0 at [redacted]w[redacted]d   #Yeast infection - terazol x 7 d  #Round ligament pain - warm baths - reassurance - Tylenol prn   Linda Khan 10/30/2015, 5:04 PM

## 2015-10-30 NOTE — MAU Note (Signed)
Hurting in vagina and lower abd.  When she pees, there is a lot of pressure. Feeling weak and nauseous.

## 2015-10-30 NOTE — Discharge Instructions (Signed)
Round Ligament Pain The round ligament is a cord of muscle and tissue that helps to support the uterus. It can become a source of pain during pregnancy if it becomes stretched or twisted as the baby grows. The pain usually begins in the second trimester of pregnancy, and it can come and go until the baby is delivered. It is not a serious problem, and it does not cause harm to the baby. Round ligament pain is usually a short, sharp, and pinching pain, but it can also be a dull, lingering, and aching pain. The pain is felt in the lower side of the abdomen or in the groin. It usually starts deep in the groin and moves up to the outside of the hip area. Pain can occur with:  A sudden change in position.  Rolling over in bed.  Coughing or sneezing.  Physical activity. HOME CARE INSTRUCTIONS Watch your condition for any changes. Take these steps to help with your pain:  When the pain starts, relax. Then try:  Sitting down.  Flexing your knees up to your abdomen.  Lying on your side with one pillow under your abdomen and another pillow between your legs.  Sitting in a warm bath for 15-20 minutes or until the pain goes away.  Take over-the-counter and prescription medicines only as told by your health care provider.  Move slowly when you sit and stand.  Avoid long walks if they cause pain.  Stop or lessen your physical activities if they cause pain. SEEK MEDICAL CARE IF:  Your pain does not go away with treatment.  You feel pain in your back that you did not have before.  Your medicine is not helping. SEEK IMMEDIATE MEDICAL CARE IF:  You develop a fever or chills.  You develop uterine contractions.  You develop vaginal bleeding.  You develop nausea or vomiting.  You develop diarrhea.  You have pain when you urinate.   This information is not intended to replace advice given to you by your health care provider. Make sure you discuss any questions you have with your health  care provider.   Document Released: 08/22/2008 Document Revised: 02/05/2012 Document Reviewed: 01/20/2015 Elsevier Interactive Patient Education 2016 Elsevier Inc. Monilial Vaginitis Vaginitis in a soreness, swelling and redness (inflammation) of the vagina and vulva. Monilial vaginitis is not a sexually transmitted infection. CAUSES  Yeast vaginitis is caused by yeast (candida) that is normally found in your vagina. With a yeast infection, the candida has overgrown in number to a point that upsets the chemical balance. SYMPTOMS  White, thick vaginal discharge. Swelling, itching, redness and irritation of the vagina and possibly the lips of the vagina (vulva). Burning or painful urination. Painful intercourse. DIAGNOSIS  Things that may contribute to monilial vaginitis are: Postmenopausal and virginal states. Pregnancy. Infections. Being tired, sick or stressed, especially if you had monilial vaginitis in the past. Diabetes. Good control will help lower the chance. Birth control pills. Tight fitting garments. Using bubble bath, feminine sprays, douches or deodorant tampons. Taking certain medications that kill germs (antibiotics). Sporadic recurrence can occur if you become ill. TREATMENT  Your caregiver will give you medication. There are several kinds of anti monilial vaginal creams and suppositories specific for monilial vaginitis. For recurrent yeast infections, use a suppository or cream in the vagina 2 times a week, or as directed. Anti-monilial or steroid cream for the itching or irritation of the vulva may also be used. Get your caregiver's permission. Painting the vagina with methylene  blue solution may help if the monilial cream does not work. Eating yogurt may help prevent monilial vaginitis. HOME CARE INSTRUCTIONS  Finish all medication as prescribed. Do not have sex until treatment is completed or after your caregiver tells you it is okay. Take warm sitz baths. Do not  douche. Do not use tampons, especially scented ones. Wear cotton underwear. Avoid tight pants and panty hose. Tell your sexual partner that you have a yeast infection. They should go to their caregiver if they have symptoms such as mild rash or itching. Your sexual partner should be treated as well if your infection is difficult to eliminate. Practice safer sex. Use condoms. Some vaginal medications cause latex condoms to fail. Vaginal medications that harm condoms are: Cleocin cream. Butoconazole (Femstat). Terconazole (Terazol) vaginal suppository. Miconazole (Monistat) (may be purchased over the counter). SEEK MEDICAL CARE IF:  You have a temperature by mouth above 102 F (38.9 C). The infection is getting worse after 2 days of treatment. The infection is not getting better after 3 days of treatment. You develop blisters in or around your vagina. You develop vaginal bleeding, and it is not your menstrual period. You have pain when you urinate. You develop intestinal problems. You have pain with sexual intercourse.   This information is not intended to replace advice given to you by your health care provider. Make sure you discuss any questions you have with your health care provider.   Document Released: 08/23/2005 Document Revised: 02/05/2012 Document Reviewed: 05/17/2015 Elsevier Interactive Patient Education [employer]

## 2015-11-01 LAB — GC/CHLAMYDIA PROBE AMP (~~LOC~~) NOT AT ARMC
Chlamydia: NEGATIVE
Neisseria Gonorrhea: NEGATIVE

## 2015-11-28 NOTE — L&D Delivery Note (Signed)
Patient is 20 y.o. G1P0 6372w2d admitted SOL, hx of +THC in early pregnancy.   Delivery Note At 12:08 AM a viable female was delivered via Vaginal, Spontaneous Delivery (Presentation: LOA).  APGAR: 8,8; weight Pending.  Infant dried and placed on mother's abdomen.  Cord clamped and cut by myself.  Cord blood donation collected by Zerita Boersarlene Breniyah Romm, CNM.  Hospital cord blood sample collected.  Placenta delivered, fundal massage applied, and vagina inspected for lacerations.  Second degree perineal appreciated.  Placenta status: Intact, Spontaneous.  Cord: 3 vessels with the following complications: None.    Anesthesia: Epidural  Episiotomy: None Lacerations: 2nd degree  Suture Repair: 3.0 vicryl Est. Blood Loss (mL):  100cc  Mom to postpartum.  Baby to Couplet care / Skin to Skin.  Zerita Boersarlene Izsak Meir, CNM, was gloved and actively participated in the entirety of this delivery.  Delynn FlavinAshly Gottschalk, DO 03/06/2016, 12:31 AM

## 2016-02-08 ENCOUNTER — Encounter (HOSPITAL_COMMUNITY): Payer: Self-pay | Admitting: *Deleted

## 2016-02-08 ENCOUNTER — Inpatient Hospital Stay (HOSPITAL_COMMUNITY)
Admission: AD | Admit: 2016-02-08 | Discharge: 2016-02-08 | Disposition: A | Discharge status: Home / Self Care | Payer: Medicaid Other | Source: Ambulatory Visit | Attending: Obstetrics & Gynecology | Admitting: Obstetrics & Gynecology

## 2016-02-08 DIAGNOSIS — O479 False labor, unspecified: Secondary | ICD-10-CM

## 2016-02-08 DIAGNOSIS — O4703 False labor before 37 completed weeks of gestation, third trimester: Secondary | ICD-10-CM | POA: Diagnosis not present

## 2016-02-08 DIAGNOSIS — Z3A36 36 weeks gestation of pregnancy: Secondary | ICD-10-CM | POA: Diagnosis not present

## 2016-02-08 DIAGNOSIS — R102 Pelvic and perineal pain: Secondary | ICD-10-CM | POA: Diagnosis present

## 2016-02-08 DIAGNOSIS — Z87891 Personal history of nicotine dependence: Secondary | ICD-10-CM | POA: Diagnosis not present

## 2016-02-08 LAB — URINALYSIS, ROUTINE W REFLEX MICROSCOPIC
BILIRUBIN URINE: NEGATIVE
Glucose, UA: NEGATIVE mg/dL
HGB URINE DIPSTICK: NEGATIVE
Ketones, ur: 15 mg/dL — AB
Leukocytes, UA: NEGATIVE
NITRITE: NEGATIVE
PROTEIN: NEGATIVE mg/dL
Specific Gravity, Urine: 1.025 (ref 1.005–1.030)
pH: 6.5 (ref 5.0–8.0)

## 2016-02-08 MED ORDER — OXYCODONE-ACETAMINOPHEN 7.5-325 MG PO TABS
1.0000 | ORAL_TABLET | Freq: Once | ORAL | Status: AC
  Administered 2016-02-08: 1 via ORAL
  Filled 2016-02-08: qty 1

## 2016-02-08 NOTE — Discharge Instructions (Signed)
Braxton Hicks Contractions °Contractions of the uterus can occur throughout pregnancy. Contractions are not always a sign that you are in labor.  °WHAT ARE BRAXTON HICKS CONTRACTIONS?  °Contractions that occur before labor are called Braxton Hicks contractions, or false labor. Toward the end of pregnancy (32-34 weeks), these contractions can develop more often and may become more forceful. This is not true labor because these contractions do not result in opening (dilatation) and thinning of the cervix. They are sometimes difficult to tell apart from true labor because these contractions can be forceful and people have different pain tolerances. You should not feel embarrassed if you go to the hospital with false labor. Sometimes, the only way to tell if you are in true labor is for your health care provider to look for changes in the cervix. °If there are no prenatal problems or other health problems associated with the pregnancy, it is completely safe to be sent home with false labor and await the onset of true labor. °HOW CAN YOU TELL THE DIFFERENCE BETWEEN TRUE AND FALSE LABOR? °False Labor °· The contractions of false labor are usually shorter and not as hard as those of true labor.   °· The contractions are usually irregular.   °· The contractions are often felt in the front of the lower abdomen and in the groin.   °· The contractions may go away when you walk around or change positions while lying down.   °· The contractions get weaker and are shorter lasting as time goes on.   °· The contractions do not usually become progressively stronger, regular, and closer together as with true labor.   °True Labor °· Contractions in true labor last 30-70 seconds, become very regular, usually become more intense, and increase in frequency.   °· The contractions do not go away with walking.   °· The discomfort is usually felt in the top of the uterus and spreads to the lower abdomen and low back.   °· True labor can be  determined by your health care provider with an exam. This will show that the cervix is dilating and getting thinner.   °WHAT TO REMEMBER °· Keep up with your usual exercises and follow other instructions given by your health care provider.   °· Take medicines as directed by your health care provider.   °· Keep your regular prenatal appointments.   °· Eat and drink lightly if you think you are going into labor.   °· If Braxton Hicks contractions are making you uncomfortable:   °¨ Change your position from lying down or resting to walking, or from walking to resting.   °¨ Sit and rest in a tub of warm water.   °¨ Drink 2-3 glasses of water. Dehydration may cause these contractions.   °¨ Do slow and deep breathing several times an hour.   °WHEN SHOULD I SEEK IMMEDIATE MEDICAL CARE? °Seek immediate medical care if: °· Your contractions become stronger, more regular, and closer together.   °· You have fluid leaking or gushing from your vagina.   °· You have a fever.   °· You pass blood-tinged mucus.   °· You have vaginal bleeding.   °· You have continuous abdominal pain.   °· You have low back pain that you never had before.   °· You feel your baby's head pushing down and causing pelvic pressure.   °· Your baby is not moving as much as it used to.   °  °This information is not intended to replace advice given to you by your health care provider. Make sure you discuss any questions you have with your health care   provider. °  °Document Released: 11/13/2005 Document Revised: 11/18/2013 Document Reviewed: 08/25/2013 °Elsevier Interactive Patient Education ©2016 [employer] Inc[employer] ° °

## 2016-02-08 NOTE — MAU Provider Note (Signed)
MAU HISTORY AND PHYSICAL  Chief Complaint:  Pelvic Pain   Linda Khan is a 20 y.o.  G1P0 with IUP at 493w3d presenting for Pelvic Pain  Began 3 this afternoon. Constant. Down "in my vagina." Nothing that comes and goes. Worse with lying back. No vaginal bleeding. No leakage of fluid. No dysuria or hematuria. Hasn't had a recent cervical exam. Does also describe abdominal tightening sometimes with the pain.   Past Medical History  Diagnosis Date  . Bell's palsy   . Bronchitis   . Trichomonas vaginitis   . Gonorrhea   . UTI (urinary tract infection)   . Chlamydia   . Bell's palsy     Past Surgical History  Procedure Laterality Date  . Wisdom tooth extraction      Family History  Problem Relation Age of Onset  . Diabetes Father   . Diabetes Maternal Aunt   . Diabetes Maternal Grandmother   . Diabetes Paternal Grandmother     Social History  Substance Use Topics  . Smoking status: Former Smoker -- 0.25 packs/day    Types: Cigarettes    Quit date: 05/30/2015  . Smokeless tobacco: Never Used  . Alcohol Use: Yes     Comment: occasionally    No Known Allergies  Prescriptions prior to admission  Medication Sig Dispense Refill Last Dose  . Prenatal Vit-Fe Fumarate-FA (PRENATAL MULTIVITAMIN) TABS tablet Take 1 tablet by mouth daily.   Past Week at Unknown time    Review of Systems - Negative except for what is mentioned in HPI.  Physical Exam  Blood pressure 107/65, pulse 84, temperature 98.5 F (36.9 C), temperature source Oral, resp. rate 16, last menstrual period 04/11/2015. GENERAL: Well-developed, well-nourished female in no acute distress.  LUNGS: Clear to auscultation bilaterally.  HEART: Regular rate and rhythm. ABDOMEN: Soft, nontender, nondistended, gravid.  EXTREMITIES: Nontender, no edema, 2+ distal pulses. Cervical Exam: 1/thick/-2 Presentation: cephalic FHT:  145/mod/+a/-d Contractions: irregular   Labs: Results for orders placed or performed  during the hospital encounter of 02/08/16 (from the past 24 hour(s))  Urinalysis, Routine w reflex microscopic (not at [facility])   Collection Time: 02/08/16  5:40 PM  Result Value Ref Range   Color, Urine YELLOW YELLOW   APPearance HAZY (A) CLEAR   Specific Gravity, Urine 1.025 1.005 - 1.030   pH 6.5 5.0 - 8.0   Glucose, UA NEGATIVE NEGATIVE mg/dL   Hgb urine dipstick NEGATIVE NEGATIVE   Bilirubin Urine NEGATIVE NEGATIVE   Ketones, ur 15 (A) NEGATIVE mg/dL   Protein, ur NEGATIVE NEGATIVE mg/dL   Nitrite NEGATIVE NEGATIVE   Leukocytes, UA NEGATIVE NEGATIVE    Imaging Studies:  No results found.  Assessment: Linda Khan is  20 y.o. G1P0 at 7093w3d presents with braxton hicks contractions. NST reactive, cervix 1 cm dilated and long, unchanged on repeat exam 90 minutes later. No signs abruption or pprom. Symptoms much improved with fluids and percocet (given as did not tolerate well initial cervical exam).  Plan: - ptl, pprom, and abruption return precatuions - push fluids, warm bath, tylenol, etc. - ob f/u 2 days as scheduled  Silvano Bilisoah B Clemma Johnsen 3/14/20177:05 PM

## 2016-02-08 NOTE — MAU Note (Signed)
Pt C/O vaginal & pelvic pain since she woke up @ 1500, denies bleeding or LOF.

## 2016-02-10 LAB — OB RESULTS CONSOLE GC/CHLAMYDIA
Chlamydia: NEGATIVE
GC PROBE AMP, GENITAL: NEGATIVE

## 2016-02-14 LAB — OB RESULTS CONSOLE GBS: GBS: NEGATIVE

## 2016-02-22 ENCOUNTER — Encounter (HOSPITAL_COMMUNITY): Payer: Self-pay

## 2016-02-22 ENCOUNTER — Inpatient Hospital Stay (HOSPITAL_COMMUNITY)
Admission: AD | Admit: 2016-02-22 | Discharge: 2016-02-22 | Disposition: A | Discharge status: Home / Self Care | Payer: Medicaid Other | Source: Ambulatory Visit | Attending: Family Medicine | Admitting: Family Medicine

## 2016-02-22 DIAGNOSIS — Z3493 Encounter for supervision of normal pregnancy, unspecified, third trimester: Secondary | ICD-10-CM | POA: Insufficient documentation

## 2016-02-22 MED ORDER — ACETAMINOPHEN 325 MG PO TABS
650.0000 mg | ORAL_TABLET | Freq: Once | ORAL | Status: AC
  Administered 2016-02-22: 650 mg via ORAL
  Filled 2016-02-22: qty 2

## 2016-02-22 NOTE — MAU Note (Signed)
Contractions every 3-5 mins. Denies LOF or vag bleeding. +FM. Was 1cm 2-3 weeks ago

## 2016-02-22 NOTE — MAU Note (Signed)
Pt may be discharged after reactive NST per Dr Adrian BlackwaterStinson

## 2016-02-22 NOTE — Discharge Instructions (Signed)
Braxton Hicks Contractions °Contractions of the uterus can occur throughout pregnancy. Contractions are not always a sign that you are in labor.  °WHAT ARE BRAXTON HICKS CONTRACTIONS?  °Contractions that occur before labor are called Braxton Hicks contractions, or false labor. Toward the end of pregnancy (32-34 weeks), these contractions can develop more often and may become more forceful. This is not true labor because these contractions do not result in opening (dilatation) and thinning of the cervix. They are sometimes difficult to tell apart from true labor because these contractions can be forceful and people have different pain tolerances. You should not feel embarrassed if you go to the hospital with false labor. Sometimes, the only way to tell if you are in true labor is for your health care provider to look for changes in the cervix. °If there are no prenatal problems or other health problems associated with the pregnancy, it is completely safe to be sent home with false labor and await the onset of true labor. °HOW CAN YOU TELL THE DIFFERENCE BETWEEN TRUE AND FALSE LABOR? °False Labor °· The contractions of false labor are usually shorter and not as hard as those of true labor.   °· The contractions are usually irregular.   °· The contractions are often felt in the front of the lower abdomen and in the groin.   °· The contractions may go away when you walk around or change positions while lying down.   °· The contractions get weaker and are shorter lasting as time goes on.   °· The contractions do not usually become progressively stronger, regular, and closer together as with true labor.   °True Labor °1. Contractions in true labor last 30-70 seconds, become very regular, usually become more intense, and increase in frequency.   °2. The contractions do not go away with walking.   °3. The discomfort is usually felt in the top of the uterus and spreads to the lower abdomen and low back.   °4. True labor can  be determined by your health care provider with an exam. This will show that the cervix is dilating and getting thinner.   °WHAT TO REMEMBER °· Keep up with your usual exercises and follow other instructions given by your health care provider.   °· Take medicines as directed by your health care provider.   °· Keep your regular prenatal appointments.   °· Eat and drink lightly if you think you are going into labor.   °· If Braxton Hicks contractions are making you uncomfortable:   °· Change your position from lying down or resting to walking, or from walking to resting.   °· Sit and rest in a tub of warm water.   °· Drink 2-3 glasses of water. Dehydration may cause these contractions.   °· Do slow and deep breathing several times an hour.   °WHEN SHOULD I SEEK IMMEDIATE MEDICAL CARE? °Seek immediate medical care if: °· Your contractions become stronger, more regular, and closer together.   °· You have fluid leaking or gushing from your vagina.   °· You have a fever.   °· You pass blood-tinged mucus.   °· You have vaginal bleeding.   °· You have continuous abdominal pain.   °· You have low back pain that you never had before.   °· You feel your baby's head pushing down and causing pelvic pressure.   °· Your baby is not moving as much as it used to.   °  °This information is not intended to replace advice given to you by your health care provider. Make sure you discuss any questions you have with your health care   provider. °  °Document Released: 11/13/2005 Document Revised: 11/18/2013 Document Reviewed: 08/25/2013 °Elsevier Interactive Patient Education ©2016 [employer] Inc. ° °Fetal Movement Counts °Patient Name: __________________________________________________ Patient Due Date: ____________________ °Performing a fetal movement count is highly recommended in high-risk pregnancies, but it is good for every pregnant woman to do. Your health care provider may ask you to start counting fetal movements at 28 weeks of the  pregnancy. Fetal movements often increase: °· After eating a full meal. °· After physical activity. °· After eating or drinking something sweet or cold. °· At rest. °Pay attention to when you feel the baby is most active. This will help you notice a pattern of your baby's sleep and wake cycles and what factors contribute to an increase in fetal movement. It is important to perform a fetal movement count at the same time each day when your baby is normally most active.  °HOW TO COUNT FETAL MOVEMENTS °5. Find a quiet and comfortable area to sit or lie down on your left side. Lying on your left side provides the best blood and oxygen circulation to your baby. °6. Write down the day and time on a sheet of paper or in a journal. °7. Start counting kicks, flutters, swishes, rolls, or jabs in a 2-hour period. You should feel at least 10 movements within 2 hours. °8. If you do not feel 10 movements in 2 hours, wait 2-3 hours and count again. Look for a change in the pattern or not enough counts in 2 hours. °SEEK MEDICAL CARE IF: °· You feel less than 10 counts in 2 hours, tried twice. °· There is no movement in over an hour. °· The pattern is changing or taking longer each day to reach 10 counts in 2 hours. °· You feel the baby is not moving as he or she usually does. °Date: ____________ Movements: ____________ Start time: ____________ Finish time: ____________  °Date: ____________ Movements: ____________ Start time: ____________ Finish time: ____________ °Date: ____________ Movements: ____________ Start time: ____________ Finish time: ____________ °Date: ____________ Movements: ____________ Start time: ____________ Finish time: ____________ °Date: ____________ Movements: ____________ Start time: ____________ Finish time: ____________ °Date: ____________ Movements: ____________ Start time: ____________ Finish time: ____________ °Date: ____________ Movements: ____________ Start time: ____________ Finish time:  ____________ °Date: ____________ Movements: ____________ Start time: ____________ Finish time: ____________  °Date: ____________ Movements: ____________ Start time: ____________ Finish time: ____________ °Date: ____________ Movements: ____________ Start time: ____________ Finish time: ____________ °Date: ____________ Movements: ____________ Start time: ____________ Finish time: ____________ °Date: ____________ Movements: ____________ Start time: ____________ Finish time: ____________ °Date: ____________ Movements: ____________ Start time: ____________ Finish time: ____________ °Date: ____________ Movements: ____________ Start time: ____________ Finish time: ____________ °Date: ____________ Movements: ____________ Start time: ____________ Finish time: ____________  °Date: ____________ Movements: ____________ Start time: ____________ Finish time: ____________ °Date: ____________ Movements: ____________ Start time: ____________ Finish time: ____________ °Date: ____________ Movements: ____________ Start time: ____________ Finish time: ____________ °Date: ____________ Movements: ____________ Start time: ____________ Finish time: ____________ °Date: ____________ Movements: ____________ Start time: ____________ Finish time: ____________ °Date: ____________ Movements: ____________ Start time: ____________ Finish time: ____________ °Date: ____________ Movements: ____________ Start time: ____________ Finish time: ____________  °Date: ____________ Movements: ____________ Start time: ____________ Finish time: ____________ °Date: ____________ Movements: ____________ Start time: ____________ Finish time: ____________ °Date: ____________ Movements: ____________ Start time: ____________ Finish time: ____________ °Date: ____________ Movements: ____________ Start time: ____________ Finish time: ____________ °Date: ____________ Movements: ____________ Start time: ____________ Finish time: ____________ °Date: ____________ Movements:  ____________ Start time: ____________ Finish   time: ____________ °Date: ____________ Movements: ____________ Start time: ____________ Finish time: ____________  °Date: ____________ Movements: ____________ Start time: ____________ Finish time: ____________ °Date: ____________ Movements: ____________ Start time: ____________ Finish time: ____________ °Date: ____________ Movements: ____________ Start time: ____________ Finish time: ____________ °Date: ____________ Movements: ____________ Start time: ____________ Finish time: ____________ °Date: ____________ Movements: ____________ Start time: ____________ Finish time: ____________ °Date: ____________ Movements: ____________ Start time: ____________ Finish time: ____________ °Date: ____________ Movements: ____________ Start time: ____________ Finish time: ____________  °Date: ____________ Movements: ____________ Start time: ____________ Finish time: ____________ °Date: ____________ Movements: ____________ Start time: ____________ Finish time: ____________ °Date: ____________ Movements: ____________ Start time: ____________ Finish time: ____________ °Date: ____________ Movements: ____________ Start time: ____________ Finish time: ____________ °Date: ____________ Movements: ____________ Start time: ____________ Finish time: ____________ °Date: ____________ Movements: ____________ Start time: ____________ Finish time: ____________ °Date: ____________ Movements: ____________ Start time: ____________ Finish time: ____________  °Date: ____________ Movements: ____________ Start time: ____________ Finish time: ____________ °Date: ____________ Movements: ____________ Start time: ____________ Finish time: ____________ °Date: ____________ Movements: ____________ Start time: ____________ Finish time: ____________ °Date: ____________ Movements: ____________ Start time: ____________ Finish time: ____________ °Date: ____________ Movements: ____________ Start time: ____________ Finish  time: ____________ °Date: ____________ Movements: ____________ Start time: ____________ Finish time: ____________ °Date: ____________ Movements: ____________ Start time: ____________ Finish time: ____________  °Date: ____________ Movements: ____________ Start time: ____________ Finish time: ____________ °Date: ____________ Movements: ____________ Start time: ____________ Finish time: ____________ °Date: ____________ Movements: ____________ Start time: ____________ Finish time: ____________ °Date: ____________ Movements: ____________ Start time: ____________ Finish time: ____________ °Date: ____________ Movements: ____________ Start time: ____________ Finish time: ____________ °Date: ____________ Movements: ____________ Start time: ____________ Finish time: ____________ °  °This information is not intended to replace advice given to you by your health care provider. Make sure you discuss any questions you have with your health care provider. °  °Document Released: 12/13/2006 Document Revised: 12/04/2014 Document Reviewed: 09/09/2012 °Elsevier Interactive Patient Education ©2016 [employer] Inc. ° °

## 2016-03-01 ENCOUNTER — Encounter (HOSPITAL_COMMUNITY): Payer: Self-pay | Admitting: *Deleted

## 2016-03-01 ENCOUNTER — Inpatient Hospital Stay (HOSPITAL_COMMUNITY)
Admission: AD | Admit: 2016-03-01 | Discharge: 2016-03-01 | Disposition: A | Discharge status: Home / Self Care | Payer: Medicaid Other | Source: Ambulatory Visit | Attending: Obstetrics and Gynecology | Admitting: Obstetrics and Gynecology

## 2016-03-01 DIAGNOSIS — Z3A39 39 weeks gestation of pregnancy: Secondary | ICD-10-CM | POA: Diagnosis not present

## 2016-03-01 DIAGNOSIS — G510 Bell's palsy: Secondary | ICD-10-CM | POA: Diagnosis not present

## 2016-03-01 DIAGNOSIS — Z87891 Personal history of nicotine dependence: Secondary | ICD-10-CM | POA: Diagnosis not present

## 2016-03-01 DIAGNOSIS — O471 False labor at or after 37 completed weeks of gestation: Secondary | ICD-10-CM | POA: Diagnosis not present

## 2016-03-01 DIAGNOSIS — O479 False labor, unspecified: Secondary | ICD-10-CM

## 2016-03-01 DIAGNOSIS — O368130 Decreased fetal movements, third trimester, not applicable or unspecified: Secondary | ICD-10-CM | POA: Insufficient documentation

## 2016-03-01 NOTE — MAU Note (Signed)
Pt. States baby has not been moving well since yesterday. Pt. States [facility]. Also, feels that she is passing her mucous plug and it is brown and white at times. Denies LOF. Pt. States she was 2 cm the last time she was checked. Pt. Is experiencing lower abdominal cramps and feels that they have been constant. Pt. Noted she took caster oil yesterday to help induce her labor.

## 2016-03-01 NOTE — Discharge Instructions (Signed)
Braxton Hicks Contractions °Contractions of the uterus can occur throughout pregnancy. Contractions are not always a sign that you are in labor.  °WHAT ARE BRAXTON HICKS CONTRACTIONS?  °Contractions that occur before labor are called Braxton Hicks contractions, or false labor. Toward the end of pregnancy (32-34 weeks), these contractions can develop more often and may become more forceful. This is not true labor because these contractions do not result in opening (dilatation) and thinning of the cervix. They are sometimes difficult to tell apart from true labor because these contractions can be forceful and people have different pain tolerances. You should not feel embarrassed if you go to the hospital with false labor. Sometimes, the only way to tell if you are in true labor is for your health care provider to look for changes in the cervix. °If there are no prenatal problems or other health problems associated with the pregnancy, it is completely safe to be sent home with false labor and await the onset of true labor. °HOW CAN YOU TELL THE DIFFERENCE BETWEEN TRUE AND FALSE LABOR? °False Labor °· The contractions of false labor are usually shorter and not as hard as those of true labor.   °· The contractions are usually irregular.   °· The contractions are often felt in the front of the lower abdomen and in the groin.   °· The contractions may go away when you walk around or change positions while lying down.   °· The contractions get weaker and are shorter lasting as time goes on.   °· The contractions do not usually become progressively stronger, regular, and closer together as with true labor.   °True Labor °· Contractions in true labor last 30-70 seconds, become very regular, usually become more intense, and increase in frequency.   °· The contractions do not go away with walking.   °· The discomfort is usually felt in the top of the uterus and spreads to the lower abdomen and low back.   °· True labor can be  determined by your health care provider with an exam. This will show that the cervix is dilating and getting thinner.   °WHAT TO REMEMBER °· Keep up with your usual exercises and follow other instructions given by your health care provider.   °· Take medicines as directed by your health care provider.   °· Keep your regular prenatal appointments.   °· Eat and drink lightly if you think you are going into labor.   °· If Braxton Hicks contractions are making you uncomfortable:   °¨ Change your position from lying down or resting to walking, or from walking to resting.   °¨ Sit and rest in a tub of warm water.   °¨ Drink 2-3 glasses of water. Dehydration may cause these contractions.   °¨ Do slow and deep breathing several times an hour.   °WHEN SHOULD I SEEK IMMEDIATE MEDICAL CARE? °Seek immediate medical care if: °· Your contractions become stronger, more regular, and closer together.   °· You have fluid leaking or gushing from your vagina.   °· You have a fever.   °· You pass blood-tinged mucus.   °· You have vaginal bleeding.   °· You have continuous abdominal pain.   °· You have low back pain that you never had before.   °· You feel your baby's head pushing down and causing pelvic pressure.   °· Your baby is not moving as much as it used to.   °  °This information is not intended to replace advice given to you by your health care provider. Make sure you discuss any questions you have with your health care   provider. °  °Document Released: 11/13/2005 Document Revised: 11/18/2013 Document Reviewed: 08/25/2013 °Elsevier Interactive Patient Education ©2016 [employer] Inc[employer] ° °

## 2016-03-01 NOTE — MAU Provider Note (Signed)
  History     CSN: [id number]  Arrival date and time: 03/01/16 2204   First Provider Initiated Contact with Patient 03/01/16 2251      No chief complaint on file.  HPI Ms Mickle MalloryDonnell is a 20yo G1 @ 39.4wks who presents for eval of decreased FM this evening. Denies leaking or bldg. Reports +FM since being here. No H/A or s/s PIH. [facility] and has been remarkable for 1) Bell's palsy  OB History    Gravida Para Term Preterm AB TAB SAB Ectopic Multiple Living   1               Past Medical History  Diagnosis Date  . Bell's palsy   . Bronchitis   . Trichomonas vaginitis   . Gonorrhea   . UTI (urinary tract infection)   . Chlamydia   . Bell's palsy     Past Surgical History  Procedure Laterality Date  . Wisdom tooth extraction      Family History  Problem Relation Age of Onset  . Diabetes Father   . Diabetes Maternal Aunt   . Diabetes Maternal Grandmother   . Diabetes Paternal Grandmother     Social History  Substance Use Topics  . Smoking status: Former Smoker -- 0.25 packs/day    Types: Cigarettes    Quit date: 05/30/2015  . Smokeless tobacco: Never Used  . Alcohol Use: No     Comment: occasionally    Allergies: No Known Allergies  Prescriptions prior to admission  Medication Sig Dispense Refill Last Dose  . Prenatal Vit-Fe Fumarate-FA (PRENATAL MULTIVITAMIN) TABS tablet Take 1 tablet by mouth daily.   More than a month at Unknown time    ROS No other pertinents other than what is listed in HPI Physical Exam   Blood pressure 113/85, pulse 91, temperature 98.2 F (36.8 C), temperature source Oral, resp. rate 18, height 5\' 3"  (1.6 m), weight 62.143 kg (137 lb), last menstrual period 04/11/2015.  Physical Exam  Constitutional: She is oriented to person, place, and time. She appears well-developed.  HENT:  Head: Normocephalic.  Neck: Normal range of motion.  Cardiovascular: Normal rate.   Respiratory: Effort normal.  GI:  EFM  120s, +accels, no decels Irreg ctx q 2-7 mins; palp mild  Musculoskeletal: Normal range of motion.  Neurological: She is alert and oriented to person, place, and time.  Skin: Skin is warm and dry.  Psychiatric: She has a normal mood and affect. Her behavior is normal. Thought content normal.    MAU Course  Procedures  MDM NST read  Assessment and Plan  IUP@term  Braxton Hicks ctx Decreased FM earlier  D/C home w/ labor precautions F/U as scheduled at next visit tomorrow  Cam HaiSHAW, KIMBERLY CNM 03/01/2016, 11:02 PM

## 2016-03-04 ENCOUNTER — Inpatient Hospital Stay (HOSPITAL_COMMUNITY)
Admission: AD | Admit: 2016-03-04 | Discharge: 2016-03-07 | DRG: 775 | Disposition: A | Discharge status: Home / Self Care | Payer: Medicaid Other | Source: Ambulatory Visit | Attending: Obstetrics & Gynecology | Admitting: Obstetrics & Gynecology

## 2016-03-04 ENCOUNTER — Encounter (HOSPITAL_COMMUNITY): Payer: Self-pay

## 2016-03-04 DIAGNOSIS — Z3A40 40 weeks gestation of pregnancy: Secondary | ICD-10-CM | POA: Diagnosis not present

## 2016-03-04 DIAGNOSIS — Z87891 Personal history of nicotine dependence: Secondary | ICD-10-CM | POA: Diagnosis not present

## 2016-03-04 DIAGNOSIS — F1290 Cannabis use, unspecified, uncomplicated: Secondary | ICD-10-CM | POA: Diagnosis present

## 2016-03-04 DIAGNOSIS — IMO0001 Reserved for inherently not codable concepts without codable children: Secondary | ICD-10-CM

## 2016-03-04 DIAGNOSIS — O99324 Drug use complicating childbirth: Secondary | ICD-10-CM | POA: Diagnosis present

## 2016-03-04 DIAGNOSIS — Z833 Family history of diabetes mellitus: Secondary | ICD-10-CM | POA: Diagnosis not present

## 2016-03-04 LAB — CBC
HCT: 33.8 % — ABNORMAL LOW (ref 36.0–46.0)
HEMOGLOBIN: 11.7 g/dL — AB (ref 12.0–15.0)
MCH: 29.6 pg (ref 26.0–34.0)
MCHC: 34.6 g/dL (ref 30.0–36.0)
MCV: 85.6 fL (ref 78.0–100.0)
Platelets: 178 10*3/uL (ref 150–400)
RBC: 3.95 MIL/uL (ref 3.87–5.11)
RDW: 13.8 % (ref 11.5–15.5)
WBC: 9.2 10*3/uL (ref 4.0–10.5)

## 2016-03-04 MED ORDER — FENTANYL CITRATE (PF) 100 MCG/2ML IJ SOLN
100.0000 ug | INTRAMUSCULAR | Status: DC | PRN
  Administered 2016-03-05 (×2): 100 ug via INTRAVENOUS
  Filled 2016-03-04 (×2): qty 2

## 2016-03-04 MED ORDER — LACTATED RINGERS IV SOLN
2.5000 [IU]/h | INTRAVENOUS | Status: DC
  Filled 2016-03-04: qty 4

## 2016-03-04 MED ORDER — LIDOCAINE HCL (PF) 1 % IJ SOLN
30.0000 mL | INTRAMUSCULAR | Status: DC | PRN
  Filled 2016-03-04: qty 30

## 2016-03-04 MED ORDER — OXYCODONE-ACETAMINOPHEN 5-325 MG PO TABS
1.0000 | ORAL_TABLET | Freq: Once | ORAL | Status: AC
  Administered 2016-03-04: 1 via ORAL
  Filled 2016-03-04: qty 1

## 2016-03-04 MED ORDER — LACTATED RINGERS IV SOLN: 500.0000 mL | INTRAVENOUS | Status: DC | PRN

## 2016-03-04 MED ORDER — OXYTOCIN BOLUS FROM INFUSION
500.0000 mL | INTRAVENOUS | Status: DC
  Administered 2016-03-06: 500 mL via INTRAVENOUS

## 2016-03-04 MED ORDER — CITRIC ACID-SODIUM CITRATE 334-500 MG/5ML PO SOLN: 30.0000 mL | ORAL | Status: DC | PRN

## 2016-03-04 MED ORDER — LACTATED RINGERS IV SOLN
INTRAVENOUS | Status: DC
  Administered 2016-03-05 (×4): via INTRAVENOUS

## 2016-03-04 MED ORDER — ONDANSETRON HCL 4 MG/2ML IJ SOLN: 4.0000 mg | Freq: Four times a day (QID) | INTRAMUSCULAR | Status: DC | PRN

## 2016-03-04 MED ORDER — ACETAMINOPHEN 325 MG PO TABS: 650.0000 mg | ORAL_TABLET | ORAL | Status: DC | PRN

## 2016-03-04 NOTE — MAU Note (Signed)
Pt requesting pain medication. Dr Nadine CountsGottschalk notified
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
## 2016-03-04 NOTE — MAU Note (Signed)
Recheck pt in one hour per Dr Ashok PallWouk

## 2016-03-04 NOTE — MAU Note (Signed)
Pt c/o pain that started around 1800. States it feels like she was "hit with a bat." +FM. Denies vag bleeding or LOF.

## 2016-03-05 ENCOUNTER — Inpatient Hospital Stay (HOSPITAL_COMMUNITY): Payer: Medicaid Other | Admitting: Anesthesiology

## 2016-03-05 ENCOUNTER — Encounter (HOSPITAL_COMMUNITY): Payer: Self-pay

## 2016-03-05 LAB — TYPE AND SCREEN
ABO/RH(D): A POS
ANTIBODY SCREEN: NEGATIVE

## 2016-03-05 LAB — RAPID URINE DRUG SCREEN, HOSP PERFORMED
AMPHETAMINES: NOT DETECTED
BARBITURATES: NOT DETECTED
BENZODIAZEPINES: NOT DETECTED
Cocaine: NOT DETECTED
Opiates: NOT DETECTED
Tetrahydrocannabinol: NOT DETECTED

## 2016-03-05 LAB — RPR: RPR Ser Ql: NONREACTIVE

## 2016-03-05 MED ORDER — PHENYLEPHRINE 40 MCG/ML (10ML) SYRINGE FOR IV PUSH (FOR BLOOD PRESSURE SUPPORT)
80.0000 ug | PREFILLED_SYRINGE | INTRAVENOUS | Status: DC | PRN
  Filled 2016-03-05: qty 2

## 2016-03-05 MED ORDER — LIDOCAINE HCL (PF) 1 % IJ SOLN
INTRAMUSCULAR | Status: DC | PRN
  Administered 2016-03-05 (×2): 4 mL

## 2016-03-05 MED ORDER — FENTANYL 2.5 MCG/ML BUPIVACAINE 1/10 % EPIDURAL INFUSION (WH - ANES): 14.0000 mL/h | INTRAMUSCULAR | Status: DC | PRN

## 2016-03-05 MED ORDER — TERBUTALINE SULFATE 1 MG/ML IJ SOLN
0.2500 mg | Freq: Once | INTRAMUSCULAR | Status: DC | PRN
  Filled 2016-03-05: qty 1

## 2016-03-05 MED ORDER — EPHEDRINE 5 MG/ML INJ
10.0000 mg | INTRAVENOUS | Status: DC | PRN
  Filled 2016-03-05: qty 2

## 2016-03-05 MED ORDER — OXYTOCIN 10 UNIT/ML IJ SOLN
1.0000 m[IU]/min | INTRAVENOUS | Status: DC
  Administered 2016-03-05: 2 m[IU]/min via INTRAVENOUS
  Filled 2016-03-05: qty 4

## 2016-03-05 MED ORDER — PHENYLEPHRINE 40 MCG/ML (10ML) SYRINGE FOR IV PUSH (FOR BLOOD PRESSURE SUPPORT)
80.0000 ug | PREFILLED_SYRINGE | INTRAVENOUS | Status: DC | PRN
Start: 2016-03-05 — End: 2016-03-06
  Filled 2016-03-05: qty 20
  Filled 2016-03-05: qty 2

## 2016-03-05 MED ORDER — LACTATED RINGERS IV SOLN
500.0000 mL | Freq: Once | INTRAVENOUS | Status: AC
  Administered 2016-03-05: 500 mL via INTRAVENOUS

## 2016-03-05 MED ORDER — DIPHENHYDRAMINE HCL 50 MG/ML IJ SOLN: 12.5000 mg | INTRAMUSCULAR | Status: DC | PRN

## 2016-03-05 MED ORDER — FENTANYL 2.5 MCG/ML BUPIVACAINE 1/10 % EPIDURAL INFUSION (WH - ANES)
14.0000 mL/h | INTRAMUSCULAR | Status: DC | PRN
  Administered 2016-03-05 (×2): 14 mL/h via EPIDURAL
  Filled 2016-03-05 (×2): qty 125

## 2016-03-05 NOTE — Consults (Addendum)
  Anesthesia Pain Consult Note  Patient: Linda Khan, 20 y.o., female  Consult Requested by: Adam PhenixJames G Arnold, MD  Reason for Consult: CRNA Pain Management  Planning for epidural Pain 6 Goal 4

## 2016-03-05 NOTE — Anesthesia Procedure Notes (Signed)
Epidural Patient location during procedure: OB  Staffing Anesthesiologist: Kierstin January Performed by: anesthesiologist   Preanesthetic Checklist Completed: patient identified, pre-op evaluation, timeout performed, IV checked, risks and benefits discussed and monitors and equipment checked  Epidural Patient position: sitting Prep: site prepped and draped and DuraPrep Patient monitoring: heart rate Approach: midline Location: L3-L4 Injection technique: LOR air and LOR saline  Needle:  Needle type: Tuohy  Needle gauge: 17 G Needle length: 9 cm Needle insertion depth: 6 cm Catheter type: closed end flexible Catheter size: 19 Gauge Catheter at skin depth: 12 cm Test dose: negative  Assessment Sensory level: T8 Events: blood not aspirated, injection not painful, no injection resistance, negative IV test and no paresthesia  Additional Notes Reason for block:procedure for pain   

## 2016-03-05 NOTE — Progress Notes (Signed)
LABOR PROGRESS NOTE  Linda Khan is a 20 y.o. G1P0 at 1974w1d  admitted for onset of labor  Subjective: Patient notes pain with cervical exams.  Otherwise, no concerns.  Objective: BP 122/57 mmHg  Pulse 70  Temp(Src) 98.6 F (37 C) (Oral)  Resp 18  Ht 5\' 3"  (1.6 m)  Wt 138 lb (62.596 kg)  BMI 24.45 kg/m2  SpO2 99%  LMP 04/11/2015 or  Filed Vitals:   03/05/16 0136 03/05/16 0230 03/05/16 0234 03/05/16 0347  BP: 107/69 92/32 129/64 122/57  Pulse: 78 68 71 70  Temp: 98.5 F (36.9 C)  98 F (36.7 C) 98.6 F (37 C)  TempSrc: Oral  Oral Oral  Resp: 18  18 18   Height:      Weight:      SpO2:        Fetal monitoringBaseline: 130 bpm, Variability: Good {> 6 bpm) and Accelerations: Reactive  Uterine activityFrequency: Every 5-7 minutes   Dilation: 3 Effacement (%): 60 Cervical Position: Middle Station: -2 Presentation: Vertex Exam by:: Dr. Nadine CountsGottschalk  Labs: Lab Results  Component Value Date   WBC 9.2 03/04/2016   HGB 11.7* 03/04/2016   HCT 33.8* 03/04/2016   MCV 85.6 03/04/2016   PLT 178 03/04/2016    Patient Active Problem List   Diagnosis Date Noted  . Active labor 03/04/2016    Assessment / Plan: 20 y.o. G1P0 at 474w1d here for onset of labor, latent phase  Labor: latent phase, no change in cervix. start pitocin 2x2 Fetal Wellbeing:  Cat1 Pain Control:  Plans for epidural Anticipated MOD:  SVD  Delynn FlavinAshly Chrissa Meetze, DO 03/05/2016, 3:49 AM

## 2016-03-05 NOTE — Progress Notes (Signed)
Linda Khan is a 20 y.o. G1P0 at 414w1d by ultrasound admitted for early labor  Subjective:   Objective: BP 116/58 mmHg  Pulse 87  Temp(Src) 98.4 F (36.9 C) (Oral)  Resp 18  Ht 5\' 3"  (1.6 m)  Wt 138 lb (62.596 kg)  BMI 24.45 kg/m2  SpO2 99%  LMP 04/11/2015 I/O last 3 completed shifts: In: -  Out: 375 [Urine:375]    FHT:  FHR: 130's bpm, variability: moderate,  accelerations:  Present,  decelerations:  Absent UC:   regular, every 3-5  Minutes and mild SVE:   Dilation: 3 Effacement (%): 60 Station: -2 Exam by:: Dr. Nadine CountsGottschalk  Labs: Lab Results  Component Value Date   WBC 9.2 03/04/2016   HGB 11.7* 03/04/2016   HCT 33.8* 03/04/2016   MCV 85.6 03/04/2016   PLT 178 03/04/2016    Assessment / Plan: yet to be in good labor pattern  Labor: yet to be in adequate labor Preeclampsia:  no signs or symptoms of toxicity and intake and ouput balanced Fetal Wellbeing:  Category I Pain Control:  IV pain meds I/D:  n/a Anticipated MOD:  NSVD  LAWSON, Linda Khan 03/05/2016, 8:55 AM

## 2016-03-05 NOTE — Anesthesia Preprocedure Evaluation (Signed)
Anesthesia Evaluation  Patient identified by MRN, date of birth, ID band Patient awake    Reviewed: Allergy & Precautions, NPO status , Patient's Chart, lab work & pertinent test results  Airway Mallampati: II  TM Distance: >3 FB Neck ROM: Full    Dental no notable dental hx.    Pulmonary neg pulmonary ROS, former smoker,    Pulmonary exam normal breath sounds clear to auscultation       Cardiovascular negative cardio ROS Normal cardiovascular exam Rhythm:Regular Rate:Normal     Neuro/Psych negative neurological ROS  negative psych ROS   GI/Hepatic negative GI ROS, Neg liver ROS,   Endo/Other  negative endocrine ROS  Renal/GU negative Renal ROS     Musculoskeletal negative musculoskeletal ROS (+)   Abdominal   Peds  Hematology negative hematology ROS (+)   Anesthesia Other Findings   Reproductive/Obstetrics (+) Pregnancy                             Anesthesia Physical Anesthesia Plan  ASA: II  Anesthesia Plan: Epidural   Post-op Pain Management:    Induction:   Airway Management Planned:   Additional Equipment:   Intra-op Plan:   Post-operative Plan:   Informed Consent: I have reviewed the patients History and Physical, chart, labs and discussed the procedure including the risks, benefits and alternatives for the proposed anesthesia with the patient or authorized representative who has indicated his/her understanding and acceptance.     Plan Discussed with:   Anesthesia Plan Comments:         Anesthesia Quick Evaluation  

## 2016-03-05 NOTE — Progress Notes (Signed)
LABOR PROGRESS NOTE  Linda Khan is a 20 y.o. G1P0 at 746w1d  admitted for SOL  Subjective: Patient feeling her contractions more.  Pain described as sharp.  No increase in pelvic pressure.  RN has just checked cervix ~30 mins ago.  Objective: BP 131/74 mmHg  Pulse 83  Temp(Src) 98.5 F (36.9 C) (Oral)  Resp 18  Ht 5\' 3"  (1.6 m)  Wt 138 lb (62.596 kg)  BMI 24.45 kg/m2  SpO2 99%  LMP 04/11/2015 or  Filed Vitals:   03/05/16 2032 03/05/16 2102 03/05/16 2132 03/05/16 2202  BP: 110/60 106/48 119/40 131/74  Pulse: 70 80 76 83  Temp:  98.5 F (36.9 C)    TempSrc:  Oral    Resp: 18 18 18 18   Height:      Weight:      SpO2:       Gen: appears uncomfortable.   Fetal monitoringBaseline: 130 bpm, Variability: Good {> 6 bpm), Accelerations: Reactive and Decelerations: Early Uterine activityFrequency: Every 1.5-4 minutes   Dilation: 6 Effacement (%): 80 Cervical Position: Middle Station: -1, 0 Presentation: Vertex Exam by:: Barrie DunkerMurayyah Johnson RN  Labs: Lab Results  Component Value Date   WBC 9.2 03/04/2016   HGB 11.7* 03/04/2016   HCT 33.8* 03/04/2016   MCV 85.6 03/04/2016   PLT 178 03/04/2016    Patient Active Problem List   Diagnosis Date Noted  . Active labor 03/04/2016    Assessment / Plan: 20 y.o. G1P0 at 546w1d here for SOL.  AROM  Labor: progressing normally Fetal Wellbeing:  Cat 1 Pain Control:  Epidural.  Likely will need titration of dose to relieve pain. Anticipated MOD:  SVD  Delynn FlavinAshly Gottschalk, DO 03/05/2016, 10:20 PM

## 2016-03-05 NOTE — Progress Notes (Signed)
LABOR PROGRESS NOTE  Linda Khan is a 20 y.o. G1P0 at 7066w1d  admitted for SOL  Subjective: Patient resting peacefully  Objective: BP 110/60 mmHg  Pulse 70  Temp(Src) 97.9 F (36.6 C) (Oral)  Resp 18  Ht 5\' 3"  (1.6 m)  Wt 138 lb (62.596 kg)  BMI 24.45 kg/m2  SpO2 99%  LMP 04/11/2015 or  Filed Vitals:   03/05/16 1913 03/05/16 1932 03/05/16 2002 03/05/16 2032  BP: 107/63 116/67 107/59 110/60  Pulse: 69 69 80 70  Temp:      TempSrc:      Resp:  18 18 18   Height:      Weight:      SpO2:        Fetal monitoringBaseline: 135 bpm, Variability: Good {> 6 bpm) and Accelerations: Reactive Uterine activityFrequency: Every 1.5-4 minutes   Dilation: 4 Effacement (%): 80 Cervical Position: Middle Station: -1 Presentation: Vertex Exam by:: Belenda CruiseHeather Mitchell RNC   Labs: Lab Results  Component Value Date   WBC 9.2 03/04/2016   HGB 11.7* 03/04/2016   HCT 33.8* 03/04/2016   MCV 85.6 03/04/2016   PLT 178 03/04/2016    Patient Active Problem List   Diagnosis Date Noted  . Active labor 03/04/2016    Assessment / Plan: 20 y.o. G1P0 at 3766w1d here for SOL, s/p AROM  Labor: progressing normally Fetal Wellbeing:  Cat 1 Pain Control:  epidural Anticipated MOD:  SVD  Delynn FlavinAshly Gottschalk, DO 03/05/2016, 8:49 PM

## 2016-03-05 NOTE — Progress Notes (Signed)
Linda Khan is a 20 y.o. G1P0 at 846w1d by ultrasound admitted for early labor  Subjective:   Objective: BP 103/55 mmHg  Pulse 78  Temp(Src) 98.3 F (36.8 C) (Oral)  Resp 18  Ht 5\' 3"  (1.6 m)  Wt 138 lb (62.596 kg)  BMI 24.45 kg/m2  SpO2 99%  LMP 04/11/2015 I/O last 3 completed shifts: In: -  Out: 375 [Urine:375]    FHT:  FHR: 145-150 bpm, variability: moderate,  accelerations:  Present,  decelerations:  Absent UC:   q 3-4 min and mild SVE:   Dilation: 3 Effacement (%): 60 Station: -2 Exam by:: Kennedy Bucker. Jacobs, RN  Labs: Lab Results  Component Value Date   WBC 9.2 03/04/2016   HGB 11.7* 03/04/2016   HCT 33.8* 03/04/2016   MCV 85.6 03/04/2016   PLT 178 03/04/2016    Assessment / Plan: Augmentation of labor, progressing well  Labor: Progressing on Pitocin, will continue to increase then AROM Preeclampsia:  no signs or symptoms of toxicity and intake and ouput balanced Fetal Wellbeing:  Category I Pain Control:  IV pain meds I/D:  n/a Anticipated MOD:  NSVD  LAWSON, MARIE DARLENE 03/05/2016, 1:21 PM

## 2016-03-05 NOTE — Progress Notes (Signed)
Linda Khan is a 20 y.o. G1P0 at 4632w1d by ultrasound admitted for prodromal labor  Subjective:   Objective: BP 120/64 mmHg  Pulse 70  Temp(Src) 98.5 F (36.9 C) (Oral)  Resp 18  Ht 5\' 3"  (1.6 m)  Wt 138 lb (62.596 kg)  BMI 24.45 kg/m2  SpO2 99%  LMP 04/11/2015 I/O last 3 completed shifts: In: -  Out: 375 [Urine:375]    FHT:  FHR: 125 bpm, variability: moderate,  accelerations:  Present,  decelerations:  Absent UC:   regular, every 2 minutes SVE:   Dilation: 4 Effacement (%): 70 Station: -1, 0 Exam by:: Kennedy Bucker. Jacobs, RN  Labs: Lab Results  Component Value Date   WBC 9.2 03/04/2016   HGB 11.7* 03/04/2016   HCT 33.8* 03/04/2016   MCV 85.6 03/04/2016   PLT 178 03/04/2016    Assessment / Plan: Augmentation of labor, progressing well  Labor: Progressing normally Preeclampsia:  no signs or symptoms of toxicity and intake and ouput balanced Fetal Wellbeing:  Category I Pain Control:  Iv meds I/D:  n/a Anticipated MOD:  NSVD  [name] DARLENE 03/05/2016, 5:21 PM

## 2016-03-06 ENCOUNTER — Encounter (HOSPITAL_COMMUNITY): Payer: Self-pay

## 2016-03-06 DIAGNOSIS — O99324 Drug use complicating childbirth: Secondary | ICD-10-CM

## 2016-03-06 DIAGNOSIS — Z3A40 40 weeks gestation of pregnancy: Secondary | ICD-10-CM

## 2016-03-06 DIAGNOSIS — Z87891 Personal history of nicotine dependence: Secondary | ICD-10-CM

## 2016-03-06 LAB — CBC
HCT: 30.4 % — ABNORMAL LOW (ref 36.0–46.0)
HEMOGLOBIN: 10.6 g/dL — AB (ref 12.0–15.0)
MCH: 29.5 pg (ref 26.0–34.0)
MCHC: 34.9 g/dL (ref 30.0–36.0)
MCV: 84.7 fL (ref 78.0–100.0)
Platelets: 128 10*3/uL — ABNORMAL LOW (ref 150–400)
RBC: 3.59 MIL/uL — AB (ref 3.87–5.11)
RDW: 13.6 % (ref 11.5–15.5)
WBC: 9.9 10*3/uL (ref 4.0–10.5)

## 2016-03-06 MED ORDER — PRENATAL MULTIVITAMIN CH
1.0000 | ORAL_TABLET | Freq: Every day | ORAL | Status: DC
  Administered 2016-03-06 – 2016-03-07 (×2): 1 via ORAL
  Filled 2016-03-06 (×2): qty 1

## 2016-03-06 MED ORDER — SIMETHICONE 80 MG PO CHEW: 80.0000 mg | CHEWABLE_TABLET | ORAL | Status: DC | PRN

## 2016-03-06 MED ORDER — IBUPROFEN 600 MG PO TABS
600.0000 mg | ORAL_TABLET | Freq: Four times a day (QID) | ORAL | Status: DC
  Administered 2016-03-06 – 2016-03-07 (×7): 600 mg via ORAL
  Filled 2016-03-06 (×7): qty 1

## 2016-03-06 MED ORDER — ONDANSETRON HCL 4 MG PO TABS: 4.0000 mg | ORAL_TABLET | ORAL | Status: DC | PRN

## 2016-03-06 MED ORDER — WITCH HAZEL-GLYCERIN EX PADS: 1.0000 "application " | MEDICATED_PAD | CUTANEOUS | Status: DC | PRN

## 2016-03-06 MED ORDER — TETANUS-DIPHTH-ACELL PERTUSSIS 5-2.5-18.5 LF-MCG/0.5 IM SUSP: 0.5000 mL | Freq: Once | INTRAMUSCULAR | Status: DC

## 2016-03-06 MED ORDER — OXYCODONE-ACETAMINOPHEN 5-325 MG PO TABS
1.0000 | ORAL_TABLET | Freq: Four times a day (QID) | ORAL | Status: DC | PRN
  Administered 2016-03-06 (×4): 1 via ORAL
  Filled 2016-03-06 (×5): qty 1

## 2016-03-06 MED ORDER — ACETAMINOPHEN 325 MG PO TABS: 650.0000 mg | ORAL_TABLET | ORAL | Status: DC | PRN

## 2016-03-06 MED ORDER — SENNOSIDES-DOCUSATE SODIUM 8.6-50 MG PO TABS
2.0000 | ORAL_TABLET | ORAL | Status: DC
  Administered 2016-03-06: 2 via ORAL
  Filled 2016-03-06: qty 2

## 2016-03-06 MED ORDER — ONDANSETRON HCL 4 MG/2ML IJ SOLN: 4.0000 mg | INTRAMUSCULAR | Status: DC | PRN

## 2016-03-06 MED ORDER — LANOLIN HYDROUS EX OINT: TOPICAL_OINTMENT | CUTANEOUS | Status: DC | PRN

## 2016-03-06 MED ORDER — DIBUCAINE 1 % RE OINT: 1.0000 "application " | TOPICAL_OINTMENT | RECTAL | Status: DC | PRN

## 2016-03-06 MED ORDER — BENZOCAINE-MENTHOL 20-0.5 % EX AERO
1.0000 "application " | INHALATION_SPRAY | CUTANEOUS | Status: DC | PRN
  Filled 2016-03-06: qty 56

## 2016-03-06 MED ORDER — ZOLPIDEM TARTRATE 5 MG PO TABS: 5.0000 mg | ORAL_TABLET | Freq: Every evening | ORAL | Status: DC | PRN

## 2016-03-06 MED ORDER — DIPHENHYDRAMINE HCL 25 MG PO CAPS: 25.0000 mg | ORAL_CAPSULE | Freq: Four times a day (QID) | ORAL | Status: DC | PRN

## 2016-03-06 MED ORDER — OXYCODONE HCL 5 MG PO TABS: 10.0000 mg | ORAL_TABLET | ORAL | Status: DC | PRN

## 2016-03-06 MED ORDER — OXYCODONE HCL 5 MG PO TABS: 5.0000 mg | ORAL_TABLET | ORAL | Status: DC | PRN

## 2016-03-06 NOTE — Anesthesia Postprocedure Evaluation (Signed)
Anesthesia Post Note  Patient: Linda Khan  Procedure(s) Performed: * No procedures listed *  Patient location during evaluation: Mother Baby Anesthesia Type: Epidural Level of consciousness: awake and alert and oriented Pain management: satisfactory to patient Vital Signs Assessment: post-procedure vital signs reviewed and stable Respiratory status: spontaneous breathing and nonlabored ventilation Cardiovascular status: stable Postop Assessment: no headache, no backache, no signs of nausea or vomiting, adequate PO intake and patient able to bend at knees (patient up walking) Anesthetic complications: no    Last Vitals:  Filed Vitals:   03/06/16 0320 03/06/16 0801  BP: 116/76 125/60  Pulse: 67 62  Temp: 37.3 C 36.6 C  Resp: 18 18    Last Pain:  Filed Vitals:   03/06/16 0813  PainSc: 8                  Arrin Pintor

## 2016-03-06 NOTE — Lactation Note (Signed)
This note was copied from a baby's chart. Lactation Consultation Note   Mom "tried " breast feeding, and has decided to exclusively formula feed.   Patient Name: Linda Khan [id number]'s Date: 03/06/2016     Maternal Data Formula Feeding for Exclusion: Yes Reason for exclusion: Mother's choice to formula feed on admision  Feeding Feeding Type: Bottle Fed - Formula Nipple Type: Slow - flow  LATCH Score/Interventions                      Lactation Tools Discussed/Used     Consult Status Consult Status: Complete    Alfred LevinsLee, Chales Pelissier Anne 03/06/2016, 9:58 AM

## 2016-03-06 NOTE — Progress Notes (Signed)
UR chart review completed.  

## 2016-03-07 MED ORDER — IBUPROFEN 600 MG PO TABS: 600.0000 mg | ORAL_TABLET | Freq: Four times a day (QID) | ORAL | Status: DC
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
## 2016-03-07 NOTE — Progress Notes (Signed)
Post Partum Day 1 Subjective:  Xiamara Q Mickle MalloryDonnell is a 20 y.o. G1P1001 3757w2d s/p SVD.  No acute events overnight.  Pt denies problems with ambulating, voiding or po intake.  She denies nausea or vomiting.  Pain is well controlled.  She has had flatus. She has had bowel movement.  Lochia Small.  Plan for birth control is Depo-Provera.  Method of Feeding: Bottle  Objective: Blood pressure 104/54, pulse 66, temperature 98.6 F (37 C), temperature source Oral, resp. rate 18, height 5\' 3"  (1.6 m), weight 138 lb (62.596 kg), last menstrual period 04/11/2015, SpO2 100 %, unknown if currently breastfeeding.  Physical Exam:  General: alert, cooperative and no distress Lochia:normal flow Chest: CTAB Heart: RRR no m/r/g Abdomen: +BS, soft, nontender Uterine Fundus: firm DVT Evaluation: No evidence of DVT seen on physical exam. Extremities: trace edema   Recent Labs  03/04/16 2300 03/06/16 0516  HGB 11.7* 10.6*  HCT 33.8* 30.4*    Assessment/Plan:  ASSESSMENT: Donah Driveriara Q Durnell is a 20 y.o. G1P1001 4257w2d s/p SVD  Social Work consult, possible dc this afternoon vs tomorrow   LOS: 3 days   Delynn FlavinAshly Gottschalk, DO 03/07/2016, 7:02 AM   OB fellow attestation:  I have seen and examined this patient; I agree with above documentation in the resident's note.   Federico FlakeKimberly Niles Santhiago Collingsworth, MD 8:47 AM

## 2016-03-07 NOTE — Discharge Instructions (Signed)

## 2016-03-07 NOTE — Discharge Summary (Signed)
OB Discharge Summary  Patient Name: Linda Khan DOB: 07/31/1996 MRN: [id number]  Date of admission: 03/04/2016 Delivering MD: Raliegh IpGOTTSCHALK, ASHLY M   Date of discharge: 03/07/2016  Admitting diagnosis: 40 WKS, CTXS Intrauterine pregnancy: 5432w2d     Secondary diagnosis:Active Problems:   SVD (spontaneous vaginal delivery)  Additional problems: She had a postpartum temperature to 100.46F within 1 hour of delivery, but was otherwise afebrile    Discharge diagnosis: Term Pregnancy Delivered                                                                     Post partum procedures: none  Augmentation: AROM and Pitocin  Complications: None  Hospital course:  Onset of Labor With Vaginal Delivery     20 y.o. yo G1P1001 at 332w2d was admitted in Active Labor on 03/04/2016. Patient had an uncomplicated labor course as follows:  Membrane Rupture Time/Date: 1:26 PM ,03/05/2016   Intrapartum Procedures: Episiotomy: None [1]                                         Lacerations:  2nd degree [3]  Patient had a delivery of a Viable infant. 03/06/2016  Information for the patient's newborn:  Ouida SillsDonnell, Boy Mikaiya [[id number]][[id number]]  Delivery Method: Vaginal, Spontaneous Delivery (Filed from Delivery Summary)    Pateint had an uncomplicated postpartum course.  She is ambulating, tolerating a regular diet, passing flatus, and urinating well. Patient is discharged home in stable condition on 03/07/2016.    Physical exam  Filed Vitals:   03/06/16 0320 03/06/16 0801 03/06/16 1725 03/07/16 0626  BP: 116/76 125/60 104/60 104/54  Pulse: 67 62 77 66  Temp: 99.2 F (37.3 C) 97.9 F (36.6 C) 98 F (36.7 C) 98.6 F (37 C)  TempSrc: Oral Oral Oral Oral  Resp: 18 18 17 18   Height:      Weight:      SpO2: 96% 100%     General: alert, cooperative and no distress Lochia: appropriate Uterine Fundus: firm Incision: N/A DVT Evaluation: No evidence of DVT seen on physical exam. Labs: Lab Results   Component Value Date   WBC 9.9 03/06/2016   HGB 10.6* 03/06/2016   HCT 30.4* 03/06/2016   MCV 84.7 03/06/2016   PLT 128* 03/06/2016   CMP Latest Ref Rng 08/29/2013  Glucose 70 - 99 mg/dL 80  BUN 6 - 23 mg/dL 14  Creatinine 7.820.50 - 9.561.10 mg/dL 2.130.65  Sodium 086135 - 578145 mEq/L 140  Potassium 3.5 - 5.1 mEq/L 3.9  Chloride 96 - 112 mEq/L 103  CO2 19 - 32 mEq/L 21  Calcium 8.4 - 10.5 mg/dL 9.5  Total Protein 6.0 - 8.3 g/dL 7.9  Total Bilirubin 0.3 - 1.2 mg/dL 0.4  Alkaline Phos 39 - 117 U/L 45  AST 0 - 37 U/L 18  ALT 0 - 35 U/L 11    Discharge instruction: per After Visit Summary and "Baby and Me Booklet".  After Visit Meds:    Medication List    TAKE these medications        ibuprofen 600 MG tablet  Commonly known as:  ADVIL,MOTRIN  Take 1 tablet (600 mg total) by mouth every 6 (six) hours.        Diet: routine diet  Activity: Advance as tolerated. Pelvic rest for 6 weeks.   Outpatient follow up:6 weeks Follow up Appt:No future appointments. Follow up visit: No Follow-up on file.  Postpartum contraception: Depo Provera  Newborn Data: Live born female  Birth Weight: 7 lb 5.5 oz (3330 g) APGAR: 8, 8  Baby Feeding: Bottle and Breast Disposition:home with mother   03/07/2016 Hilton Sinclair, MD  CNM attestation:  I have seen and examined this patient; I agree with above documentation in the Resident's note.    Clemmons,Lori Grissett, CNM 9:40 AM

## 2016-08-29 IMAGING — US US MFM OB COMP +14 WKS
1 series · 14 of 28 positions shown · non-contrast
Comparison: none

[Series 1: us mfm ob comp +14 wks · 14 of 70 slices shown]
[im 3/70]
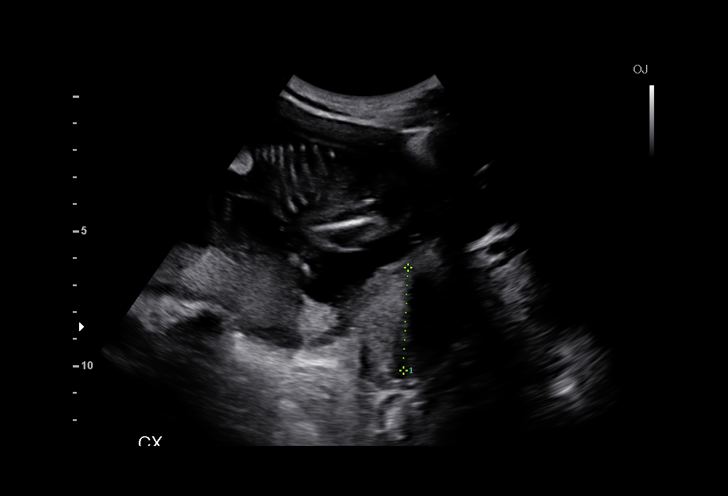
[im 8/70]
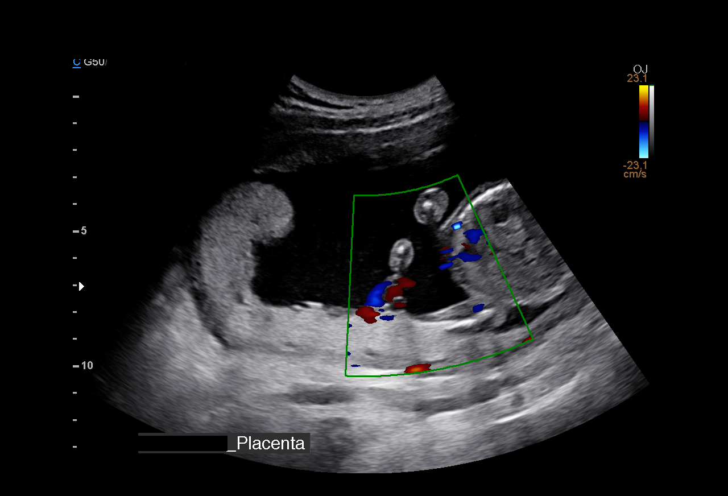
[im 13/70]
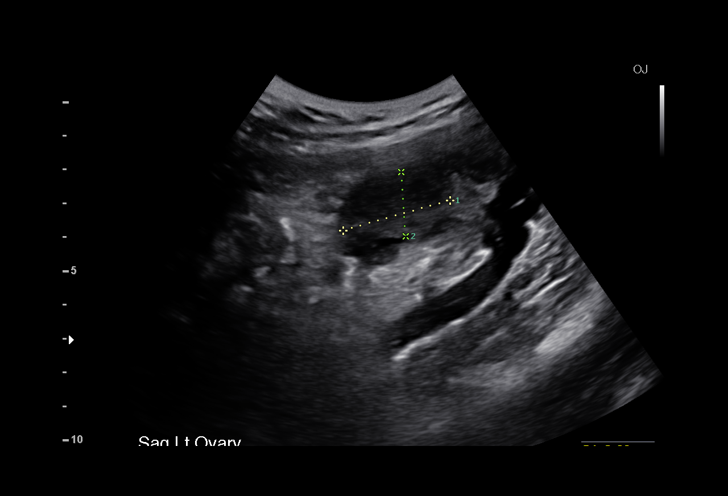
[im 18/70]
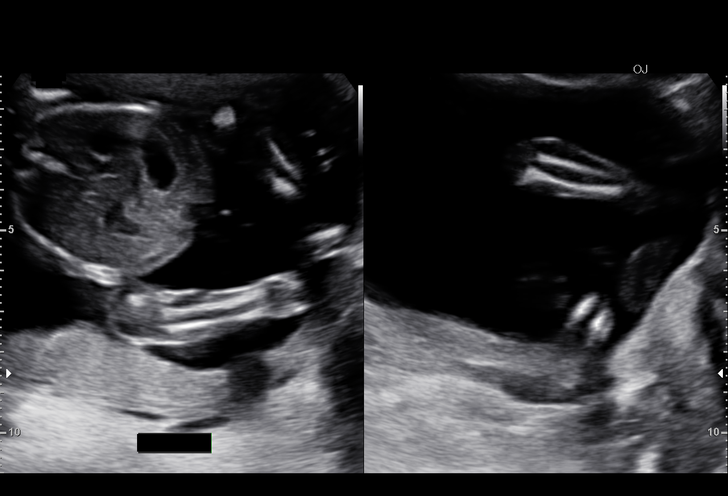
[im 24/70]
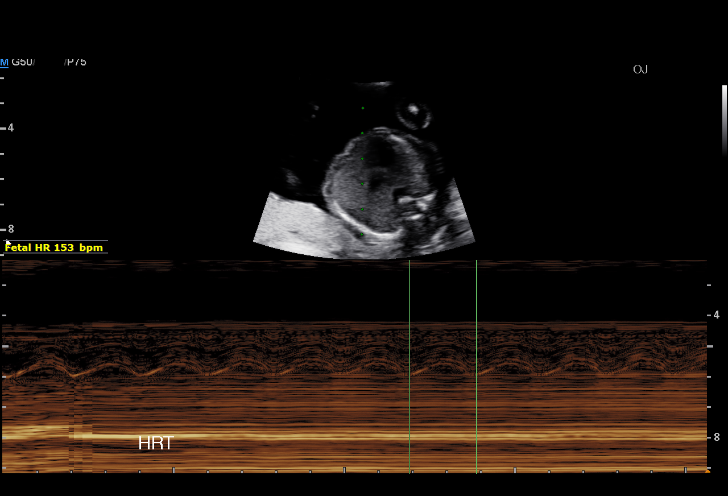
[im 29/70]
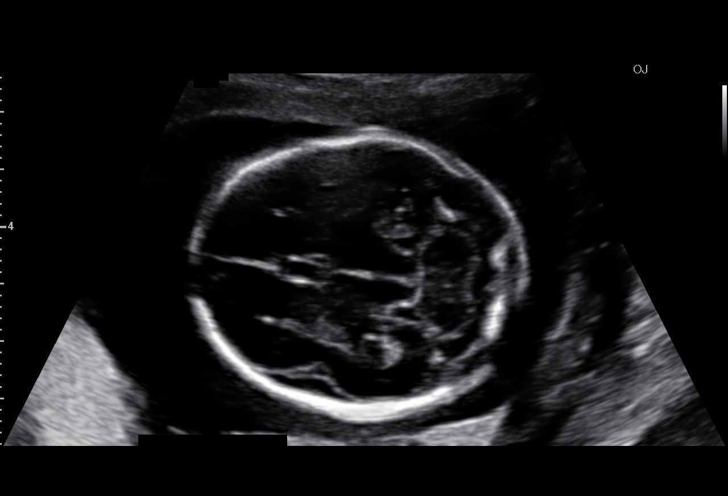
[im 34/70]
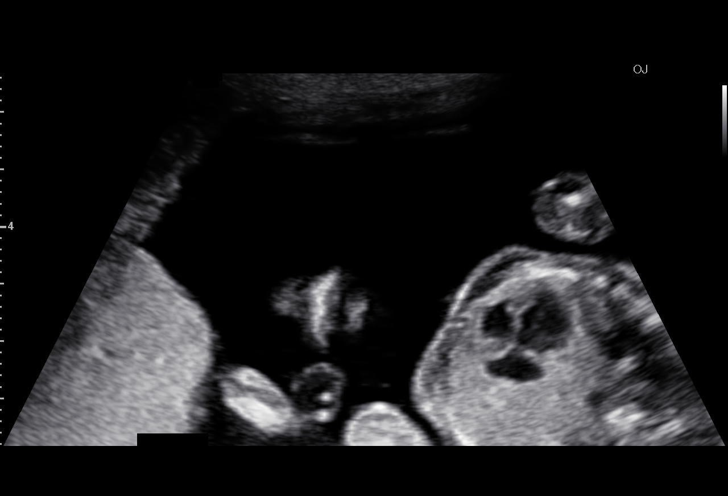
[im 39/70]
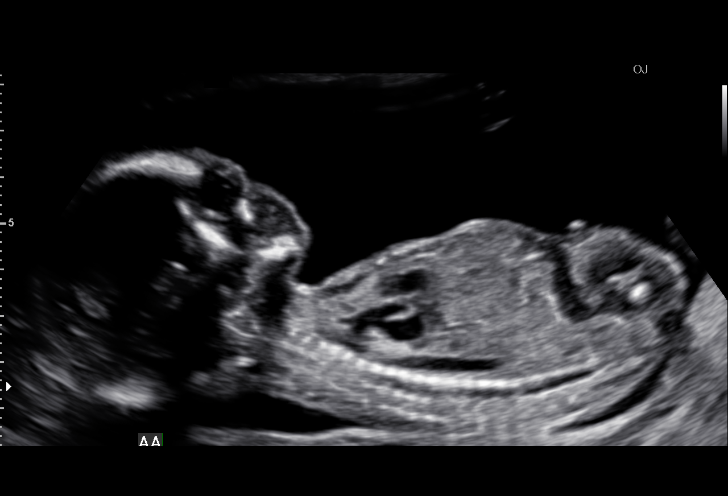
[im 44/70]
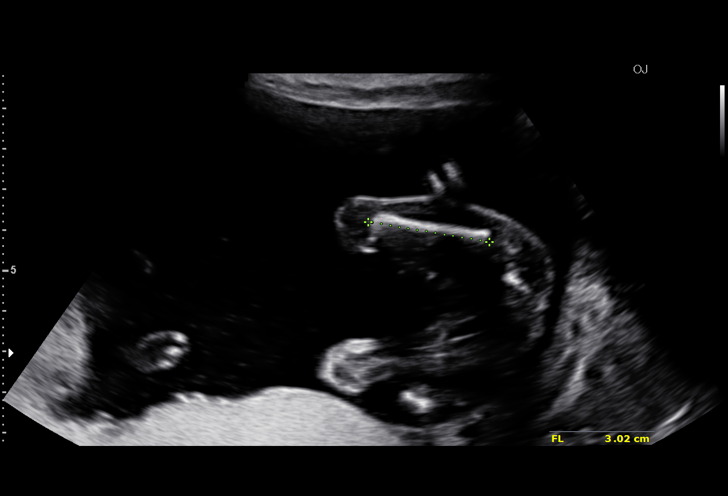
[im 49/70]
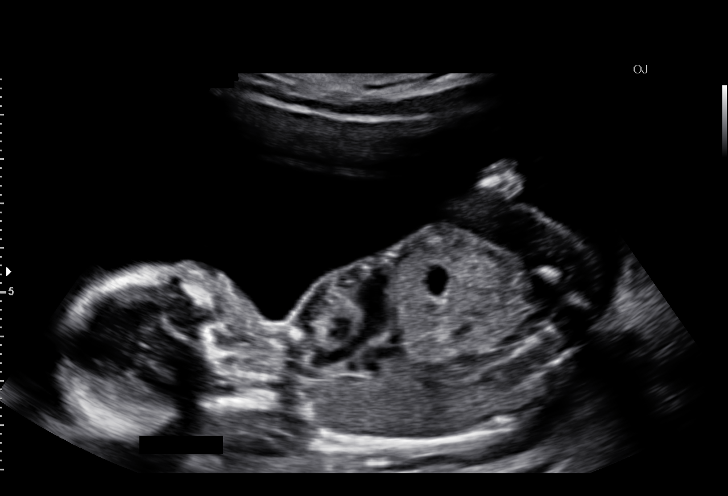
[im 54/70]
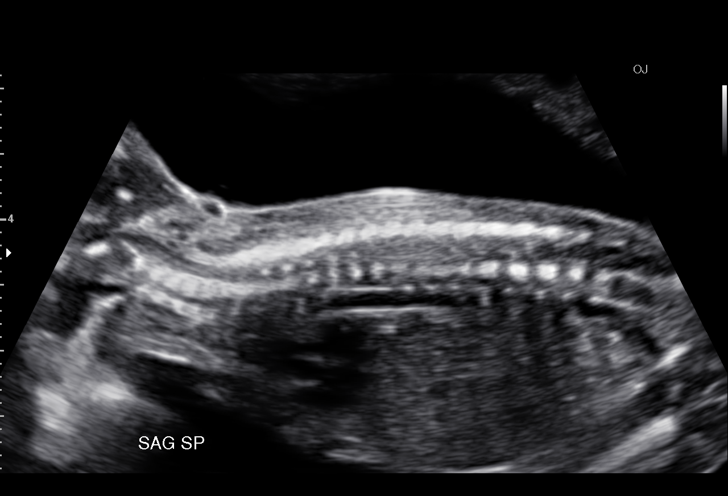
[im 59/70]
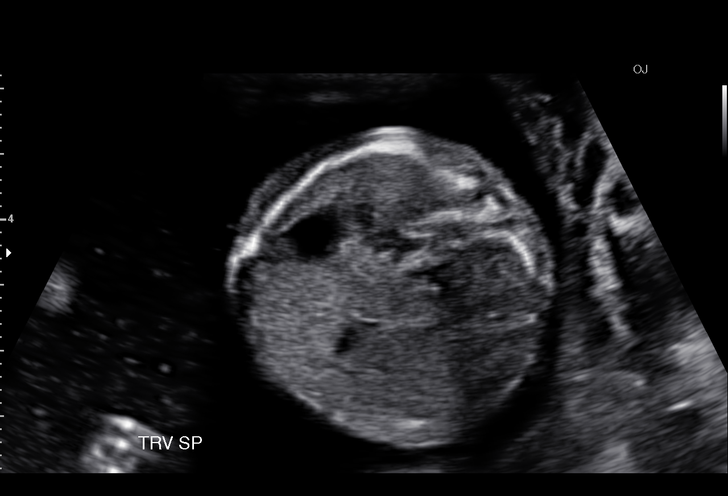
[im 64/70]
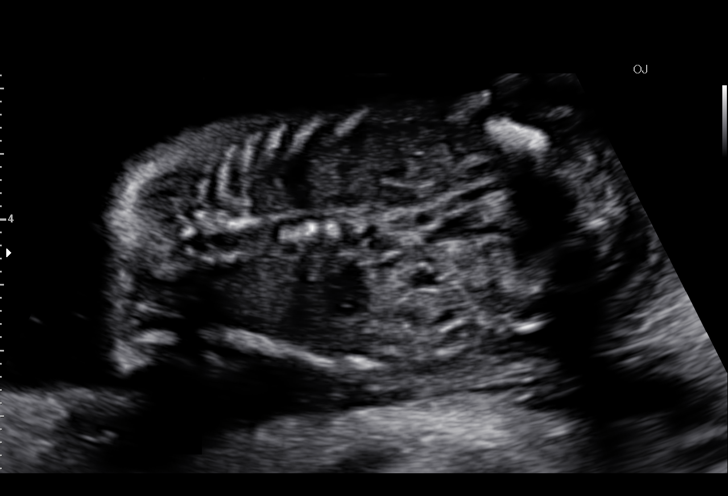
[im 70/70]
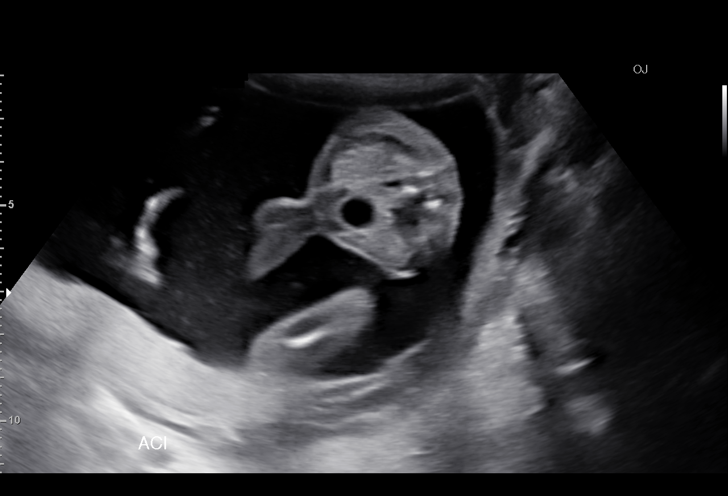

[14 of 28 positions shown; findings below may reference images not displayed]

OBSTETRICS REPORT
(Signed Final 10/12/2015 [DATE])

Name:       RTOYOTA JOSHJAX                       Visit  10/12/2015 [DATE]
Date:

By:
Service(s) Provided

Indications

19 weeks gestation of pregnancy
Basic anatomic survey                                  Z36
Smoking complicating pregnancy, second
trimester
Fetal Evaluation

Num Of             1
Fetuses:
Fetal Heart        153                          bpm
Rate:
Cardiac Activity:  Observed
Presentation:      Breech
Placenta:          Posterior, above cervical
os
P. Cord            Visualized
Insertion:

Amniotic Fluid
AFI FV:      Subjectively within normal limits
Larg Pckt:      5.6  cm
Biometry

BPD:     47.9   m    G. Age:   20w 3d                 CI:        77.61   70 - 86
m
FL/HC:      17.6   16.1 -
18.3
HC:     172.1   m    G. Age:   19w 6d        59  %    HC/AC:      1.12   1.09 -
m
AC:       154   m    G. Age:   20w 4d        80  %    FL/BPD
m                                     :
FL:      30.3   m    G. Age:   19w 3d        41  %    FL/AC:      19.7   20 - 24
m

Est.         327   gm   0 lb 12 oz      55   %
FW:
Gestational Age

U/S Today:     20w 1d                                         EDD:   02/28/16
Best:          19w 3d    Det. By:   Early Ultrasound          EDD:   03/04/16
(07/10/15)
Anatomy

Cranium:          Appears normal         Aortic Arch:       Appears normal
Fetal Cavum:      Appears normal         Ductal Arch:       Appears normal
Ventricles:       Appears normal         Diaphragm:         Appears normal
Choroid Plexus:   Appears normal         Stomach:           Appears normal,
left sided
Cerebellum:       Appears normal         Abdomen:           Appears normal
Posterior         Appears normal         Abdominal          Appears nml (cord
Fossa:                                   Wall:              insert, abd wall)
Nuchal Fold:      Appears normal         Cord Vessels:      Appears normal (3
vessel cord)
Face:             Appears normal         Kidneys:           Appear normal
(orbits and profile)
Lips:             Appears normal         Bladder:           Appears normal
Heart:            Appears normal         Spine:             Appears normal
(4CH, axis, and
situs)
RVOT:             Appears normal         Lower              Appears normal
Extremities:
LVOT:             Appears normal         Upper              Appears normal
Extremities:

Other:   Male gender. Heels visualized.
Cervix Uterus Adnexa

Cervical Length:    3.8       cm

Cervix:       Normal appearance by transabdominal scan.

Left Ovary:    Within normal limits.
Right Ovary:   Within normal limits.
Impression

SIUP at 19+3 weeks
Normal detailed fetal anatomy
Markers of aneuploidy: none
Normal amniotic fluid volume
Measurements consistent with early US
Recommendations

Follow-up as clinically indicated

## 2016-09-25 ENCOUNTER — Encounter (HOSPITAL_COMMUNITY): Payer: Self-pay | Admitting: *Deleted

## 2016-09-25 ENCOUNTER — Inpatient Hospital Stay (HOSPITAL_COMMUNITY)
Admission: AD | Admit: 2016-09-25 | Discharge: 2016-09-25 | Disposition: A | Discharge status: Home / Self Care | Payer: Medicaid Other | Source: Ambulatory Visit | Attending: Family Medicine | Admitting: Family Medicine

## 2016-09-25 DIAGNOSIS — Z3202 Encounter for pregnancy test, result negative: Secondary | ICD-10-CM | POA: Diagnosis not present

## 2016-09-25 DIAGNOSIS — Z5321 Procedure and treatment not carried out due to patient leaving prior to being seen by health care provider: Secondary | ICD-10-CM | POA: Insufficient documentation

## 2016-09-25 LAB — URINE MICROSCOPIC-ADD ON
Bacteria, UA: NONE SEEN
RBC / HPF: NONE SEEN RBC/hpf (ref 0–5)

## 2016-09-25 LAB — URINALYSIS, ROUTINE W REFLEX MICROSCOPIC
BILIRUBIN URINE: NEGATIVE
GLUCOSE, UA: NEGATIVE mg/dL
KETONES UR: NEGATIVE mg/dL
Nitrite: NEGATIVE
PH: 5.5 (ref 5.0–8.0)
Protein, ur: NEGATIVE mg/dL
Specific Gravity, Urine: >1.03 — ABNORMAL HIGH (ref 1.005–1.030)

## 2016-09-25 LAB — POCT PREGNANCY, URINE: Preg Test, Ur: NEGATIVE

## 2016-09-25 NOTE — Progress Notes (Signed)
Patient had to leave due to an emergency with her 457 month old. Was given pregnancy test results (negative). Signed AMA papers.

## 2016-09-25 NOTE — Progress Notes (Signed)
Pt said "i have an emergency with my son I need to go".  Had her sign the papers and she left before her assessment.

## 2016-09-25 NOTE — MAU Note (Signed)
Thinks she is preg, has not done home test. Been a month. Did have some pink d/c during the month. Pee a lot, not painful. Having some pain in upper abd, started today.

## 2016-10-23 ENCOUNTER — Emergency Department (HOSPITAL_BASED_OUTPATIENT_CLINIC_OR_DEPARTMENT_OTHER)
Admission: EM | Admit: 2016-10-23 | Discharge: 2016-10-24 | Disposition: A | Discharge status: Home / Self Care | Payer: Medicaid Other | Attending: Emergency Medicine | Admitting: Emergency Medicine

## 2016-10-23 ENCOUNTER — Encounter (HOSPITAL_BASED_OUTPATIENT_CLINIC_OR_DEPARTMENT_OTHER): Payer: Self-pay

## 2016-10-23 DIAGNOSIS — N390 Urinary tract infection, site not specified: Secondary | ICD-10-CM | POA: Diagnosis not present

## 2016-10-23 DIAGNOSIS — Z87891 Personal history of nicotine dependence: Secondary | ICD-10-CM | POA: Diagnosis not present

## 2016-10-23 DIAGNOSIS — N760 Acute vaginitis: Secondary | ICD-10-CM | POA: Diagnosis not present

## 2016-10-23 DIAGNOSIS — B9689 Other specified bacterial agents as the cause of diseases classified elsewhere: Secondary | ICD-10-CM

## 2016-10-23 DIAGNOSIS — N898 Other specified noninflammatory disorders of vagina: Secondary | ICD-10-CM | POA: Diagnosis present

## 2016-10-23 LAB — URINALYSIS, ROUTINE W REFLEX MICROSCOPIC
Bilirubin Urine: NEGATIVE
Glucose, UA: NEGATIVE mg/dL
Hgb urine dipstick: NEGATIVE
KETONES UR: NEGATIVE mg/dL
NITRITE: NEGATIVE
PH: 5.5 (ref 5.0–8.0)
Protein, ur: NEGATIVE mg/dL
SPECIFIC GRAVITY, URINE: 1.029 (ref 1.005–1.030)

## 2016-10-23 LAB — WET PREP, GENITAL
SPERM: NONE SEEN
Trich, Wet Prep: NONE SEEN
YEAST WET PREP: NONE SEEN

## 2016-10-23 LAB — URINE MICROSCOPIC-ADD ON

## 2016-10-23 LAB — PREGNANCY, URINE: Preg Test, Ur: NEGATIVE

## 2016-10-23 MED ORDER — CEFTRIAXONE SODIUM 250 MG IJ SOLR
250.0000 mg | Freq: Once | INTRAMUSCULAR | Status: AC
  Administered 2016-10-23: 250 mg via INTRAMUSCULAR
  Filled 2016-10-23: qty 250

## 2016-10-23 MED ORDER — AZITHROMYCIN 1 G PO PACK
1.0000 g | PACK | Freq: Once | ORAL | Status: AC
  Administered 2016-10-23: 1 g via ORAL
  Filled 2016-10-23: qty 1

## 2016-10-23 MED ORDER — LIDOCAINE HCL (PF) 1 % IJ SOLN
INTRAMUSCULAR | Status: AC
  Administered 2016-10-23: 1.2 mL
  Filled 2016-10-23: qty 5

## 2016-10-23 NOTE — ED Notes (Signed)
ED Provider at bedside. 

## 2016-10-23 NOTE — ED Provider Notes (Signed)
[facility]   CSN: [id number] Arrival date & time: 10/23/16  2057  By signing my name below, I, Linda Khan, attest that this documentation has been prepared under the direction and in the presence of Linda MondayErin Fard Borunda, MD . Electronically Signed: Teofilo PodMatthew P. Khan, ED Scribe. 10/23/2016. 10:42 PM.    History   Chief Complaint Chief Complaint  Patient presents with  . Vaginal Discharge    The history is provided by the patient. No language interpreter was used.   HPI Comments:  Linda Khan is a 20 y.o. female who presents to the Emergency Department complaining of intermittent vaginal discharge x 2 weeks. Pt states that the discharge is cloudy. LNMP was 10/07/16. Pt is sexually active with female partners and does not use condoms. Pt states that there may be a concern for an STD due to her partner having intercourse with someone else. No alleviating factors noted. Pt denies abdominal pain, nausea, vomiting, dysuria.   Past Medical History:  Diagnosis Date  . Bell's palsy   . Bell's palsy   . Bronchitis   . Chlamydia   . Gonorrhea   . Trichomonas vaginitis   . UTI (urinary tract infection)     Patient Active Problem List   Diagnosis Date Noted  . SVD (spontaneous vaginal delivery) 03/06/2016    Past Surgical History:  Procedure Laterality Date  . WISDOM TOOTH EXTRACTION      OB History    Gravida Para Term Preterm AB Living   1 1 1     1    SAB TAB Ectopic Multiple Live Births         0 1       Home Medications    Prior to Admission medications   Medication Sig Start Date End Date Taking? Authorizing Provider  metroNIDAZOLE (FLAGYL) 500 MG tablet Take 1 tablet (500 mg total) by mouth 2 (two) times daily. 10/24/16   Linda MondayErin Faydra Korman, MD    Family History Family History  Problem Relation Age of Onset  . Diabetes Father   . Diabetes Maternal Aunt   . Diabetes Maternal Grandmother   . Diabetes Paternal Grandmother      Social History Social History  Substance Use Topics  . Smoking status: Former Smoker    Packs/day: 0.25    Types: Cigarettes    Quit date: 05/30/2015  . Smokeless tobacco: Never Used  . Alcohol use Yes     Comment: occasionally     Allergies   Patient has no known allergies.   Review of Systems Review of Systems  Constitutional: Negative for fever.  Gastrointestinal: Negative for abdominal pain, nausea and vomiting.  Genitourinary: Positive for vaginal discharge. Negative for dysuria and flank pain.  Skin: Negative for rash.  All other systems reviewed and are negative.    Physical Exam Updated Vital Signs BP 108/62 (BP Location: Right Arm)   Pulse 76   Temp 97.8 F (36.6 C) (Oral)   Resp 16   Ht 5\' 4"  (1.626 m)   Wt 122 lb (55.3 kg)   LMP 10/07/2016   SpO2 99%   BMI 20.94 kg/m   Physical Exam  Constitutional: She appears well-developed and well-nourished. No distress.  HENT:  Head: Normocephalic and atraumatic.  Eyes: Conjunctivae are normal.  Neck: Neck supple.  Cardiovascular: Normal rate and regular rhythm.   No murmur heard. Pulmonary/Chest: Effort normal and breath sounds normal. No respiratory distress.  Abdominal: Soft. There is no tenderness.  Genitourinary: Uterus is not tender. Cervix exhibits discharge. Cervix exhibits no motion tenderness. Right adnexum displays no tenderness. Left adnexum displays no tenderness. Vaginal discharge found.  Musculoskeletal: She exhibits no edema.  Neurological: She is alert.  Skin: Skin is warm and dry.  Psychiatric: She has a normal mood and affect.  Nursing note and vitals reviewed.    ED Treatments / Results  DIAGNOSTIC STUDIES:  Oxygen Saturation is 100% on RA, normal by my interpretation.    COORDINATION OF CARE:  10:42 PM Discussed treatment plan with pt at bedside and pt agreed to plan.   Labs (all labs ordered are listed, but only abnormal results are displayed) Labs Reviewed  WET PREP,  GENITAL - Abnormal; Notable for the following:       Result Value   Clue Cells Wet Prep HPF POC PRESENT (*)    WBC, Wet Prep HPF POC MANY (*)    All other components within normal limits  URINALYSIS, ROUTINE W REFLEX MICROSCOPIC (NOT AT [facility]) - Abnormal; Notable for the following:    APPearance CLOUDY (*)    Leukocytes, UA MODERATE (*)    All other components within normal limits  URINE MICROSCOPIC-ADD ON - Abnormal; Notable for the following:    Squamous Epithelial / LPF 6-30 (*)    Bacteria, UA FEW (*)    All other components within normal limits  PREGNANCY, URINE  GC/CHLAMYDIA PROBE AMP ([place]) NOT AT [facility]    EKG  EKG Interpretation None       Radiology No results found.  Procedures Procedures (including critical care time)  Medications Ordered in ED Medications  cefTRIAXone (ROCEPHIN) injection 250 mg (250 mg Intramuscular Given 10/23/16 2307)  azithromycin (ZITHROMAX) powder 1 g (1 g Oral Given 10/23/16 2305)  lidocaine (PF) (XYLOCAINE) 1 % injection (1.2 mLs  Given 10/23/16 2307)  fosfomycin (MONUROL) packet 3 g (3 g Oral Given 10/24/16 0026)     Initial Impression / Assessment and Plan / ED Course  I have reviewed the triage vital signs and the nursing notes.  Pertinent labs & imaging results that were available during my care of the patient were reviewed by me and considered in my medical decision making (see chart for details).  Clinical Course    20yo female presents with concern for vaginal discharge.  Preg negative. Urinalysis questionable for UTI, will treat with 1 time dose of fosfomycin given no symptoms of pyelonephritis.  Empirically given rocephin/azithromycin with GC/Chl pending. Do not feel PID treatment indicated given no significant tenderness or abdominal pain. Wet prep shows bacterial vaginosis, pt given flagyl rx. Discussed condom use, need for treatment of partners of GC/Chltests come back positive.Patient discharged in stable condition  with understanding of reasons to return.   Final Clinical Impressions(s) / ED Diagnoses   Final diagnoses:  BV (bacterial vaginosis)  Urinary tract infection without hematuria, site unspecified    New Prescriptions Discharge Medication List as of 10/24/2016 12:20 AM    START taking these medications   Details  metroNIDAZOLE (FLAGYL) 500 MG tablet Take 1 tablet (500 mg total) by mouth 2 (two) times daily., Starting Tue 10/24/2016, Print      I personally performed the services described in this documentation, which was scribed in my presence. The recorded information has been reviewed and is accurate.     Linda MondayErin Joey Hudock, MD 10/24/16 1148

## 2016-10-23 NOTE — ED Triage Notes (Signed)
Vaginal d/c x 2 weeks-NAD-steady gait

## 2016-10-24 MED ORDER — METRONIDAZOLE 500 MG PO TABS: 500.0000 mg | ORAL_TABLET | Freq: Two times a day (BID) | ORAL | 0 refills | Status: DC

## 2016-10-24 MED ORDER — FOSFOMYCIN TROMETHAMINE 3 G PO PACK
3.0000 g | PACK | Freq: Once | ORAL | Status: AC
  Administered 2016-10-24: 3 g via ORAL
  Filled 2016-10-24: qty 3

## 2016-10-24 NOTE — ED Notes (Signed)
ED Provider at bedside discussing test results and dispo plan of care. 

## 2016-10-25 LAB — GC/CHLAMYDIA PROBE AMP (~~LOC~~) NOT AT ARMC
Chlamydia: POSITIVE — AB
NEISSERIA GONORRHEA: NEGATIVE

## 2016-11-27 NOTE — L&D Delivery Note (Signed)
Patient is 21 y.o. G2P1001 [redacted]w[redacted]d admitted for IOL 2/2 NRNST, non-compliant w/ PNC. S/p IOL with Pitocin.  Prenatal course also complicated by poor PNC, substance use.  Delivery Note At 7:13 PM a viable female was delivered via Vaginal, Spontaneous Delivery (Presentation: ROA).  APGAR: 9, 9; weight  pending.   Placenta status: Intact.  Cord: 3V with the following complications: none.  Cord pH: N/A  Anesthesia:  Epidural Episiotomy: None Lacerations:  None Est. Blood Loss (mL):  100  Mom to postpartum.  Baby to Couplet care / Skin to Skin.  Upon arrival patient was complete and pushing. She pushed with good maternal effort to deliver a viable infant. Nuchal cord present x1,easily reduced. Baby delivered without difficulty, was noted to have good tone and place on maternal abdomen for oral suctioning, drying and stimulation. Delayed cord clamping performed. Placenta delivered spontaneously with gentle cord traction. Fundus firm with massage and Pitocin. Perineum inspected and found to be hemostatic. Counts of sharps, instruments, and lap pads were all correct.  Caryl Ada, DO OB Fellow Faculty Practice, [facility] -  07/16/2017, 7:36 PM

## 2016-11-28 ENCOUNTER — Encounter: Payer: Self-pay | Admitting: *Deleted

## 2016-11-28 ENCOUNTER — Ambulatory Visit (INDEPENDENT_AMBULATORY_CARE_PROVIDER_SITE_OTHER): Payer: Medicaid Other | Admitting: *Deleted

## 2016-11-28 DIAGNOSIS — Z3201 Encounter for pregnancy test, result positive: Secondary | ICD-10-CM | POA: Diagnosis not present

## 2016-11-28 DIAGNOSIS — Z3200 Encounter for pregnancy test, result unknown: Secondary | ICD-10-CM

## 2016-11-28 LAB — POCT PREGNANCY, URINE: PREG TEST UR: POSITIVE — AB

## 2016-11-28 NOTE — Progress Notes (Signed)
Here for pregnancy test which was positive. States has regular periods and LMP was 11/11/7 which makes her 7wk3d with EDD 07/14/17. Reviewed meds with her - taking none . Plans to go to Health department for prenatal care- given letter of proof of pregnancy.

## 2016-12-13 ENCOUNTER — Emergency Department (HOSPITAL_COMMUNITY): Payer: Medicaid Other

## 2016-12-13 ENCOUNTER — Emergency Department (HOSPITAL_COMMUNITY)
Admission: EM | Admit: 2016-12-13 | Discharge: 2016-12-14 | Disposition: A | Discharge status: Home / Self Care | Payer: Medicaid Other | Attending: Emergency Medicine | Admitting: Emergency Medicine

## 2016-12-13 ENCOUNTER — Encounter (HOSPITAL_COMMUNITY): Payer: Self-pay

## 2016-12-13 DIAGNOSIS — W010XXA Fall on same level from slipping, tripping and stumbling without subsequent striking against object, initial encounter: Secondary | ICD-10-CM | POA: Insufficient documentation

## 2016-12-13 DIAGNOSIS — O23591 Infection of other part of genital tract in pregnancy, first trimester: Secondary | ICD-10-CM | POA: Insufficient documentation

## 2016-12-13 DIAGNOSIS — O26891 Other specified pregnancy related conditions, first trimester: Secondary | ICD-10-CM

## 2016-12-13 DIAGNOSIS — Y92002 Bathroom of unspecified non-institutional (private) residence single-family (private) house as the place of occurrence of the external cause: Secondary | ICD-10-CM | POA: Diagnosis not present

## 2016-12-13 DIAGNOSIS — Y939 Activity, unspecified: Secondary | ICD-10-CM | POA: Diagnosis not present

## 2016-12-13 DIAGNOSIS — Y999 Unspecified external cause status: Secondary | ICD-10-CM | POA: Diagnosis not present

## 2016-12-13 DIAGNOSIS — W19XXXA Unspecified fall, initial encounter: Secondary | ICD-10-CM

## 2016-12-13 DIAGNOSIS — R109 Unspecified abdominal pain: Secondary | ICD-10-CM

## 2016-12-13 DIAGNOSIS — Z3A09 9 weeks gestation of pregnancy: Secondary | ICD-10-CM | POA: Insufficient documentation

## 2016-12-13 DIAGNOSIS — Z87891 Personal history of nicotine dependence: Secondary | ICD-10-CM | POA: Diagnosis not present

## 2016-12-13 DIAGNOSIS — A5901 Trichomonal vulvovaginitis: Secondary | ICD-10-CM

## 2016-12-13 DIAGNOSIS — O0281 Inappropriate change in quantitative human chorionic gonadotropin (hCG) in early pregnancy: Secondary | ICD-10-CM | POA: Diagnosis not present

## 2016-12-13 LAB — ABO/RH: ABO/RH(D): A POS

## 2016-12-13 LAB — WET PREP, GENITAL
CLUE CELLS WET PREP: NONE SEEN
Sperm: NONE SEEN
Yeast Wet Prep HPF POC: NONE SEEN

## 2016-12-13 LAB — I-STAT CHEM 8, ED
BUN: 6 mg/dL (ref 6–20)
CHLORIDE: 105 mmol/L (ref 101–111)
Calcium, Ion: 1.2 mmol/L (ref 1.15–1.40)
Creatinine, Ser: 0.4 mg/dL — ABNORMAL LOW (ref 0.44–1.00)
Glucose, Bld: 84 mg/dL (ref 65–99)
HEMATOCRIT: 38 % (ref 36.0–46.0)
HEMOGLOBIN: 12.9 g/dL (ref 12.0–15.0)
POTASSIUM: 3.8 mmol/L (ref 3.5–5.1)
Sodium: 139 mmol/L (ref 135–145)
TCO2: 23 mmol/L (ref 0–100)

## 2016-12-13 LAB — TYPE AND SCREEN
ABO/RH(D): A POS
ANTIBODY SCREEN: NEGATIVE

## 2016-12-13 LAB — OB RESULTS CONSOLE ABO/RH: RH TYPE: POSITIVE

## 2016-12-13 LAB — HCG, QUANTITATIVE, PREGNANCY: HCG, BETA CHAIN, QUANT, S: 170567 m[IU]/mL — AB (ref <5)

## 2016-12-13 LAB — OB RESULTS CONSOLE GC/CHLAMYDIA: GC PROBE AMP, GENITAL: NEGATIVE

## 2016-12-13 MED ORDER — ACETAMINOPHEN 325 MG PO TABS: 650.0000 mg | ORAL_TABLET | Freq: Four times a day (QID) | ORAL | 0 refills | Status: DC | PRN

## 2016-12-13 MED ORDER — METRONIDAZOLE 500 MG PO TABS: 500.0000 mg | ORAL_TABLET | Freq: Two times a day (BID) | ORAL | 0 refills | Status: DC

## 2016-12-13 MED ORDER — ACETAMINOPHEN 325 MG PO TABS
650.0000 mg | ORAL_TABLET | Freq: Once | ORAL | Status: AC
  Administered 2016-12-13: 650 mg via ORAL
  Filled 2016-12-13: qty 2

## 2016-12-13 NOTE — Discharge Instructions (Signed)
Your ultrasound shows a subchorionic hematoma. This is often a normal finding in early pregnancy and should resolve as the pregnancy continues. We advise that you start pelvic rest which includes no strenuous activity, heavy lifting, or sexual activity. Your testing today also was positive for Trichomonas. This is a sexually transmitted infection. Your gonorrhea and chlamydia cultures are pending. Take Flagyl as prescribed until finished for treatment of Trichomonas. Follow-up with health department in 2 days regarding the results of your pending STD tests. We recommend that you schedule an appointment with a OB/GYN for prenatal care. [facility] for any concerning symptoms related to your pregnancy.

## 2016-12-13 NOTE — ED Provider Notes (Signed)
[facility]-EMERGENCY DEPT Provider Note   CSN: [id number] Arrival date & time: 12/13/16  2103    History   Chief Complaint Chief Complaint  Patient presents with  . Fall  . Abdominal Pain    HPI Linda Khan is a 21 y.o. female.  21 y/o G2P1 female, currently approximately [redacted] weeks pregnant, presents to the emergency department for evaluation of abdominal pain. She states that she fell forward in her bathroom at 1400. She states that primary impact was to her knees. She states that her anterior abdomen did touch the toilet, but she denies significant blunt abdominal injury. She states that she began to experience cramping and aching, generally, to her abdomen. She has had some faint spotting. No passage of clots or tissue. No associated loss of consciousness. No nausea or vomiting. No medications taken prior to arrival for pain. Patient has yet to follow-up with an OB/GYN for prenatal care.   The history is provided by the patient. No language interpreter was used.  Fall  Associated symptoms include abdominal pain.  Abdominal Pain      Past Medical History:  Diagnosis Date  . Bell's palsy   . Bell's palsy   . Bronchitis   . Chlamydia   . Gonorrhea   . Trichomonas vaginitis   . UTI (urinary tract infection)     Patient Active Problem List   Diagnosis Date Noted  . SVD (spontaneous vaginal delivery) 03/06/2016    Past Surgical History:  Procedure Laterality Date  . WISDOM TOOTH EXTRACTION      OB History    Gravida Para Term Preterm AB Living   2 1 1     1    SAB TAB Ectopic Multiple Live Births         0 1       Home Medications    Prior to Admission medications   Medication Sig Start Date End Date Taking? Authorizing Provider  acetaminophen (TYLENOL) 325 MG tablet Take 2 tablets (650 mg total) by mouth every 6 (six) hours as needed for moderate pain. 12/13/16   Antony MaduraKelly Iasha Mccalister, PA-C  metroNIDAZOLE (FLAGYL) 500 MG tablet Take 1 tablet (500 mg total) by mouth 2  (two) times daily. 12/13/16   Antony MaduraKelly Wessley Emert, PA-C    Family History Family History  Problem Relation Age of Onset  . Diabetes Father   . Diabetes Maternal Aunt   . Diabetes Maternal Grandmother   . Diabetes Paternal Grandmother     Social History Social History  Substance Use Topics  . Smoking status: Former Smoker    Packs/day: 0.25    Types: Cigarettes    Quit date: 05/30/2015  . Smokeless tobacco: Never Used  . Alcohol use Yes     Comment: occasionally     Allergies   Patient has no known allergies.   Review of Systems Review of Systems  Gastrointestinal: Positive for abdominal pain.  Ten systems reviewed and are negative for acute change, except as noted in the HPI.    Physical Exam Updated Vital Signs BP 116/74 (BP Location: Right Arm)   Pulse 61   Temp 97.5 F (36.4 C) (Oral)   Resp 18   Ht 5\' 3"  (1.6 m)   Wt 54.4 kg   LMP 10/07/2016   SpO2 100%   BMI 21.26 kg/m   Physical Exam  Constitutional: She is oriented to person, place, and time. She appears well-developed and well-nourished. No distress.  Nontoxic and in NAD  HENT:  Head:  Normocephalic and atraumatic.  Eyes: Conjunctivae and EOM are normal. No scleral icterus.  Neck: Normal range of motion.  Cardiovascular: Normal rate, regular rhythm and intact distal pulses.   Pulmonary/Chest: Effort normal. No respiratory distress. She has no wheezes. She has no rales.  Respirations even and unlabored  Abdominal: Soft. She exhibits no distension and no mass. There is no guarding.  No focal TTP. No distension. No peritoneal signs.  Genitourinary: There is no rash, tenderness or lesion on the right labia. There is no rash, tenderness or lesion on the left labia. Uterus is not tender. Cervix exhibits no motion tenderness and no friability. Right adnexum displays no mass and no tenderness. Left adnexum displays no mass and no tenderness. No bleeding in the vagina. Vaginal discharge (scant, white) found.    Genitourinary Comments: No blood in vaginal vault. Cervical os closed. No TTP.  Musculoskeletal: Normal range of motion.  Neurological: She is alert and oriented to person, place, and time. She exhibits normal muscle tone. Coordination normal.  Skin: Skin is warm and dry. No rash noted. She is not diaphoretic. No erythema. No pallor.  Psychiatric: She has a normal mood and affect. Her behavior is normal.  Nursing note and vitals reviewed.    ED Treatments / Results  Labs (all labs ordered are listed, but only abnormal results are displayed) Labs Reviewed  WET PREP, GENITAL - Abnormal; Notable for the following:       Result Value   Trich, Wet Prep PRESENT (*)    WBC, Wet Prep HPF POC MANY (*)    All other components within normal limits  HCG, QUANTITATIVE, PREGNANCY - Abnormal; Notable for the following:    hCG, Beta Chain, Quant, S 170,567 (*)    All other components within normal limits  I-STAT CHEM 8, ED - Abnormal; Notable for the following:    Creatinine, Ser 0.40 (*)    All other components within normal limits  TYPE AND SCREEN  ABO/RH  GC/CHLAMYDIA PROBE AMP ([place]) NOT AT [facility]    EKG  EKG Interpretation None       Radiology US Ob Comp Less 14 Wks  Result Date: 12/13/2016 CLINICAL DATA:  21 y/o F; sharp abdominal pain and a scant spotting. EXAM: OBSTETRIC <14 WK ULTRASOUND TECHNIQUE: Transabdominal ultrasound was performed for evaluation of the gestation as well as the maternal uterus and adnexal regions. COMPARISON:  10/12/2015 pelvic ultrasound. FINDINGS: Intrauterine gestational sac: Single Yolk sac:  Visualized. Embryo:  Visualized. Cardiac Activity: Visualized. Heart Rate: 171 bpm CRL:   22  mm   8 w 6 d                  [place] EDC: 07/19/2017 Subchorionic hemorrhage: Small subchorionic hemorrhage measuring 2.7 x 1.6 x 1.7 cm along the lateral aspect of the gestational sac. Maternal uterus/adnexae: Normal. IMPRESSION: Single live intrauterine pregnancy with  estimated gestational age of [redacted] weeks and 6 days. Small subchorionic hemorrhage. Electronically Signed   By: Mitzi Hansen M.D.   On: 12/13/2016 23:09    Procedures Procedures (including critical care time)  Medications Ordered in ED Medications  acetaminophen (TYLENOL) tablet 650 mg (not administered)     Initial Impression / Assessment and Plan / ED Course  I have reviewed the triage vital signs and the nursing notes.  Pertinent labs & imaging results that were available during my care of the patient were reviewed by me and considered in my medical decision making (see chart for  details).  Clinical Course     21 year old female presents to the emergency department from home after a fall with complaints of abdominal pain and vaginal spotting. No evidence of blood in the vaginal vault. Cervical os closed on exam. Ultrasound shows a small subchorionic hematoma. This is often a normal finding in early pregnancy. There is a single live gestation of 8 weeks 6 days. Workup did also reveal incidental finding of trichomonal vaginitis. Gonorrhea and chlamydia cultures are pending. Given risk of miscarriage with untreated trichomonas, will initiate treatment with Flagyl. Tylenol advised for pain/cramping. Patient to follow-up in 2 days on the results of her STD tests. She has been placed on pelvic rest. OB/GYN follow-up advised for prenatal care. Return precautions discussed and provided. Patient discharged in stable condition with no unaddressed concerns.   Final Clinical Impressions(s) / ED Diagnoses   Final diagnoses:  Abdominal pain in pregnancy, first trimester  Trichomonas vaginitis  Fall, initial encounter    New Prescriptions New Prescriptions   METRONIDAZOLE (FLAGYL) 500 MG TABLET    Take 1 tablet (500 mg total) by mouth 2 (two) times daily.     Antony Madura, PA-C 12/13/16 [id number]    Alvira Monday, MD 12/14/16 [phone]

## 2016-12-13 NOTE — ED Triage Notes (Signed)
PT RECEIVED FROM HOME VIA EMS C/O A FALL AND LOWER ABDOMINAL PAIN SINCE 2 PM. PER EMS,  PT SLIP AND FELL COMING OUT OF THE SHOWER, POSSIBLY HITTING HER STOMACH. DENIES LOC. PT STS SHE IS APPROX [redacted] WEEKS PREGNANT, AND SHE STARTED HAVING THE SHARP LOWER ABDOMINAL CRAMPING, CHEST AND BACK PAIN, AND SPOTTING FOLLOWING THE FALL. G2 P1.

## 2016-12-13 NOTE — ED Notes (Signed)
Bed: [id number] Expected date:  Expected time:  Means of arrival:  Comments: 21 yo F/ ABD pain

## 2016-12-14 LAB — GC/CHLAMYDIA PROBE AMP (~~LOC~~) NOT AT ARMC
Chlamydia: POSITIVE — AB
Neisseria Gonorrhea: NEGATIVE

## 2016-12-14 NOTE — ED Notes (Signed)
[facility] called per pt.'s request.

## 2016-12-14 NOTE — ED Notes (Signed)
[facility] declined request for transport, pt. [facility] Pt. Ambulatory.

## 2016-12-20 ENCOUNTER — Encounter (HOSPITAL_COMMUNITY): Payer: Self-pay | Admitting: *Deleted

## 2016-12-20 ENCOUNTER — Emergency Department (HOSPITAL_COMMUNITY)
Admission: EM | Admit: 2016-12-20 | Discharge: 2016-12-20 | Disposition: A | Discharge status: Home / Self Care | Payer: Medicaid Other | Attending: Emergency Medicine | Admitting: Emergency Medicine

## 2016-12-20 DIAGNOSIS — O98311 Other infections with a predominantly sexual mode of transmission complicating pregnancy, first trimester: Secondary | ICD-10-CM | POA: Diagnosis not present

## 2016-12-20 DIAGNOSIS — A749 Chlamydial infection, unspecified: Secondary | ICD-10-CM | POA: Insufficient documentation

## 2016-12-20 DIAGNOSIS — Z3A10 10 weeks gestation of pregnancy: Secondary | ICD-10-CM | POA: Insufficient documentation

## 2016-12-20 DIAGNOSIS — Z87891 Personal history of nicotine dependence: Secondary | ICD-10-CM | POA: Insufficient documentation

## 2016-12-20 DIAGNOSIS — Z202 Contact with and (suspected) exposure to infections with a predominantly sexual mode of transmission: Secondary | ICD-10-CM

## 2016-12-20 MED ORDER — AZITHROMYCIN 250 MG PO TABS
1000.0000 mg | ORAL_TABLET | Freq: Once | ORAL | Status: AC
  Administered 2016-12-20: 1000 mg via ORAL
  Filled 2016-12-20: qty 4

## 2016-12-20 NOTE — ED Triage Notes (Signed)
Pt states that she was exposed to clamydia. Pt states that she is [redacted] weeks pregnant as well. Denies any symptoms.

## 2016-12-20 NOTE — ED Provider Notes (Signed)
[facility]PT Provider Note   CSN: [id number] Arrival date & time: 12/20/16  1832     History   Chief Complaint Chief Complaint  Patient presents with  . SEXUALLY TRANSMITTED DISEASE    HPI Linda Khan is a 21 y.o. female.  HPI  21 year old female at [redacted] weeks gestational age who presents with STD exposure. Patient seen in the [facility] ED on January 17 for testing, and had STD testing performed. She was positive for chlamydia when she checked her MyChart today.She was also treated for Trichomonas and finished treatment. No additional vaginal discharge or vaginal bleeding. No significant abdominal pain, fever, nausea or vomiting. She has not been sexually active since diagnosis for Trichomonas last week.  Past Medical History:  Diagnosis Date  . Bell's palsy   . Bell's palsy   . Bronchitis   . Chlamydia   . Gonorrhea   . Trichomonas vaginitis   . UTI (urinary tract infection)     Patient Active Problem List   Diagnosis Date Noted  . SVD (spontaneous vaginal delivery) 03/06/2016    Past Surgical History:  Procedure Laterality Date  . WISDOM TOOTH EXTRACTION      OB History    Gravida Para Term Preterm AB Living   2 1 1     1    SAB TAB Ectopic Multiple Live Births         0 1       Home Medications    Prior to Admission medications   Medication Sig Start Date End Date Taking? Authorizing Provider  acetaminophen (TYLENOL) 325 MG tablet Take 2 tablets (650 mg total) by mouth every 6 (six) hours as needed for moderate pain. 12/13/16   Antony Madura, PA-C  metroNIDAZOLE (FLAGYL) 500 MG tablet Take 1 tablet (500 mg total) by mouth 2 (two) times daily. 12/13/16   Antony Madura, PA-C    Family History Family History  Problem Relation Age of Onset  . Diabetes Father   . Diabetes Maternal Aunt   . Diabetes Maternal Grandmother   . Diabetes Paternal Grandmother     Social History Social History  Substance Use Topics  . Smoking status: Former Smoker   Packs/day: 0.25    Types: Cigarettes    Quit date: 05/30/2015  . Smokeless tobacco: Never Used  . Alcohol use Yes     Comment: occasionally     Allergies   Patient has no known allergies.   Review of Systems Review of Systems 10/14 systems reviewed and are negative other than those stated in the HPI   Physical Exam Updated Vital Signs BP 124/63 (BP Location: Right Arm)   Pulse 78   Temp 98.2 F (36.8 C) (Oral)   Resp 18   Ht 5\' 3"  (1.6 m)   Wt 115 lb (52.2 kg)   LMP 10/07/2016   SpO2 100%   BMI 20.37 kg/m   Physical Exam Physical Exam  Nursing note and vitals reviewed. Constitutional: Well developed, well nourished, non-toxic, and in no acute distress Head: Normocephalic and atraumatic.  Mouth/Throat: Oropharynx is clear and moist.  Neck: Normal range of motion. Neck supple.  Cardiovascular: Normal rate and regular rhythm.   Pulmonary/Chest: Effort normal and breath sounds normal.  Abdominal: Soft. There is no tenderness. There is no rebound and no guarding.  Musculoskeletal: Normal range of motion.  Neurological: Alert, no facial droop, fluent speech, moves all extremities symmetrically Skin: Skin is warm and dry.  Psychiatric: Cooperative   ED Treatments /  Results  Labs (all labs ordered are listed, but only abnormal results are displayed) Labs Reviewed - No data to display  EKG  EKG Interpretation None       Radiology No results found.  Procedures Procedures (including critical care time)  Medications Ordered in ED Medications  azithromycin (ZITHROMAX) tablet 1,000 mg (not administered)     Initial Impression / Assessment and Plan / ED Course  I have reviewed the triage vital signs and the nursing notes.  Pertinent labs & imaging results that were available during my care of the patient were reviewed by me and considered in my medical decision making (see chart for details).     Presenting for positive chlamydia test on screening exam  1/17. No signs of systemic illness. No signs of PID. Given 1g of azithromycin. She was negative for gonorrhea and does not need co-treatment. Discussed retesting in 2-3 weeks to make sure infection cleared. She has follow-up with her OB by then for that. Discussed strict return instructions.  She expressed understanding of all discharge instructions and felt comfortable with the plan of care.   Final Clinical Impressions(s) / ED Diagnoses   Final diagnoses:  STD exposure  Chlamydia infection    New Prescriptions New Prescriptions   No medications on file     Lavera Guiseana Duo Erskine Steinfeldt, MD 12/20/16 2042

## 2016-12-20 NOTE — Discharge Instructions (Signed)
Please follow-up with your OB physician for re-check in 2-3 weeks.  Return without fail for worsening symptoms, including fever, severe abdominal pain, intractable vomiting or any other symptoms concerning to you.

## 2016-12-22 ENCOUNTER — Other Ambulatory Visit (HOSPITAL_COMMUNITY): Payer: Self-pay | Admitting: Nurse Practitioner

## 2016-12-22 DIAGNOSIS — Z3A12 12 weeks gestation of pregnancy: Secondary | ICD-10-CM

## 2016-12-22 DIAGNOSIS — Z3682 Encounter for antenatal screening for nuchal translucency: Secondary | ICD-10-CM

## 2016-12-22 LAB — CULTURE, OB URINE: URINE CULTURE, OB: NEGATIVE

## 2016-12-22 LAB — OB RESULTS CONSOLE RPR: RPR: NONREACTIVE

## 2016-12-22 LAB — OB RESULTS CONSOLE HEPATITIS B SURFACE ANTIGEN: HEP B S AG: NEGATIVE

## 2016-12-22 LAB — OB RESULTS CONSOLE RUBELLA ANTIBODY, IGM: Rubella: IMMUNE

## 2016-12-22 LAB — OB RESULTS CONSOLE PLATELET COUNT: Platelets: 189

## 2016-12-22 LAB — OB RESULTS CONSOLE HIV ANTIBODY (ROUTINE TESTING): HIV: NONREACTIVE

## 2016-12-22 LAB — CYSTIC FIBROSIS DIAGNOSTIC STUDY: Interpretation-CFDNA:: NEGATIVE

## 2017-01-05 ENCOUNTER — Encounter (HOSPITAL_COMMUNITY): Payer: Self-pay | Admitting: *Deleted

## 2017-01-08 ENCOUNTER — Ambulatory Visit (HOSPITAL_COMMUNITY)
Admission: RE | Admit: 2017-01-08 | Discharge: 2017-01-08 | Disposition: A | Discharge status: Home / Self Care | Payer: Self-pay | Source: Ambulatory Visit | Attending: Nurse Practitioner | Admitting: Nurse Practitioner

## 2017-01-08 ENCOUNTER — Encounter (HOSPITAL_COMMUNITY): Payer: Self-pay

## 2017-01-08 DIAGNOSIS — Z3A12 12 weeks gestation of pregnancy: Secondary | ICD-10-CM | POA: Insufficient documentation

## 2017-01-08 DIAGNOSIS — Z3682 Encounter for antenatal screening for nuchal translucency: Secondary | ICD-10-CM | POA: Insufficient documentation

## 2017-01-10 ENCOUNTER — Other Ambulatory Visit: Payer: Self-pay

## 2017-01-15 ENCOUNTER — Inpatient Hospital Stay (HOSPITAL_COMMUNITY)
Admission: AD | Admit: 2017-01-15 | Discharge: 2017-01-15 | Disposition: A | Discharge status: Home / Self Care | Payer: Medicaid Other | Source: Ambulatory Visit | Attending: Obstetrics & Gynecology | Admitting: Obstetrics & Gynecology

## 2017-01-15 ENCOUNTER — Encounter (HOSPITAL_COMMUNITY): Payer: Self-pay

## 2017-01-15 DIAGNOSIS — O99351 Diseases of the nervous system complicating pregnancy, first trimester: Secondary | ICD-10-CM | POA: Insufficient documentation

## 2017-01-15 DIAGNOSIS — Z3A13 13 weeks gestation of pregnancy: Secondary | ICD-10-CM | POA: Diagnosis not present

## 2017-01-15 DIAGNOSIS — G43009 Migraine without aura, not intractable, without status migrainosus: Secondary | ICD-10-CM | POA: Insufficient documentation

## 2017-01-15 DIAGNOSIS — Z87891 Personal history of nicotine dependence: Secondary | ICD-10-CM | POA: Insufficient documentation

## 2017-01-15 DIAGNOSIS — Z8669 Personal history of other diseases of the nervous system and sense organs: Secondary | ICD-10-CM | POA: Insufficient documentation

## 2017-01-15 LAB — URINALYSIS, ROUTINE W REFLEX MICROSCOPIC
Bilirubin Urine: NEGATIVE
GLUCOSE, UA: NEGATIVE mg/dL
HGB URINE DIPSTICK: NEGATIVE
KETONES UR: NEGATIVE mg/dL
NITRITE: NEGATIVE
PROTEIN: 30 mg/dL — AB
Specific Gravity, Urine: 1.027 (ref 1.005–1.030)
pH: 5 (ref 5.0–8.0)

## 2017-01-15 MED ORDER — LACTATED RINGERS IV BOLUS (SEPSIS)
500.0000 mL | Freq: Once | INTRAVENOUS | Status: AC
  Administered 2017-01-15: 500 mL via INTRAVENOUS

## 2017-01-15 MED ORDER — METOCLOPRAMIDE HCL 5 MG/ML IJ SOLN
10.0000 mg | Freq: Once | INTRAMUSCULAR | Status: AC
  Administered 2017-01-15: 10 mg via INTRAVENOUS
  Filled 2017-01-15: qty 2

## 2017-01-15 MED ORDER — DIPHENHYDRAMINE HCL 50 MG/ML IJ SOLN
12.5000 mg | Freq: Once | INTRAMUSCULAR | Status: AC
  Administered 2017-01-15: 12.5 mg via INTRAVENOUS
  Filled 2017-01-15: qty 1

## 2017-01-15 MED ORDER — METOCLOPRAMIDE HCL 10 MG PO TABS: 10.0000 mg | ORAL_TABLET | Freq: Four times a day (QID) | ORAL | 0 refills | Status: DC

## 2017-01-15 MED ORDER — BUTALBITAL-APAP-CAFFEINE 50-325-40 MG PO TABS
2.0000 | ORAL_TABLET | Freq: Once | ORAL | Status: AC
  Administered 2017-01-15: 2 via ORAL
  Filled 2017-01-15: qty 2

## 2017-01-15 MED ORDER — DEXAMETHASONE SODIUM PHOSPHATE 10 MG/ML IJ SOLN
10.0000 mg | Freq: Once | INTRAMUSCULAR | Status: AC
Start: 2017-01-15 — End: 2017-01-15
  Administered 2017-01-15: 10 mg via INTRAVENOUS
  Filled 2017-01-15: qty 1

## 2017-01-15 NOTE — Discharge Instructions (Signed)

## 2017-01-15 NOTE — MAU Note (Signed)
Pt presents to MAU with complaints of headache for two weeks, pt states she can't sleep and is under a lot of stress. Denies any vaginal bleeding or abnormal discharge

## 2017-01-15 NOTE — MAU Provider Note (Signed)
History     CSN: [id number]  Arrival date and time: 01/15/17 1709   First Provider Initiated Contact with Patient 01/15/17 1752      Chief Complaint  Patient presents with  . 13 weeks, headache   HPI   Linda Khan is a G2P1001 @ [redacted]w[redacted]d female who presents to the MAU complaining of a constant headache. The headache is in the front of her head and radiates to the back of her head. The headache is constant. She describes the headache as a tightness. The headache started 2 weeks ago. She tried taking tylenol which did not help. She has not been taking it consistently.   OB History    Gravida Para Term Preterm AB Living   2 1 1     1    SAB TAB Ectopic Multiple Live Births         0 1      Past Medical History:  Diagnosis Date  . Bell's palsy   . Bell's palsy   . Bronchitis   . Chlamydia   . Gonorrhea   . Trichomonas vaginitis   . UTI (urinary tract infection)     Past Surgical History:  Procedure Laterality Date  . WISDOM TOOTH EXTRACTION      Family History  Problem Relation Age of Onset  . Diabetes Father   . Diabetes Maternal Aunt   . Diabetes Maternal Grandmother   . Diabetes Paternal Grandmother     Social History  Substance Use Topics  . Smoking status: Former Smoker    Packs/day: 0.25    Types: Cigarettes    Quit date: 11/28/2016  . Smokeless tobacco: Never Used  . Alcohol use Yes     Comment: occasionally    Allergies: No Known Allergies  Prescriptions Prior to Admission  Medication Sig Dispense Refill Last Dose  . acetaminophen (TYLENOL) 325 MG tablet Take 2 tablets (650 mg total) by mouth every 6 (six) hours as needed for moderate pain. 30 tablet 0 01/15/2017 at Unknown time  . Prenatal Vit-Fe Fumarate-FA (MULTIVITAMIN-PRENATAL) 27-0.8 MG TABS tablet Take 1 tablet by mouth daily at 12 noon.   01/15/2017 at Unknown time  . metroNIDAZOLE (FLAGYL) 500 MG tablet Take 1 tablet (500 mg total) by mouth 2 (two) times daily. (Patient not taking: Reported on  01/15/2017) 14 tablet 0 Not Taking at Unknown time   Results for orders placed or performed during the hospital encounter of 01/15/17 (from the past 48 hour(s))  Urinalysis, Routine w reflex microscopic     Status: Abnormal   Collection Time: 01/15/17  5:22 PM  Result Value Ref Range   Color, Urine YELLOW YELLOW   APPearance HAZY (A) CLEAR   Specific Gravity, Urine 1.027 1.005 - 1.030   pH 5.0 5.0 - 8.0   Glucose, UA NEGATIVE NEGATIVE mg/dL   Hgb urine dipstick NEGATIVE NEGATIVE   Bilirubin Urine NEGATIVE NEGATIVE   Ketones, ur NEGATIVE NEGATIVE mg/dL   Protein, ur 30 (A) NEGATIVE mg/dL   Nitrite NEGATIVE NEGATIVE   Leukocytes, UA MODERATE (A) NEGATIVE   RBC / HPF 0-5 0 - 5 RBC/hpf   WBC, UA 6-30 0 - 5 WBC/hpf   Bacteria, UA RARE (A) NONE SEEN   Squamous Epithelial / LPF 6-30 (A) NONE SEEN   Mucous PRESENT    Review of Systems  Eyes: Positive for photophobia.  Gastrointestinal: Negative for diarrhea and vomiting.   Physical Exam   Blood pressure (!) 112/49, pulse 81, temperature 99.1 F (37.3  C), resp. rate 18, height 5\' 5"  (1.651 m), weight 118 lb (53.5 kg), last menstrual period 10/07/2016, unknown if currently breastfeeding.  Physical Exam  Constitutional: She is oriented to person, place, and time. She appears well-developed and well-nourished. No distress.  HENT:  Head: Normocephalic.  Eyes: Pupils are equal, round, and reactive to light.  Musculoskeletal: Normal range of motion.  Neurological: She is alert and oriented to person, place, and time. She has normal strength. GCS eye subscore is 4. GCS verbal subscore is 5. GCS motor subscore is 6.  Skin: Skin is warm. She is not diaphoretic.  Psychiatric: Her behavior is normal.   MAU Course  Procedures  None  MDM  Urine culture  + fetal heart tones via doppler  Fioricet 2 tab: patient is without relief.  HA cocktail> Reglan, Decadron, Benadryl  Patient eating [employer] in the room.  Pain 0/10 at the time of  discharge. Patient is sleeping in the bed. Her ride is on the way.   Assessment and Plan   A:  1. Migraine without aura and without status migrainosus, not intractable     P:  Discharge home in stable condition Rx: Reglan Ok to continue tylenol as directed on the bottle Return to MAU if symptoms worsen Small, frequent meals Increase po fluid intake Follow up with OB.  Duane LopeJennifer I Rasch, NP 01/15/2017 9:04 PM

## 2017-01-15 NOTE — MAU Provider Note (Signed)
[name] is a 13week [id number] 21 year old female who presents to the MAU complaining of a constant headache. She describes the headache as a tightness. She also reports feeling lightheaded and blurry vision/ " seeing stars." These symptoms started around the same time two weeks ago and the headache has gotten progressively worse. The patient took 2 tablets of tylenol, which have not helped her headache. She denies nausea, vomiting, diarrhea and discharge. She admits to constipation, which started at the beginning of her pregnancy. Her previous pregnancy was uncomplicated and she denies a history of HTN and gestational diabetes. She experienced bleeding at 6 weeks during this pregnancy, she was told it was a hematoma.

## 2017-01-15 NOTE — Progress Notes (Addendum)
Assumed care of pt at this time. Awaiting provider to assess pt.   1925: provider at bs. Ordered for ha cocktail.  011: IV started with 500 bolus Lr. Warm blanket given. Lights turned off.   2045: states ha gone. Pt resting with eyes closed  2055: Discharge orders received.  IV dicontinued.  2100: Discharge instructions provided with pt understanding.  Pt left unit via ambulatory with family friend.

## 2017-01-15 NOTE — MAU Note (Signed)
Pt complains of constant headache for about 2 weeks with no relief from tylenol. No nausea vomiting.

## 2017-04-12 ENCOUNTER — Inpatient Hospital Stay (HOSPITAL_COMMUNITY)
Admission: AD | Admit: 2017-04-12 | Discharge: 2017-04-12 | Disposition: A | Discharge status: Home / Self Care | Payer: Medicaid Other | Source: Ambulatory Visit | Attending: Obstetrics and Gynecology | Admitting: Obstetrics and Gynecology

## 2017-04-12 DIAGNOSIS — O9989 Other specified diseases and conditions complicating pregnancy, childbirth and the puerperium: Secondary | ICD-10-CM

## 2017-04-12 DIAGNOSIS — Z9181 History of falling: Secondary | ICD-10-CM | POA: Diagnosis not present

## 2017-04-12 DIAGNOSIS — M549 Dorsalgia, unspecified: Secondary | ICD-10-CM | POA: Diagnosis not present

## 2017-04-12 DIAGNOSIS — Z3490 Encounter for supervision of normal pregnancy, unspecified, unspecified trimester: Secondary | ICD-10-CM

## 2017-04-12 DIAGNOSIS — Z331 Pregnant state, incidental: Secondary | ICD-10-CM

## 2017-04-12 DIAGNOSIS — R109 Unspecified abdominal pain: Secondary | ICD-10-CM | POA: Diagnosis not present

## 2017-04-12 DIAGNOSIS — R300 Dysuria: Secondary | ICD-10-CM | POA: Insufficient documentation

## 2017-04-12 DIAGNOSIS — Z3A26 26 weeks gestation of pregnancy: Secondary | ICD-10-CM | POA: Diagnosis not present

## 2017-04-12 DIAGNOSIS — O26892 Other specified pregnancy related conditions, second trimester: Secondary | ICD-10-CM | POA: Insufficient documentation

## 2017-04-12 DIAGNOSIS — M791 Myalgia, unspecified site: Secondary | ICD-10-CM

## 2017-04-12 LAB — URINALYSIS, ROUTINE W REFLEX MICROSCOPIC
Bilirubin Urine: NEGATIVE
GLUCOSE, UA: NEGATIVE mg/dL
HGB URINE DIPSTICK: NEGATIVE
Ketones, ur: NEGATIVE mg/dL
NITRITE: NEGATIVE
PH: 6 (ref 5.0–8.0)
Protein, ur: NEGATIVE mg/dL
Specific Gravity, Urine: 1.023 (ref 1.005–1.030)

## 2017-04-12 MED ORDER — NALBUPHINE HCL 10 MG/ML IJ SOLN
10.0000 mg | Freq: Once | INTRAMUSCULAR | Status: AC
  Administered 2017-04-12: 10 mg via INTRAVENOUS
  Filled 2017-04-12: qty 1

## 2017-04-12 MED ORDER — CYCLOBENZAPRINE HCL 10 MG PO TABS
10.0000 mg | ORAL_TABLET | Freq: Once | ORAL | Status: AC
Start: 2017-04-12 — End: 2017-04-12
  Administered 2017-04-12: 10 mg via ORAL
  Filled 2017-04-12: qty 1

## 2017-04-12 MED ORDER — ACETAMINOPHEN-CODEINE #3 300-30 MG PO TABS: 1.0000 | ORAL_TABLET | Freq: Four times a day (QID) | ORAL | 0 refills | Status: DC | PRN

## 2017-04-12 MED ORDER — TERBUTALINE SULFATE 1 MG/ML IJ SOLN
0.2500 mg | Freq: Once | INTRAMUSCULAR | Status: AC
  Administered 2017-04-12: 0.25 mg via SUBCUTANEOUS
  Filled 2017-04-12: qty 1

## 2017-04-12 MED ORDER — CYCLOBENZAPRINE HCL 10 MG PO TABS: 10.0000 mg | ORAL_TABLET | Freq: Three times a day (TID) | ORAL | 2 refills | Status: DC | PRN

## 2017-04-12 MED ORDER — LACTATED RINGERS IV BOLUS (SEPSIS)
1000.0000 mL | Freq: Once | INTRAVENOUS | Status: AC
  Administered 2017-04-12: 1000 mL via INTRAVENOUS

## 2017-04-12 MED ORDER — LACTATED RINGERS IV SOLN
INTRAVENOUS | Status: DC
  Administered 2017-04-12: 17:00:00 via INTRAVENOUS

## 2017-04-12 NOTE — MAU Provider Note (Signed)
  History     CSN: [id number]  Arrival date and time: 04/12/17 1608   First Provider Initiated Contact with Patient 04/12/17 1630      Chief Complaint  Patient presents with  . Fall   HPI  21 yo G2P1001 IUP 26 weeks presents to MAU via EMS d/t to back pain, vaginal pressure and dysuria since a fall yesterday. Pt reports that she fell down about five steps on her buttocks yesterday evening @ 5. She has noted the baby balling up since, back and butt pain and dysuria. She denies any vaginal bleeding or LOF. She reports good fetal movement. Prenatal care has been unremarkable thus far[facility]    Past Medical History:  Diagnosis Date  . Bell's palsy   . Bell's palsy   . Bronchitis   . Chlamydia   . Gonorrhea   . Trichomonas vaginitis   . UTI (urinary tract infection)     Past Surgical History:  Procedure Laterality Date  . WISDOM TOOTH EXTRACTION      Family History  Problem Relation Age of Onset  . Diabetes Father   . Diabetes Maternal Aunt   . Diabetes Maternal Grandmother   . Diabetes Paternal Grandmother     Social History  Substance Use Topics  . Smoking status: Former Smoker    Packs/day: 0.25    Types: Cigarettes    Quit date: 11/28/2016  . Smokeless tobacco: Never Used  . Alcohol use Yes     Comment: occasionally    Allergies: No Known Allergies  Prescriptions Prior to Admission  Medication Sig Dispense Refill Last Dose  . acetaminophen (TYLENOL) 325 MG tablet Take 2 tablets (650 mg total) by mouth every 6 (six) hours as needed for moderate pain. 30 tablet 0 01/15/2017 at Unknown time  . metoCLOPramide (REGLAN) 10 MG tablet Take 1 tablet (10 mg total) by mouth every 6 (six) hours. 30 tablet 0   . Prenatal Vit-Fe Fumarate-FA (MULTIVITAMIN-PRENATAL) 27-0.8 MG TABS tablet Take 1 tablet by mouth daily at 12 noon.   01/15/2017 at Unknown time    Review of Systems Physical Exam   Blood pressure 106/70, pulse 80, temperature 98.3 F (36.8  C), temperature source Oral, resp. rate 18, last menstrual period 10/07/2016, unknown if currently breastfeeding.  Physical Exam  Constitutional: She appears well-developed and well-nourished.  Cardiovascular: Normal rate and regular rhythm.   Respiratory: Effort normal and breath sounds normal.  GI: Soft. Bowel sounds are normal.  Gravid, non tender  Genitourinary:  Genitourinary Comments: cervix LTC  Musculoskeletal:  Buttock tenderness, no bruising noted    MAU Course  Procedures Pt was given IV fluids, pain medication and SQ terb. Ut ctx resolved and back pain improved. Ambualted to restroom without problems Reactive NST UA a few bacteria thus UC was ordered     Assessment and Plan  IUP 26 weeks S/P fall (over 24 hrs ago) Musculoskeletal pain secondary to above Dysuria  As noted above. Felt amendable to d/c home. Flexeril and T#3 (12). Instructed to alternate between heat and ice. PTL precautions and keep routine OB appt with [facility]. Hermina StaggersMichael L Taariq Leitz 04/12/2017, 4:38 PM

## 2017-04-12 NOTE — Progress Notes (Signed)
Assumed care of pt from [employer] [facility]. Orders received from MD to start IV and give 1L bolus. And next LR @ 125.  1717: terb, flexeril, and nubain given per MD orders  1720: MD at bs discussing POC with pt.   1845: Discharge instructions given with pt understanding. Pt left unit with SO via ambulatory.

## 2017-04-12 NOTE — MAU Note (Signed)
Pt fell down 4-5 steps yesterday hit her back and buttocks did not hit her stomach. C/ back and hip pain today. C/O baby "balling up and cramping a s well. Denies any vaginal bleeding. Good fetal movement felt.

## 2017-04-12 NOTE — Discharge Instructions (Signed)
Musculoskeletal Pain Musculoskeletal pain is muscle and bone aches and pains. This pain can occur in any part of the body. Follow these instructions at home:  Only take medicines for pain, discomfort, or fever as told by your health care provider.  You may continue all activities unless the activities cause more pain. When the pain lessens, slowly resume normal activities. Gradually increase the intensity and duration of the activities or exercise.  During periods of severe pain, bed rest may be helpful. Lie or sit in any position that is comfortable, but get out of bed and walk around at least every several hours.  If directed, put ice on the injured area.  Put ice in a plastic bag.  Place a towel between your skin and the bag.  Leave the ice on for 20 minutes, 2-3 times a day. Contact a health care provider if:  Your pain is getting worse.  Your pain is not relieved with medicines.  You lose function in the area of the pain if the pain is in your arms, legs, or neck. This information is not intended to replace advice given to you by your health care provider. Make sure you discuss any questions you have with your health care provider. Document Released: 11/13/2005 Document Revised: 04/25/2016 Document Reviewed: 07/18/2013 Elsevier Interactive Patient Education  2017 [employer] Inc[employer] of the uterus can occur throughout pregnancy, but they are not always a sign that you are in labor. You may have practice contractions called Braxton Hicks contractions. These false labor contractions are sometimes confused with true labor. What are Deberah Pelton contractions? Braxton Hicks contractions are tightening movements that occur in the muscles of the uterus before labor. Unlike true labor contractions, these contractions do not result in opening (dilation) and thinning of the cervix. Toward the end of pregnancy (32-34 weeks), Braxton Hicks contractions can  happen more often and may become stronger. These contractions are sometimes difficult to tell apart from true labor because they can be very uncomfortable. You should not feel embarrassed if you go to the hospital with false labor. Sometimes, the only way to tell if you are in true labor is for your health care provider to look for changes in the cervix. The health care provider will do a physical exam and may monitor your contractions. If you are not in true labor, the exam should show that your cervix is not dilating and your water has not broken. If there are no prenatal problems or other health problems associated with your pregnancy, it is completely safe for you to be sent home with false labor. You may continue to have Braxton Hicks contractions until you go into true labor. How can I tell the difference between true labor and false labor?  Differences  False labor  Contractions last 30-70 seconds.: Contractions are usually shorter and not as strong as true labor contractions.  Contractions become very regular.: Contractions are usually irregular.  Discomfort is usually felt in the top of the uterus, and it spreads to the lower abdomen and low back.: Contractions are often felt in the front of the lower abdomen and in the groin.  Contractions do not go away with walking.: Contractions may go away when you walk around or change positions while lying down.  Contractions usually become more intense and increase in frequency.: Contractions get weaker and are shorter-lasting as time goes on.  The cervix dilates and gets thinner.: The cervix usually does not dilate or become thin.  Follow these instructions at home:  Take over-the-counter and prescription medicines only as told by your health care provider.  Keep up with your usual exercises and follow other instructions from your health care provider.  Eat and drink lightly if you think you are going into labor.  If Braxton Hicks  contractions are making you uncomfortable:  Change your position from lying down or resting to walking, or change from walking to resting.  Sit and rest in a tub of warm water.  Drink enough fluid to keep your urine clear or pale yellow. Dehydration may cause these contractions.  Do slow and deep breathing several times an hour.  Keep all follow-up prenatal visits as told by your health care provider. This is important. Contact a health care provider if:  You have a fever.  You have continuous pain in your abdomen. Get help right away if:  Your contractions become stronger, more regular, and closer together.  You have fluid leaking or gushing from your vagina.  You pass blood-tinged mucus (bloody show).  You have bleeding from your vagina.  You have low back pain that you never had before.  You feel your babys head pushing down and causing pelvic pressure.  Your baby is not moving inside you as much as it used to. Summary  Contractions that occur before labor are called Braxton Hicks contractions, false labor, or practice contractions.  Braxton Hicks contractions are usually shorter, weaker, farther apart, and less regular than true labor contractions. True labor contractions usually become progressively stronger and regular and they become more frequent.  Manage discomfort from [facility] contractions by changing position, resting in a warm bath, drinking plenty of water, or practicing deep breathing. This information is not intended to replace advice given to you by your health care provider. Make sure you discuss any questions you have with your health care provider. Document Released: 11/13/2005 Document Revised: 10/02/2016 Document Reviewed: 10/02/2016 Elsevier Interactive Patient Education  2017 [employer]

## 2017-04-13 LAB — URINE CULTURE: SPECIAL REQUESTS: NORMAL

## 2017-04-20 ENCOUNTER — Encounter (HOSPITAL_COMMUNITY): Payer: Self-pay | Admitting: Emergency Medicine

## 2017-04-20 ENCOUNTER — Observation Stay (HOSPITAL_COMMUNITY): Payer: Medicaid Other

## 2017-04-20 ENCOUNTER — Observation Stay (HOSPITAL_COMMUNITY)
Admission: EM | Admit: 2017-04-20 | Discharge: 2017-04-21 | Disposition: A | Discharge status: Home / Self Care | Payer: Medicaid Other | Attending: Obstetrics & Gynecology | Admitting: Obstetrics & Gynecology

## 2017-04-20 ENCOUNTER — Emergency Department (HOSPITAL_COMMUNITY): Payer: Medicaid Other

## 2017-04-20 DIAGNOSIS — S79911A Unspecified injury of right hip, initial encounter: Secondary | ICD-10-CM | POA: Diagnosis not present

## 2017-04-20 DIAGNOSIS — O99322 Drug use complicating pregnancy, second trimester: Secondary | ICD-10-CM | POA: Insufficient documentation

## 2017-04-20 DIAGNOSIS — F1910 Other psychoactive substance abuse, uncomplicated: Secondary | ICD-10-CM | POA: Diagnosis not present

## 2017-04-20 DIAGNOSIS — Z3A27 27 weeks gestation of pregnancy: Secondary | ICD-10-CM | POA: Insufficient documentation

## 2017-04-20 DIAGNOSIS — O26893 Other specified pregnancy related conditions, third trimester: Secondary | ICD-10-CM | POA: Diagnosis not present

## 2017-04-20 DIAGNOSIS — F17210 Nicotine dependence, cigarettes, uncomplicated: Secondary | ICD-10-CM | POA: Diagnosis not present

## 2017-04-20 DIAGNOSIS — S199XXA Unspecified injury of neck, initial encounter: Secondary | ICD-10-CM | POA: Insufficient documentation

## 2017-04-20 DIAGNOSIS — S3991XA Unspecified injury of abdomen, initial encounter: Secondary | ICD-10-CM

## 2017-04-20 DIAGNOSIS — Y9389 Activity, other specified: Secondary | ICD-10-CM | POA: Diagnosis not present

## 2017-04-20 DIAGNOSIS — O99332 Smoking (tobacco) complicating pregnancy, second trimester: Secondary | ICD-10-CM | POA: Diagnosis not present

## 2017-04-20 DIAGNOSIS — O9A212 Injury, poisoning and certain other consequences of external causes complicating pregnancy, second trimester: Principal | ICD-10-CM | POA: Insufficient documentation

## 2017-04-20 DIAGNOSIS — Y92008 Other place in unspecified non-institutional (private) residence as the place of occurrence of the external cause: Secondary | ICD-10-CM | POA: Insufficient documentation

## 2017-04-20 DIAGNOSIS — R109 Unspecified abdominal pain: Secondary | ICD-10-CM

## 2017-04-20 DIAGNOSIS — T71193A Asphyxiation due to mechanical threat to breathing due to other causes, assault, initial encounter: Secondary | ICD-10-CM | POA: Insufficient documentation

## 2017-04-20 DIAGNOSIS — S3981XA Other specified injuries of abdomen, initial encounter: Secondary | ICD-10-CM | POA: Diagnosis not present

## 2017-04-20 DIAGNOSIS — Y998 Other external cause status: Secondary | ICD-10-CM | POA: Diagnosis not present

## 2017-04-20 DIAGNOSIS — Z3493 Encounter for supervision of normal pregnancy, unspecified, third trimester: Secondary | ICD-10-CM

## 2017-04-20 LAB — CBC
HCT: 29.1 % — ABNORMAL LOW (ref 36.0–46.0)
Hemoglobin: 10.2 g/dL — ABNORMAL LOW (ref 12.0–15.0)
MCH: 31.2 pg (ref 26.0–34.0)
MCHC: 35.1 g/dL (ref 30.0–36.0)
MCV: 89 fL (ref 78.0–100.0)
PLATELETS: 158 10*3/uL (ref 150–400)
RBC: 3.27 MIL/uL — AB (ref 3.87–5.11)
RDW: 13.6 % (ref 11.5–15.5)
WBC: 9 10*3/uL (ref 4.0–10.5)

## 2017-04-20 LAB — URINALYSIS, ROUTINE W REFLEX MICROSCOPIC
Bilirubin Urine: NEGATIVE
Glucose, UA: NEGATIVE mg/dL
Hgb urine dipstick: NEGATIVE
Ketones, ur: 80 mg/dL — AB
Nitrite: NEGATIVE
Protein, ur: 30 mg/dL — AB
Specific Gravity, Urine: 1.018 (ref 1.005–1.030)
pH: 6 (ref 5.0–8.0)

## 2017-04-20 LAB — TYPE AND SCREEN
ABO/RH(D): A POS
ABO/RH(D): A POS
ANTIBODY SCREEN: NEGATIVE
Antibody Screen: NEGATIVE

## 2017-04-20 LAB — CBC WITH DIFFERENTIAL/PLATELET
BASOS ABS: 0 10*3/uL (ref 0.0–0.1)
Basophils Relative: 0 %
EOS PCT: 1 %
Eosinophils Absolute: 0.1 10*3/uL (ref 0.0–0.7)
HCT: 34.8 % — ABNORMAL LOW (ref 36.0–46.0)
Hemoglobin: 12 g/dL (ref 12.0–15.0)
LYMPHS PCT: 24 %
Lymphs Abs: 2.6 10*3/uL (ref 0.7–4.0)
MCH: 30.5 pg (ref 26.0–34.0)
MCHC: 34.5 g/dL (ref 30.0–36.0)
MCV: 88.3 fL (ref 78.0–100.0)
MONO ABS: 0.7 10*3/uL (ref 0.1–1.0)
MONOS PCT: 6 %
Neutro Abs: 7.5 10*3/uL (ref 1.7–7.7)
Neutrophils Relative %: 69 %
PLATELETS: 161 10*3/uL (ref 150–400)
RBC: 3.94 MIL/uL (ref 3.87–5.11)
RDW: 13.1 % (ref 11.5–15.5)
WBC: 10.9 10*3/uL — ABNORMAL HIGH (ref 4.0–10.5)

## 2017-04-20 LAB — COMPREHENSIVE METABOLIC PANEL
ALT: 10 U/L — ABNORMAL LOW (ref 14–54)
AST: 29 U/L (ref 15–41)
Albumin: 3.2 g/dL — ABNORMAL LOW (ref 3.5–5.0)
Alkaline Phosphatase: 62 U/L (ref 38–126)
Anion gap: 14 (ref 5–15)
BUN: 7 mg/dL (ref 6–20)
CHLORIDE: 106 mmol/L (ref 101–111)
CO2: 15 mmol/L — ABNORMAL LOW (ref 22–32)
CREATININE: 0.65 mg/dL (ref 0.44–1.00)
Calcium: 9 mg/dL (ref 8.9–10.3)
GFR calc Af Amer: >60 mL/min (ref >60)
Glucose, Bld: 70 mg/dL (ref 65–99)
Potassium: 4.1 mmol/L (ref 3.5–5.1)
Sodium: 135 mmol/L (ref 135–145)
Total Bilirubin: 0.7 mg/dL (ref 0.3–1.2)
Total Protein: 6.7 g/dL (ref 6.5–8.1)

## 2017-04-20 LAB — RAPID URINE DRUG SCREEN, HOSP PERFORMED
Amphetamines: NOT DETECTED
Barbiturates: NOT DETECTED
Benzodiazepines: NOT DETECTED
Cocaine: POSITIVE — AB
Opiates: NOT DETECTED
Tetrahydrocannabinol: POSITIVE — AB

## 2017-04-20 LAB — ETHANOL: ALCOHOL ETHYL (B): 11 mg/dL — AB (ref <5)

## 2017-04-20 LAB — ABO/RH: ABO/RH(D): A POS

## 2017-04-20 LAB — PROTIME-INR
INR: 0.99
Prothrombin Time: 13.1 seconds (ref 11.4–15.2)

## 2017-04-20 MED ORDER — SODIUM CHLORIDE 0.9 % IV SOLN
Freq: Once | INTRAVENOUS | Status: AC
  Administered 2017-04-20: 13:00:00 via INTRAVENOUS

## 2017-04-20 MED ORDER — ACETAMINOPHEN 325 MG PO TABS
650.0000 mg | ORAL_TABLET | ORAL | Status: DC | PRN
  Administered 2017-04-21: 650 mg via ORAL
  Filled 2017-04-20: qty 2

## 2017-04-20 MED ORDER — SODIUM CHLORIDE 0.9 % IV BOLUS (SEPSIS)
1000.0000 mL | Freq: Once | INTRAVENOUS | Status: AC
  Administered 2017-04-20: 1000 mL via INTRAVENOUS

## 2017-04-20 MED ORDER — PRENATAL MULTIVITAMIN CH
1.0000 | ORAL_TABLET | Freq: Every day | ORAL | Status: DC
  Administered 2017-04-21: 1 via ORAL
  Filled 2017-04-20: qty 1

## 2017-04-20 MED ORDER — CALCIUM CARBONATE ANTACID 500 MG PO CHEW: 2.0000 | CHEWABLE_TABLET | ORAL | Status: DC | PRN

## 2017-04-20 MED ORDER — ACETAMINOPHEN 10 MG/ML IV SOLN
1000.0000 mg | Freq: Once | INTRAVENOUS | Status: AC
  Administered 2017-04-20: 1000 mg via INTRAVENOUS
  Filled 2017-04-20: qty 100

## 2017-04-20 MED ORDER — CYCLOBENZAPRINE HCL 5 MG PO TABS
5.0000 mg | ORAL_TABLET | Freq: Three times a day (TID) | ORAL | Status: DC | PRN
  Administered 2017-04-21: 5 mg via ORAL
  Filled 2017-04-20 (×2): qty 1

## 2017-04-20 MED ORDER — ZOLPIDEM TARTRATE 5 MG PO TABS: 5.0000 mg | ORAL_TABLET | Freq: Every evening | ORAL | Status: DC | PRN

## 2017-04-20 MED ORDER — DOCUSATE SODIUM 100 MG PO CAPS: 100.0000 mg | ORAL_CAPSULE | Freq: Every day | ORAL | Status: DC

## 2017-04-20 MED ORDER — BETAMETHASONE SOD PHOS & ACET 6 (3-3) MG/ML IJ SUSP
12.0000 mg | INTRAMUSCULAR | Status: DC
  Administered 2017-04-20: 12 mg via INTRAMUSCULAR
  Filled 2017-04-20: qty 2
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
## 2017-04-20 NOTE — Discharge Planning (Signed)
Follow for disposition needs.  

## 2017-04-20 NOTE — ED Notes (Signed)
OB Rapid response RN Erin at bedside.  Pt on fetal monitor.

## 2017-04-20 NOTE — ED Notes (Signed)
[facility] notified to tx patient to [facility]; Dr. Erin FullingHarraway-Smith receiving
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
## 2017-04-20 NOTE — Progress Notes (Signed)
Patient removed from monitors to go to US.

## 2017-04-20 NOTE — Progress Notes (Signed)
Stopped by to visit with pt before X-rays to be taken, giving her prayer shawl in emotional support, which she appreciated -- her son is 1-y-o.    04/20/17 1400  Clinical Encounter Type  Visited With Patient  Visit Type Follow-up;Psychological support;Spiritual support;Social support;ED  Referral From Chaplain  Spiritual Encounters  Spiritual Needs Emotional   Linda Khan, [address]

## 2017-04-21 ENCOUNTER — Encounter (HOSPITAL_COMMUNITY): Payer: Self-pay

## 2017-04-21 DIAGNOSIS — S3981XD Other specified injuries of abdomen, subsequent encounter: Secondary | ICD-10-CM | POA: Diagnosis not present

## 2017-04-21 DIAGNOSIS — S3981XA Other specified injuries of abdomen, initial encounter: Secondary | ICD-10-CM | POA: Diagnosis not present

## 2017-04-21 DIAGNOSIS — S3991XA Unspecified injury of abdomen, initial encounter: Secondary | ICD-10-CM | POA: Diagnosis not present

## 2017-04-21 DIAGNOSIS — R109 Unspecified abdominal pain: Secondary | ICD-10-CM | POA: Diagnosis not present

## 2017-04-21 DIAGNOSIS — O26893 Other specified pregnancy related conditions, third trimester: Secondary | ICD-10-CM | POA: Diagnosis not present

## 2017-04-21 LAB — ABO/RH: ABO/RH(D): A POS

## 2017-04-21 MED ORDER — BETAMETHASONE SOD PHOS & ACET 6 (3-3) MG/ML IJ SUSP
12.0000 mg | Freq: Once | INTRAMUSCULAR | Status: AC
  Administered 2017-04-21: 12 mg via INTRAMUSCULAR
  Filled 2017-04-21: qty 2

## 2017-04-21 NOTE — Progress Notes (Signed)
Discharge instructions provided, questions answered, pt states understanding, signed and given copy 

## 2017-04-21 NOTE — Discharge Instructions (Signed)
Abdominal Pain During Pregnancy °Belly (abdominal) pain is common during pregnancy. Most of the time, it is not a serious problem. Other times, it can be a sign that something is wrong with the pregnancy. Always tell your doctor if you have belly pain. °Follow these instructions at home: °Monitor your belly pain for any changes. The following actions may help you feel better: °· Do not have sex (intercourse) or put anything in your vagina until you feel better. °· Rest until your pain stops. °· Drink clear fluids if you feel sick to your stomach (nauseous). Do not eat solid food until you feel better. °· Only take medicine as told by your doctor. °· Keep all doctor visits as told. °Get help right away if: °· You are bleeding, leaking fluid, or pieces of tissue come out of your vagina. °· You have more pain or cramping. °· You keep throwing up (vomiting). °· You have pain when you pee (urinate) or have blood in your pee. °· You have a fever. °· You do not feel your baby moving as much. °· You feel very weak or feel like passing out. °· You have trouble breathing, with or without belly pain. °· You have a very bad headache and belly pain. °· You have fluid leaking from your vagina and belly pain. °· You keep having watery poop (diarrhea). °· Your belly pain does not go away after resting, or the pain gets worse. °This information is not intended to replace advice given to you by your health care provider. Make sure you discuss any questions you have with your health care provider. °Document Released: 11/01/2009 Document Revised: 06/21/2016 Document Reviewed: 06/12/2013 °Elsevier Interactive Patient Education © 2017 [employer] ° °
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
## 2017-04-24 ENCOUNTER — Encounter (HOSPITAL_COMMUNITY): Payer: Self-pay

## 2017-04-25 ENCOUNTER — Other Ambulatory Visit (HOSPITAL_COMMUNITY): Payer: Self-pay | Admitting: Obstetrics and Gynecology

## 2017-05-02 ENCOUNTER — Other Ambulatory Visit (HOSPITAL_COMMUNITY): Payer: Self-pay | Admitting: Nurse Practitioner

## 2017-05-02 DIAGNOSIS — F1920 Other psychoactive substance dependence, uncomplicated: Secondary | ICD-10-CM

## 2017-05-02 DIAGNOSIS — O99320 Drug use complicating pregnancy, unspecified trimester: Secondary | ICD-10-CM

## 2017-05-02 DIAGNOSIS — Z3A31 31 weeks gestation of pregnancy: Secondary | ICD-10-CM

## 2017-05-02 DIAGNOSIS — Z3A32 32 weeks gestation of pregnancy: Secondary | ICD-10-CM

## 2017-05-02 LAB — OB RESULTS CONSOLE HIV ANTIBODY (ROUTINE TESTING): HIV: NONREACTIVE

## 2017-05-02 LAB — GLUCOSE, 1 HOUR: GLUCOSE 1 HOUR: 121

## 2017-05-02 LAB — OB RESULTS CONSOLE HGB/HCT, BLOOD
HEMATOCRIT: 36
Hemoglobin: 12

## 2017-05-02 LAB — OB RESULTS CONSOLE RPR: RPR: NONREACTIVE

## 2017-05-14 ENCOUNTER — Encounter (HOSPITAL_COMMUNITY): Payer: Self-pay | Admitting: *Deleted

## 2017-05-14 ENCOUNTER — Inpatient Hospital Stay (HOSPITAL_COMMUNITY)
Admission: AD | Admit: 2017-05-14 | Discharge: 2017-05-14 | Disposition: A | Discharge status: Home / Self Care | Payer: Medicaid Other | Source: Ambulatory Visit | Attending: Obstetrics and Gynecology | Admitting: Obstetrics and Gynecology

## 2017-05-14 DIAGNOSIS — O4700 False labor before 37 completed weeks of gestation, unspecified trimester: Secondary | ICD-10-CM

## 2017-05-14 DIAGNOSIS — O99333 Smoking (tobacco) complicating pregnancy, third trimester: Secondary | ICD-10-CM | POA: Diagnosis not present

## 2017-05-14 DIAGNOSIS — R1030 Lower abdominal pain, unspecified: Secondary | ICD-10-CM | POA: Insufficient documentation

## 2017-05-14 DIAGNOSIS — O26893 Other specified pregnancy related conditions, third trimester: Secondary | ICD-10-CM | POA: Diagnosis present

## 2017-05-14 DIAGNOSIS — Z79899 Other long term (current) drug therapy: Secondary | ICD-10-CM | POA: Diagnosis not present

## 2017-05-14 DIAGNOSIS — O479 False labor, unspecified: Secondary | ICD-10-CM | POA: Diagnosis not present

## 2017-05-14 DIAGNOSIS — O4703 False labor before 37 completed weeks of gestation, third trimester: Secondary | ICD-10-CM | POA: Insufficient documentation

## 2017-05-14 DIAGNOSIS — Z3A30 30 weeks gestation of pregnancy: Secondary | ICD-10-CM | POA: Diagnosis not present

## 2017-05-14 LAB — URINALYSIS, ROUTINE W REFLEX MICROSCOPIC
Bilirubin Urine: NEGATIVE
GLUCOSE, UA: NEGATIVE mg/dL
HGB URINE DIPSTICK: NEGATIVE
Ketones, ur: NEGATIVE mg/dL
NITRITE: NEGATIVE
PROTEIN: NEGATIVE mg/dL
Specific Gravity, Urine: 1.02 (ref 1.005–1.030)
pH: 5 (ref 5.0–8.0)

## 2017-05-14 LAB — POCT FERN TEST: POCT Fern Test: NEGATIVE

## 2017-05-14 LAB — RAPID URINE DRUG SCREEN, HOSP PERFORMED
Amphetamines: NOT DETECTED
BARBITURATES: NOT DETECTED
Benzodiazepines: NOT DETECTED
Cocaine: POSITIVE — AB
OPIATES: NOT DETECTED
TETRAHYDROCANNABINOL: NOT DETECTED

## 2017-05-14 LAB — WET PREP, GENITAL
Sperm: NONE SEEN
Trich, Wet Prep: NONE SEEN
YEAST WET PREP: NONE SEEN

## 2017-05-14 MED ORDER — LACTATED RINGERS IV BOLUS (SEPSIS)
1000.0000 mL | Freq: Once | INTRAVENOUS | Status: AC
  Administered 2017-05-14: 1000 mL via INTRAVENOUS

## 2017-05-14 MED ORDER — NIFEDIPINE 10 MG PO CAPS
10.0000 mg | ORAL_CAPSULE | Freq: Once | ORAL | Status: AC
  Administered 2017-05-14: 10 mg via ORAL
  Filled 2017-05-14: qty 1

## 2017-05-14 MED ORDER — METRONIDAZOLE 500 MG PO TABS: 500.0000 mg | ORAL_TABLET | Freq: Two times a day (BID) | ORAL | 0 refills | Status: DC

## 2017-05-14 MED ORDER — NIFEDIPINE 10 MG PO CAPS
10.0000 mg | ORAL_CAPSULE | Freq: Once | ORAL | Status: AC
Start: 2017-05-14 — End: 2017-05-14
  Administered 2017-05-14: 10 mg via ORAL
  Filled 2017-05-14: qty 1

## 2017-05-14 NOTE — MAU Note (Addendum)
Pt reports leaking of fluid that's "like water" since yesterday afternoon. Pt had intercourse yesterday morning. Pt just started feeling UC's in MAU

## 2017-05-14 NOTE — Discharge Instructions (Signed)

## 2017-05-14 NOTE — MAU Provider Note (Signed)
History     CSN: [id number]  Arrival date and time: 05/14/17 1510   None     Chief Complaint  Patient presents with  . Rupture of Membranes   Patient is a 21 year old G2 P1 at 30 weeks and 4 days who presents with concern for rupture of membranes. She reports for the past 24 hours she has had some larger gush of fluid but for the past several months she's had a thick heavy discharge requiring her to wear a pad. She is reporting some lower abdominal pain that comes and goes. She is been experiencing this she arrived in MAU. She denies any other systemic symptoms. She did recently have sexual intercourse.    OB History    Gravida Para Term Preterm AB Living   2 1 1  0 0 1   SAB TAB Ectopic Multiple Live Births   0 0 0   1      Past Medical History:  Diagnosis Date  . Bell's palsy   . Bell's palsy   . Bronchitis   . Chlamydia   . Gonorrhea   . Trichomonas vaginitis   . UTI (urinary tract infection)     Past Surgical History:  Procedure Laterality Date  . WISDOM TOOTH EXTRACTION      Family History  Problem Relation Age of Onset  . Diabetes Father   . Diabetes Maternal Aunt   . Diabetes Maternal Grandmother   . Diabetes Paternal Grandmother     Social History  Substance Use Topics  . Smoking status: Current Some Day Smoker    Types: Cigarettes, E-cigarettes  . Smokeless tobacco: Never Used  . Alcohol use No     Comment: occasionally    Allergies: No Known Allergies  Prescriptions Prior to Admission  Medication Sig Dispense Refill Last Dose  . cyclobenzaprine (FLEXERIL) 10 MG tablet Take 1 tablet (10 mg total) by mouth 3 (three) times daily as needed for muscle spasms. 30 tablet 2 05/13/2017 at Unknown time  . Prenatal Vit-Fe Fumarate-FA (MULTIVITAMIN-PRENATAL) 27-0.8 MG TABS tablet Take 1 tablet by mouth daily at 12 noon.   05/13/2017 at Unknown time  . acetaminophen (TYLENOL) 325 MG tablet Take 2 tablets (650 mg total) by mouth every 6 (six) hours as needed  for moderate pain. 30 tablet 0 prn  . acetaminophen-codeine (TYLENOL #3) 300-30 MG tablet Take 1-2 tablets by mouth every 6 (six) hours as needed for moderate pain. 12 tablet 0   . metoCLOPramide (REGLAN) 10 MG tablet Take 1 tablet (10 mg total) by mouth every 6 (six) hours. 30 tablet 0   . Prenatal Vit-Fe Fumarate-FA (PRENATAL MULTIVITAMIN) TABS tablet Take 1 tablet by mouth daily at 12 noon.   04/19/2017 at Unknown time    Review of Systems  Constitutional: Negative for chills and fever.  HENT: Negative for congestion and rhinorrhea.   Respiratory: Negative for cough and shortness of breath.   Gastrointestinal: Positive for abdominal pain. Negative for abdominal distention, constipation, diarrhea, nausea and vomiting.  Genitourinary: Negative for difficulty urinating, dysuria, frequency and hematuria.  Musculoskeletal: Negative for arthralgias, back pain and myalgias.  Neurological: Negative for dizziness and weakness.  Psychiatric/Behavioral: Negative for agitation.   Physical Exam   Blood pressure 112/61, pulse 88, temperature 98.3 F (36.8 C), temperature source Oral, resp. rate 15, height 5\' 3"  (1.6 m), weight 122 lb (55.3 kg), last menstrual period 10/07/2016, SpO2 100 %, unknown if currently breastfeeding.  Physical Exam  Vitals reviewed. Constitutional: She is oriented  to person, place, and time. She appears well-developed and well-nourished.  HENT:  Head: Normocephalic and atraumatic.  Cardiovascular: Normal rate and intact distal pulses.   Respiratory: Effort normal. No respiratory distress.  GI: Soft. She exhibits no distension. There is no tenderness. There is no rebound and no guarding.  Genitourinary:  Genitourinary Comments: No pooling, ferning negative, minimal thin white vaginal discharge. Healthy pink vaginal mucosa cervix 1 cm dilated.  Musculoskeletal: Normal range of motion. She exhibits no edema.  Neurological: She is alert and oriented to person, place, and  time. No cranial nerve deficit.  Skin: Skin is warm and dry.  Psychiatric: She has a normal mood and affect. Her behavior is normal.    MAU Course  Procedures  MDM In MA U patient underwent evaluation for rupture of membranes with ferning and pooling which was negative. Wet prep and gonorrhea and chlamydia was performed.  Patient was found to have bacterial vaginosis.  After several minutes on the monitor it was noted that patient did appear to be contracting every 2-3 minutes. She was given 3 doses of Procardia 10 mg by mouth advised to by mouth hydrate. Despite these interventions patient continued to contract every 5-6 minutes. She was given a liter fluid bolus and this seemed to improve both her pain and contractions on the monitor. She still did have some mild uterine irritability.  Cervix was checked twice greater than hour apart she was here in MAU and was found to have no change in her cervix from 1 cm dilated.   Fetal fibronectin was not performed due to the recent intercourse.  Baseline fetal heart rate is 145 with moderate variability reactive. No decelerations are noted   Assessment and Plan  #1 preterm uterine contractions: Improved with Procardia and IV fluids. Advised by mouth hydration and rest on the hot days. Patient voiced understanding. Okay to DC home at this time.  Linda Khan 05/14/2017, 4:27 PM

## 2017-05-14 NOTE — MAU Note (Signed)
Pt reports lower back pain today, pt also reports leaking a clear watery fluid for the last 2 days.

## 2017-05-15 LAB — GC/CHLAMYDIA PROBE AMP (~~LOC~~) NOT AT ARMC
CHLAMYDIA, DNA PROBE: NEGATIVE
NEISSERIA GONORRHEA: NEGATIVE

## 2017-05-17 ENCOUNTER — Encounter (HOSPITAL_COMMUNITY): Payer: Self-pay

## 2017-05-17 ENCOUNTER — Ambulatory Visit (HOSPITAL_COMMUNITY)
Admission: RE | Admit: 2017-05-17 | Discharge: 2017-05-17 | Disposition: A | Discharge status: Home / Self Care | Payer: Medicaid Other | Source: Ambulatory Visit | Attending: Nurse Practitioner | Admitting: Nurse Practitioner

## 2017-05-17 DIAGNOSIS — O99320 Drug use complicating pregnancy, unspecified trimester: Secondary | ICD-10-CM

## 2017-05-17 DIAGNOSIS — F1210 Cannabis abuse, uncomplicated: Secondary | ICD-10-CM | POA: Insufficient documentation

## 2017-05-17 DIAGNOSIS — O99323 Drug use complicating pregnancy, third trimester: Secondary | ICD-10-CM | POA: Insufficient documentation

## 2017-05-17 DIAGNOSIS — F1410 Cocaine abuse, uncomplicated: Secondary | ICD-10-CM | POA: Diagnosis not present

## 2017-05-17 DIAGNOSIS — F1920 Other psychoactive substance dependence, uncomplicated: Secondary | ICD-10-CM

## 2017-05-17 DIAGNOSIS — Z3A31 31 weeks gestation of pregnancy: Secondary | ICD-10-CM | POA: Insufficient documentation

## 2017-05-18 ENCOUNTER — Inpatient Hospital Stay (HOSPITAL_COMMUNITY)
Admission: EM | Admit: 2017-05-18 | Discharge: 2017-05-18 | Disposition: A | Discharge status: Home / Self Care | Payer: Medicaid Other | Attending: Obstetrics & Gynecology | Admitting: Obstetrics & Gynecology

## 2017-05-18 ENCOUNTER — Encounter (HOSPITAL_COMMUNITY): Payer: Self-pay | Admitting: *Deleted

## 2017-05-18 DIAGNOSIS — S29019A Strain of muscle and tendon of unspecified wall of thorax, initial encounter: Secondary | ICD-10-CM

## 2017-05-18 DIAGNOSIS — S29012A Strain of muscle and tendon of back wall of thorax, initial encounter: Secondary | ICD-10-CM | POA: Diagnosis not present

## 2017-05-18 DIAGNOSIS — W109XXA Fall (on) (from) unspecified stairs and steps, initial encounter: Secondary | ICD-10-CM | POA: Diagnosis not present

## 2017-05-18 DIAGNOSIS — Z3A31 31 weeks gestation of pregnancy: Secondary | ICD-10-CM | POA: Insufficient documentation

## 2017-05-18 DIAGNOSIS — Z3689 Encounter for other specified antenatal screening: Secondary | ICD-10-CM

## 2017-05-18 DIAGNOSIS — O9A213 Injury, poisoning and certain other consequences of external causes complicating pregnancy, third trimester: Secondary | ICD-10-CM | POA: Insufficient documentation

## 2017-05-18 MED ORDER — SODIUM CHLORIDE 0.9 % IV BOLUS (SEPSIS)
1000.0000 mL | Freq: Once | INTRAVENOUS | Status: AC
  Administered 2017-05-18: 1000 mL via INTRAVENOUS

## 2017-05-18 MED ORDER — ACETAMINOPHEN 500 MG PO TABS
1000.0000 mg | ORAL_TABLET | Freq: Once | ORAL | Status: AC
  Administered 2017-05-18: 1000 mg via ORAL
  Filled 2017-05-18: qty 2

## 2017-05-18 NOTE — ED Triage Notes (Signed)
Walking down steps with 21 year old, [redacted] weeks pregnant, fell 10 steps(7-8 feet), having contractions states that it is different from her braxton hicks. Pt also complains of pelvic/girdle pain. Hx of preterm labor. Contractions are irregular but close to every 5 minutes. Health dept is OB. Pt seen OB 3 days ago and dx with preterm labor Pt already 1.5 cm dilated. Thoracic pain. No LOC. Fall was approximately at 1545.

## 2017-05-18 NOTE — Discharge Instructions (Signed)
Braxton Hicks Contractions °Contractions of the uterus can occur throughout pregnancy, but they are not always a sign that you are in labor. You may have practice contractions called Braxton Hicks contractions. These false labor contractions are sometimes confused with true labor. °What are Braxton Hicks contractions? °Braxton Hicks contractions are tightening movements that occur in the muscles of the uterus before labor. Unlike true labor contractions, these contractions do not result in opening (dilation) and thinning of the cervix. Toward the end of pregnancy (32-34 weeks), Braxton Hicks contractions can happen more often and may become stronger. These contractions are sometimes difficult to tell apart from true labor because they can be very uncomfortable. You should not feel embarrassed if you go to the hospital with false labor. °Sometimes, the only way to tell if you are in true labor is for your health care provider to look for changes in the cervix. The health care provider will do a physical exam and may monitor your contractions. If you are not in true labor, the exam should show that your cervix is not dilating and your water has not broken. °If there are no prenatal problems or other health problems associated with your pregnancy, it is completely safe for you to be sent home with false labor. You may continue to have Braxton Hicks contractions until you go into true labor. °How can I tell the difference between true labor and false labor? °· Differences °? False labor °? Contractions last 30-70 seconds.: Contractions are usually shorter and not as strong as true labor contractions. °? Contractions become very regular.: Contractions are usually irregular. °? Discomfort is usually felt in the top of the uterus, and it spreads to the lower abdomen and low back.: Contractions are often felt in the front of the lower abdomen and in the groin. °? Contractions do not go away with walking.: Contractions may  go away when you walk around or change positions while lying down. °? Contractions usually become more intense and increase in frequency.: Contractions get weaker and are shorter-lasting as time goes on. °? The cervix dilates and gets thinner.: The cervix usually does not dilate or become thin. °Follow these instructions at home: °· Take over-the-counter and prescription medicines only as told by your health care provider. °· Keep up with your usual exercises and follow other instructions from your health care provider. °· Eat and drink lightly if you think you are going into labor. °· If Braxton Hicks contractions are making you uncomfortable: °? Change your position from lying down or resting to walking, or change from walking to resting. °? Sit and rest in a tub of warm water. °? Drink enough fluid to keep your urine clear or pale yellow. Dehydration may cause these contractions. °? Do slow and deep breathing several times an hour. °· Keep all follow-up prenatal visits as told by your health care provider. This is important. °Contact a health care provider if: °· You have a fever. °· You have continuous pain in your abdomen. °Get help right away if: °· Your contractions become stronger, more regular, and closer together. °· You have fluid leaking or gushing from your vagina. °· You pass blood-tinged mucus (bloody show). °· You have bleeding from your vagina. °· You have low back pain that you never had before. °· You feel your baby’s head pushing down and causing pelvic pressure. °· Your baby is not moving inside you as much as it used to. °Summary °· Contractions that occur before labor are   called Braxton Hicks contractions, false labor, or practice contractions. °· Braxton Hicks contractions are usually shorter, weaker, farther apart, and less regular than true labor contractions. True labor contractions usually become progressively stronger and regular and they become more frequent. °· Manage discomfort from  Braxton Hicks contractions by changing position, resting in a warm bath, drinking plenty of water, or practicing deep breathing. °This information is not intended to replace advice given to you by your health care provider. Make sure you discuss any questions you have with your health care provider. °Document Released: 11/13/2005 Document Revised: 10/02/2016 Document Reviewed: 10/02/2016 °Elsevier Interactive Patient Education © 2017 [employer] ° ° °Fetal Movement Counts °Patient Name: ________________________________________________ Patient Due Date: ____________________ °What is a fetal movement count? °A fetal movement count is the number of times that you feel your baby move during a certain amount of time. This may also be called a fetal kick count. A fetal movement count is recommended for every pregnant woman. You may be asked to start counting fetal movements as early as week 28 of your pregnancy. °Pay attention to when your baby is most active. You may notice your baby's sleep and wake cycles. You may also notice things that make your baby move more. You should do a fetal movement count: °· When your baby is normally most active. °· At the same time each day. ° °A good time to count movements is while you are resting, after having something to eat and drink. °How do I count fetal movements? °1. Find a quiet, comfortable area. Sit, or lie down on your side. °2. Write down the date, the start time and stop time, and the number of movements that you felt between those two times. Take this information with you to your health care visits. °3. For 2 hours, count kicks, flutters, swishes, rolls, and jabs. You should feel at least 10 movements during 2 hours. °4. You may stop counting after you have felt 10 movements. °5. If you do not feel 10 movements in 2 hours, have something to eat and drink. Then, keep resting and counting for 1 hour. If you feel at least 4 movements during that hour, you may stop  counting. °Contact a health care provider if: °· You feel fewer than 4 movements in 2 hours. °· Your baby is not moving like he or she usually does. °Date: ____________ Start time: ____________ Stop time: ____________ Movements: ____________ °Date: ____________ Start time: ____________ Stop time: ____________ Movements: ____________ °Date: ____________ Start time: ____________ Stop time: ____________ Movements: ____________ °Date: ____________ Start time: ____________ Stop time: ____________ Movements: ____________ °Date: ____________ Start time: ____________ Stop time: ____________ Movements: ____________ °Date: ____________ Start time: ____________ Stop time: ____________ Movements: ____________ °Date: ____________ Start time: ____________ Stop time: ____________ Movements: ____________ °Date: ____________ Start time: ____________ Stop time: ____________ Movements: ____________ °Date: ____________ Start time: ____________ Stop time: ____________ Movements: ____________ °This information is not intended to replace advice given to you by your health care provider. Make sure you discuss any questions you have with your health care provider. °Document Released: 12/13/2006 Document Revised: 07/12/2016 Document Reviewed: 12/23/2015 °Elsevier Interactive Patient Education © 2018 [employer] ° °
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
## 2017-05-18 NOTE — Progress Notes (Signed)
Chaplain followed up with patient's nurse for referral from outgoing chaplain to this case. RN stated that patient was being prepped for transportation and was doing well.  Chaplain requested a callback if assistance was requested.    Theodoro Parmaalacios, Jaanai Salemi N, Chaplain [phone]    05/18/17 1700  Clinical Encounter Type  Visited With Patient not available  Visit Type Follow-up  Referral From Chaplain  Stress Factors  Patient Stress Factors Health changes

## 2017-05-18 NOTE — MAU Provider Note (Signed)
  History     CSN: [id number]  Arrival date and time: 05/18/17 1637   First Provider Initiated Contact with Patient 05/18/17 1929      Chief Complaint  Patient presents with  . Trauma    s/p fall at approx 1550 this afternoon   G2P1001 @31 .1 wks here s/p fall. She slipped coming down stairs earlier. She hit her back coming down. No abdominal contant. No head injury or LOC. Reports good FM. No VB. Reports some ctx when bladder is full, improves after emptying. Upper back is sore.   OB History    Gravida Para Term Preterm AB Living   2 1 1     1    SAB TAB Ectopic Multiple Live Births           1      History reviewed. No pertinent past medical history.  History reviewed. No pertinent surgical history.  History reviewed. No pertinent family history.  Social History  Substance Use Topics  . Smoking status: Never Smoker  . Smokeless tobacco: Never Used  . Alcohol use No    Allergies: No Known Allergies  No prescriptions prior to admission.    Review of Systems  Gastrointestinal: Negative for abdominal pain.  Genitourinary: Negative for vaginal bleeding.  Musculoskeletal: Positive for back pain.   Physical Exam   Blood pressure 108/70, pulse 84, temperature 98.7 F (37.1 C), temperature source Oral, resp. rate 20, height 5\' 4"  (1.626 m), weight 57.6 kg (127 lb), SpO2 100 %.  Physical Exam  Constitutional: She is oriented to person, place, and time. She appears well-developed and well-nourished. No distress.  HENT:  Head: Normocephalic and atraumatic.  Neck: Normal range of motion.  Cardiovascular: Normal rate.   Respiratory: Effort normal.  GI: Soft. She exhibits no distension. There is no tenderness.  gravid  Genitourinary:  Genitourinary Comments: SVE 1/thick  Musculoskeletal: Normal range of motion.       Cervical back: Normal. She exhibits no tenderness.       Thoracic back: She exhibits tenderness.       Lumbar back: Normal. She exhibits no tenderness.   Neurological: She is alert and oriented to person, place, and time.  Skin: Skin is warm and dry.  Psychiatric: She has a normal mood and affect.  EFM: 145 bpm, mod variability, + accels, no decels Toco: irregular  MAU Course  Procedures Prolonged EFM  MDM Pt feels well. Back pain improved after heating pad. FHT reassuring. No evidence of abruption. Stable for discharge home.  Assessment and Plan   1. Acute thoracic myofascial strain, initial encounter   2. Traumatic injury during pregnancy in third trimester   3. [redacted] weeks gestation of pregnancy   4. NST (non-stress test) reactive    Discharge home Follow up in OB office as scheduled Abruption/return precautions  Allergies as of 05/18/2017   No Known Allergies     Medication List    You have not been prescribed any medications.    Donette LarryMelanie Siomara Burkel, CNM 05/18/2017, 8:17 PM

## 2017-05-18 NOTE — ED Notes (Signed)
Pt's yellow shirt and 2 black sandals placed in pt belonging bag and [facility]

## 2017-05-18 NOTE — Progress Notes (Signed)
Dr Shawnie Ponspratt notified that pt presents as a level two trauma post fall. Pt reportedly fell down approx 10steps landing on her back and is now complaining of back, neck, pelvic, and abdominal pain.  MD made aware that fhr tracing is just now becoming reactive and that pt is having ui/ucs approx 4-55min.  MD says to check pt cervix and if she isnt in active labor that she should be transported to [facility] for four hrs of monitoring.

## 2017-05-18 NOTE — Progress Notes (Signed)
   05/18/17 1635  Clinical Encounter Type  Visited With Health care provider  Visit Type ED  Spiritual Encounters  Spiritual Needs Emotional  Stress Factors  Patient Stress Factors Health changes  Awaiting name and family information. Have on call chaplain follow up.

## 2017-05-21 ENCOUNTER — Encounter (HOSPITAL_COMMUNITY): Payer: Self-pay

## 2017-05-24 ENCOUNTER — Encounter (HOSPITAL_COMMUNITY): Payer: Self-pay

## 2017-05-24 ENCOUNTER — Ambulatory Visit (HOSPITAL_COMMUNITY)
Admission: RE | Admit: 2017-05-24 | Discharge: 2017-05-24 | Disposition: A | Discharge status: Home / Self Care | Payer: Medicaid Other | Source: Ambulatory Visit | Attending: Nurse Practitioner | Admitting: Nurse Practitioner

## 2017-06-02 ENCOUNTER — Inpatient Hospital Stay (HOSPITAL_COMMUNITY)
Admission: AD | Admit: 2017-06-02 | Discharge: 2017-06-02 | Disposition: A | Discharge status: Home / Self Care | Payer: Medicaid Other | Source: Ambulatory Visit | Attending: Obstetrics and Gynecology | Admitting: Obstetrics and Gynecology

## 2017-06-02 ENCOUNTER — Encounter (HOSPITAL_COMMUNITY): Payer: Self-pay

## 2017-06-02 DIAGNOSIS — Z3A33 33 weeks gestation of pregnancy: Secondary | ICD-10-CM | POA: Insufficient documentation

## 2017-06-02 DIAGNOSIS — R109 Unspecified abdominal pain: Secondary | ICD-10-CM | POA: Diagnosis not present

## 2017-06-02 DIAGNOSIS — O26893 Other specified pregnancy related conditions, third trimester: Secondary | ICD-10-CM

## 2017-06-02 LAB — URINALYSIS, ROUTINE W REFLEX MICROSCOPIC
BILIRUBIN URINE: NEGATIVE
Glucose, UA: NEGATIVE mg/dL
Hgb urine dipstick: NEGATIVE
Ketones, ur: NEGATIVE mg/dL
NITRITE: NEGATIVE
PH: 6 (ref 5.0–8.0)
Protein, ur: NEGATIVE mg/dL
SPECIFIC GRAVITY, URINE: 1.004 — AB (ref 1.005–1.030)

## 2017-06-02 LAB — RAPID URINE DRUG SCREEN, HOSP PERFORMED
AMPHETAMINES: NOT DETECTED
BARBITURATES: NOT DETECTED
Benzodiazepines: NOT DETECTED
COCAINE: NOT DETECTED
Opiates: NOT DETECTED
TETRAHYDROCANNABINOL: NOT DETECTED

## 2017-06-02 NOTE — MAU Provider Note (Signed)
History     CSN: [id number]  Arrival date and time: 06/02/17 1507   First Provider Initiated Contact with Patient 06/02/17 1532      Chief Complaint  Patient presents with  . Abdominal Pain  . Contractions   HPI Linda Khan is a 21 y.o. G2P1001 at 2577w2d who presents complaining of contractions since last night. She states they are irregular and she hasn't timed them. She rates the contraction pain a 5/10 and has not tried anything for the pain. She denies any vaginal bleeding or leaking of fluid. Reports good fetal movement.   OB History    Gravida Para Term Preterm AB Living   2 1 1  0 0 1   SAB TAB Ectopic Multiple Live Births   0 0 0   1      Past Medical History:  Diagnosis Date  . Bell's palsy   . Bell's palsy   . Bronchitis   . Chlamydia   . Gonorrhea   . Trichomonas vaginitis   . UTI (urinary tract infection)     Past Surgical History:  Procedure Laterality Date  . WISDOM TOOTH EXTRACTION      Family History  Problem Relation Age of Onset  . Diabetes Father   . Diabetes Maternal Aunt   . Diabetes Maternal Grandmother   . Diabetes Paternal Grandmother     Social History  Substance Use Topics  . Smoking status: Never Smoker  . Smokeless tobacco: Never Used  . Alcohol use No     Comment: occasionally    Allergies: No Known Allergies  Prescriptions Prior to Admission  Medication Sig Dispense Refill Last Dose  . acetaminophen (TYLENOL) 325 MG tablet Take 2 tablets (650 mg total) by mouth every 6 (six) hours as needed for moderate pain. (Patient not taking: Reported on 05/14/2017) 30 tablet 0 Not Taking at Unknown time  . acetaminophen-codeine (TYLENOL #3) 300-30 MG tablet Take 1-2 tablets by mouth every 6 (six) hours as needed for moderate pain. (Patient not taking: Reported on 05/14/2017) 12 tablet 0 Not Taking at Unknown time  . calcium carbonate (TUMS - DOSED IN MG ELEMENTAL CALCIUM) 500 MG chewable tablet Chew 1 tablet by mouth daily as needed for  indigestion or heartburn.   Taking  . cyclobenzaprine (FLEXERIL) 10 MG tablet Take 1 tablet (10 mg total) by mouth 3 (three) times daily as needed for muscle spasms. (Patient not taking: Reported on 05/17/2017) 30 tablet 2 Not Taking  . metoCLOPramide (REGLAN) 10 MG tablet Take 1 tablet (10 mg total) by mouth every 6 (six) hours. (Patient not taking: Reported on 05/14/2017) 30 tablet 0 Not Taking at Unknown time  . metroNIDAZOLE (FLAGYL) 500 MG tablet Take 1 tablet (500 mg total) by mouth 2 (two) times daily. 14 tablet 0 Taking  . Prenatal Vit-Fe Fumarate-FA (PRENATAL MULTIVITAMIN) TABS tablet Take 1 tablet by mouth daily at 12 noon.   Taking    Review of Systems  Constitutional: Negative.  Negative for chills and fever.  Respiratory: Negative.  Negative for shortness of breath.   Cardiovascular: Negative.  Negative for chest pain.  Gastrointestinal: Positive for abdominal pain.  Genitourinary: Negative.  Negative for dysuria, vaginal bleeding and vaginal discharge.  Neurological: Negative.  Negative for dizziness and headaches.   Physical Exam   Blood pressure 105/72, pulse (!) 112, temperature 98.3 F (36.8 C), temperature source Oral, resp. rate 18, last menstrual period 10/07/2016, unknown if currently breastfeeding.  Physical Exam  Nursing note and  vitals reviewed. Constitutional: She appears well-developed and well-nourished.  HENT:  Head: Normocephalic and atraumatic.  Eyes: Conjunctivae are normal. No scleral icterus.  Cardiovascular: Normal rate, regular rhythm and normal heart sounds.   Respiratory: Effort normal and breath sounds normal. No respiratory distress.  GI: Soft. She exhibits no distension. There is no tenderness.  Genitourinary: Vagina normal.  Neurological: She is alert.  Skin: Skin is warm and dry.  Psychiatric: She has a normal mood and affect. Her behavior is normal. Judgment and thought content normal.   Dilation: 1 Effacement (%): Thick Cervical Position:  Middle Station: -3 Presentation: Vertex Exam by:: Ma Hillock SNM  Fetal Tracing:  Baseline: 140bpm Variability: moderate Accelerations: 15x15 Decelerations: none  Toco: uterine irritability  MAU Course  Procedures Results for orders placed or performed during the hospital encounter of 06/02/17 (from the past 24 hour(s))  Urinalysis, Routine w reflex microscopic     Status: Abnormal   Collection Time: 06/02/17  3:15 PM  Result Value Ref Range   Color, Urine AMBER (A) YELLOW   APPearance CLEAR CLEAR   Specific Gravity, Urine 1.004 (L) 1.005 - 1.030   pH 6.0 5.0 - 8.0   Glucose, UA NEGATIVE NEGATIVE mg/dL   Hgb urine dipstick NEGATIVE NEGATIVE   Bilirubin Urine NEGATIVE NEGATIVE   Ketones, ur NEGATIVE NEGATIVE mg/dL   Protein, ur NEGATIVE NEGATIVE mg/dL   Nitrite NEGATIVE NEGATIVE   Leukocytes, UA SMALL (A) NEGATIVE   RBC / HPF 0-5 0 - 5 RBC/hpf   WBC, UA 6-30 0 - 5 WBC/hpf   Bacteria, UA RARE (A) NONE SEEN   Squamous Epithelial / LPF 6-30 (A) NONE SEEN   MDM UA Reactive NST, uterine irritability noted.  No cervical change from previous exam.  Assessment and Plan   1. Abdominal pain in pregnancy, third trimester    -Discharge patient home in stable condition -Reviewed labor precautions and when to return -Keep routine OB appointment at health department -Encourage increased water intake -Encouraged to return here or to other Urgent Care/ED if she develops worsening of symptoms, increase in pain, fever, or other concerning symptoms.   Cleone Slim SNM 06/02/2017, 3:41 PM

## 2017-06-02 NOTE — Progress Notes (Addendum)
g2P1 @ 33.[redacted] wksga. Presents to triage for r/o labor. Ctx since last night. Denies LOF or bleeding. +FM. VSS see flow sheet for details.   Last intercourse was two days ago.   EFM applied  Hx: SVD @ 40=[redacted] wksga  Provider at bs assessing pt.   1610: Discharge instructions given with pt understanding. Pt left unit via ambulatory with friend

## 2017-06-02 NOTE — Discharge Instructions (Signed)

## 2017-06-06 ENCOUNTER — Ambulatory Visit (INDEPENDENT_AMBULATORY_CARE_PROVIDER_SITE_OTHER): Payer: Medicaid Other | Admitting: Obstetrics and Gynecology

## 2017-06-06 ENCOUNTER — Encounter: Payer: Self-pay | Admitting: Obstetrics and Gynecology

## 2017-06-06 VITALS — BP 122/69 | HR 89 | Wt 128.1 lb

## 2017-06-06 DIAGNOSIS — O99323 Drug use complicating pregnancy, third trimester: Secondary | ICD-10-CM | POA: Diagnosis not present

## 2017-06-06 DIAGNOSIS — O0993 Supervision of high risk pregnancy, unspecified, third trimester: Secondary | ICD-10-CM | POA: Diagnosis not present

## 2017-06-06 DIAGNOSIS — F1410 Cocaine abuse, uncomplicated: Secondary | ICD-10-CM | POA: Diagnosis not present

## 2017-06-06 DIAGNOSIS — IMO0002 Reserved for concepts with insufficient information to code with codable children: Secondary | ICD-10-CM | POA: Insufficient documentation

## 2017-06-06 DIAGNOSIS — Z91410 Personal history of adult physical and sexual abuse: Secondary | ICD-10-CM

## 2017-06-06 DIAGNOSIS — Z87898 Personal history of other specified conditions: Secondary | ICD-10-CM

## 2017-06-06 DIAGNOSIS — O99320 Drug use complicating pregnancy, unspecified trimester: Secondary | ICD-10-CM

## 2017-06-06 DIAGNOSIS — F1411 Cocaine abuse, in remission: Secondary | ICD-10-CM
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
## 2017-06-07 ENCOUNTER — Encounter: Payer: Self-pay | Admitting: *Deleted

## 2017-06-14 ENCOUNTER — Ambulatory Visit (HOSPITAL_COMMUNITY)
Admission: RE | Admit: 2017-06-14 | Discharge: 2017-06-14 | Disposition: A | Discharge status: Home / Self Care | Payer: Medicaid Other | Source: Ambulatory Visit | Attending: Obstetrics and Gynecology | Admitting: Obstetrics and Gynecology

## 2017-06-14 ENCOUNTER — Ambulatory Visit (INDEPENDENT_AMBULATORY_CARE_PROVIDER_SITE_OTHER): Payer: Medicaid Other | Admitting: Obstetrics and Gynecology

## 2017-06-14 ENCOUNTER — Encounter (HOSPITAL_COMMUNITY): Payer: Self-pay

## 2017-06-14 VITALS — BP 107/68 | HR 118

## 2017-06-14 DIAGNOSIS — O99323 Drug use complicating pregnancy, third trimester: Secondary | ICD-10-CM | POA: Diagnosis not present

## 2017-06-14 DIAGNOSIS — O99333 Smoking (tobacco) complicating pregnancy, third trimester: Secondary | ICD-10-CM | POA: Diagnosis not present

## 2017-06-14 DIAGNOSIS — F1410 Cocaine abuse, uncomplicated: Secondary | ICD-10-CM | POA: Diagnosis not present

## 2017-06-14 DIAGNOSIS — Z3A35 35 weeks gestation of pregnancy: Secondary | ICD-10-CM | POA: Diagnosis not present

## 2017-06-14 DIAGNOSIS — F1411 Cocaine abuse, in remission: Secondary | ICD-10-CM

## 2017-06-14 DIAGNOSIS — O0993 Supervision of high risk pregnancy, unspecified, third trimester: Secondary | ICD-10-CM

## 2017-06-14 DIAGNOSIS — IMO0002 Reserved for concepts with insufficient information to code with codable children: Secondary | ICD-10-CM

## 2017-06-14 DIAGNOSIS — O99320 Drug use complicating pregnancy, unspecified trimester: Principal | ICD-10-CM

## 2017-06-14 NOTE — Progress Notes (Signed)
NST Note Date: 06/14/2017 Gestational Age: 23/0 FHT: 145 baseline, positive accelerations, negative deceleration, moderate variability Toco: quiet Time: 20 minutes  A/P: rNST. Continue current plan of care  Cornelia Copaharlie Dirck Butch, Jr MD Attending [facility](Faculty Practice)

## 2017-06-14 NOTE — Progress Notes (Signed)
US for growth and BPP today 

## 2017-06-17 ENCOUNTER — Inpatient Hospital Stay (HOSPITAL_COMMUNITY)
Admission: AD | Admit: 2017-06-17 | Discharge: 2017-06-17 | Disposition: A | Discharge status: Home / Self Care | Payer: Medicaid Other | Source: Ambulatory Visit | Attending: Obstetrics and Gynecology | Admitting: Obstetrics and Gynecology

## 2017-06-17 ENCOUNTER — Encounter (HOSPITAL_COMMUNITY): Payer: Self-pay | Admitting: *Deleted

## 2017-06-17 ENCOUNTER — Inpatient Hospital Stay (EMERGENCY_DEPARTMENT_HOSPITAL)
Admission: AD | Admit: 2017-06-17 | Discharge: 2017-06-17 | Disposition: A | Discharge status: Home / Self Care | Payer: Medicaid Other | Source: Ambulatory Visit | Attending: Obstetrics & Gynecology | Admitting: Obstetrics & Gynecology

## 2017-06-17 DIAGNOSIS — F1910 Other psychoactive substance abuse, uncomplicated: Secondary | ICD-10-CM | POA: Diagnosis not present

## 2017-06-17 DIAGNOSIS — O26893 Other specified pregnancy related conditions, third trimester: Secondary | ICD-10-CM | POA: Diagnosis not present

## 2017-06-17 DIAGNOSIS — O4703 False labor before 37 completed weeks of gestation, third trimester: Secondary | ICD-10-CM | POA: Insufficient documentation

## 2017-06-17 DIAGNOSIS — O99323 Drug use complicating pregnancy, third trimester: Secondary | ICD-10-CM | POA: Diagnosis not present

## 2017-06-17 DIAGNOSIS — O479 False labor, unspecified: Secondary | ICD-10-CM

## 2017-06-17 DIAGNOSIS — R109 Unspecified abdominal pain: Secondary | ICD-10-CM

## 2017-06-17 DIAGNOSIS — Z3A35 35 weeks gestation of pregnancy: Secondary | ICD-10-CM | POA: Insufficient documentation

## 2017-06-17 LAB — URINALYSIS, ROUTINE W REFLEX MICROSCOPIC
BILIRUBIN URINE: NEGATIVE
Bilirubin Urine: NEGATIVE
GLUCOSE, UA: NEGATIVE mg/dL
GLUCOSE, UA: NEGATIVE mg/dL
HGB URINE DIPSTICK: NEGATIVE
Hgb urine dipstick: NEGATIVE
KETONES UR: NEGATIVE mg/dL
Ketones, ur: NEGATIVE mg/dL
NITRITE: NEGATIVE
Nitrite: NEGATIVE
PH: 6 (ref 5.0–8.0)
PH: 6 (ref 5.0–8.0)
Protein, ur: NEGATIVE mg/dL
Protein, ur: NEGATIVE mg/dL
SPECIFIC GRAVITY, URINE: 1.003 — AB (ref 1.005–1.030)
Specific Gravity, Urine: 1.004 — ABNORMAL LOW (ref 1.005–1.030)

## 2017-06-17 MED ORDER — OXYCODONE-ACETAMINOPHEN 5-325 MG PO TABS
2.0000 | ORAL_TABLET | Freq: Once | ORAL | Status: AC
  Administered 2017-06-17: 2 via ORAL
  Filled 2017-06-17: qty 2

## 2017-06-17 MED ORDER — CYCLOBENZAPRINE HCL 10 MG PO TABS: 10.0000 mg | ORAL_TABLET | Freq: Two times a day (BID) | ORAL | 0 refills | Status: DC | PRN

## 2017-06-17 NOTE — MAU Provider Note (Signed)
History   G2P1001 @ 35.3 wks in with co pubic pain and pressure that has been going on for several days. No vag bleeding or ROM  CSN: [id number]  Arrival date & time 06/17/17  1830   None     No chief complaint on file.   HPI  Past Medical History:  Diagnosis Date  . Bell's palsy   . Bell's palsy   . Bronchitis   . Chlamydia   . Gonorrhea   . Trichomonas vaginitis   . UTI (urinary tract infection)     Past Surgical History:  Procedure Laterality Date  . WISDOM TOOTH EXTRACTION      Family History  Problem Relation Age of Onset  . Diabetes Maternal Aunt   . Diabetes Maternal Grandmother   . Diabetes Paternal Grandmother     Social History  Substance Use Topics  . Smoking status: Never Smoker  . Smokeless tobacco: Never Used  . Alcohol use No     Comment: occasionally    OB History    Gravida Para Term Preterm AB Living   2 1 1  0 0 1   SAB TAB Ectopic Multiple Live Births   0 0 0   1      Review of Systems  Constitutional: Negative.   HENT: Negative.   Eyes: Negative.   Respiratory: Negative.   Gastrointestinal: Negative.   Endocrine: Negative.   Genitourinary: Positive for pelvic pain.  Musculoskeletal: Negative.   Skin: Negative.     Allergies  Patient has no known allergies.  Home Medications    BP 123/65 (BP Location: Left Arm)   Pulse 94   Temp 98.5 F (36.9 C) (Oral)   Resp 16   LMP 10/07/2016   SpO2 99%   Physical Exam  Constitutional: She is oriented to person, place, and time. She appears well-developed and well-nourished.  HENT:  Head: Normocephalic.  Eyes: Pupils are equal, round, and reactive to light.  Neck: Normal range of motion.  Cardiovascular: Normal rate, normal heart sounds and intact distal pulses.   Genitourinary: Vagina normal and uterus normal.  Musculoskeletal: Normal range of motion.  Neurological: She is alert and oriented to person, place, and time.  Skin: Skin is warm.  Psychiatric: She has a normal  mood and affect. Her behavior is normal. Judgment and thought content normal.    MAU Course  Procedures (including critical care time)  Labs Reviewed  URINALYSIS, ROUTINE W REFLEX MICROSCOPIC   No results found.   1. Abdominal pain in pregnancy, third trimester       MDM  SVE 2/th/post/blts. FHR patternreassuring. No uc's palpated. Will treat pain and get NST and d/c home with Rx for flexeril.
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
## 2017-06-17 NOTE — Discharge Instructions (Signed)

## 2017-06-17 NOTE — MAU Note (Signed)
Urine in lab 

## 2017-06-17 NOTE — MAU Note (Signed)
Pt cervix 3, thick and high. Pt was check by Zerita Boersarlene Lawson ,CNM at 18:45.

## 2017-06-17 NOTE — MAU Note (Addendum)
ctxs since 2200. Does not know if leaking fld and does not think has had any bleeding. Feels like cramping on R side that does not go away and then ctxs on top of that

## 2017-06-17 NOTE — MAU Note (Signed)
Pain everywhere, started yesterday night

## 2017-06-18 ENCOUNTER — Telehealth: Payer: Self-pay

## 2017-06-18 ENCOUNTER — Other Ambulatory Visit: Payer: Medicaid Other | Admitting: Family Medicine

## 2017-06-18 NOTE — Telephone Encounter (Signed)
Received call from patient who stated she is no so much pain and not able to get any sleep. Patient reports being seen at MAU yesterday and giving flexeril to help with pain however she has not taking the medications as recommend . She stated she does not like the way it makes her feel. I advised if she is in more pain then yesterday she needs to report back to MAU to be seen. I also recommended patient try and take one flexril to see if she can get any relief. Patient voice understanding at this time.

## 2017-06-20 ENCOUNTER — Encounter: Payer: Self-pay | Admitting: General Practice

## 2017-06-21 ENCOUNTER — Other Ambulatory Visit: Payer: Medicaid Other

## 2017-06-21 ENCOUNTER — Encounter: Payer: Medicaid Other | Admitting: Family Medicine

## 2017-06-22 ENCOUNTER — Ambulatory Visit (INDEPENDENT_AMBULATORY_CARE_PROVIDER_SITE_OTHER): Payer: Medicaid Other | Admitting: *Deleted

## 2017-06-22 ENCOUNTER — Other Ambulatory Visit (HOSPITAL_COMMUNITY)
Admission: RE | Admit: 2017-06-22 | Discharge: 2017-06-22 | Disposition: A | Discharge status: Home / Self Care | Payer: Medicaid Other | Source: Ambulatory Visit | Attending: Obstetrics & Gynecology | Admitting: Obstetrics & Gynecology

## 2017-06-22 ENCOUNTER — Ambulatory Visit (INDEPENDENT_AMBULATORY_CARE_PROVIDER_SITE_OTHER): Payer: Medicaid Other | Admitting: Obstetrics & Gynecology

## 2017-06-22 ENCOUNTER — Encounter (HOSPITAL_COMMUNITY): Payer: Self-pay

## 2017-06-22 ENCOUNTER — Ambulatory Visit: Payer: Self-pay

## 2017-06-22 VITALS — BP 113/65 | HR 94 | Wt 130.3 lb

## 2017-06-22 DIAGNOSIS — O99323 Drug use complicating pregnancy, third trimester: Secondary | ICD-10-CM

## 2017-06-22 DIAGNOSIS — O99320 Drug use complicating pregnancy, unspecified trimester: Principal | ICD-10-CM

## 2017-06-22 DIAGNOSIS — O0993 Supervision of high risk pregnancy, unspecified, third trimester: Secondary | ICD-10-CM | POA: Insufficient documentation

## 2017-06-22 DIAGNOSIS — F1410 Cocaine abuse, uncomplicated: Secondary | ICD-10-CM

## 2017-06-22 DIAGNOSIS — F1910 Other psychoactive substance abuse, uncomplicated: Secondary | ICD-10-CM

## 2017-06-22 LAB — OB RESULTS CONSOLE GBS: STREP GROUP B AG: NEGATIVE

## 2017-06-22 LAB — OB RESULTS CONSOLE GC/CHLAMYDIA: Gonorrhea: NEGATIVE

## 2017-06-22 NOTE — Progress Notes (Signed)
NICU NAS tour scheduled on 07/05/17 at 1:00, prior to NST and OB appointment in [facility].

## 2017-06-22 NOTE — Progress Notes (Signed)
Pt very uncomfortable during UC's - states back pain is constant. Inadeguate resting time noted per EFM. Pt taken to exam room for Cx exam by Dr. Marice Potterove.   Pt informed that the ultrasound is considered a limited OB ultrasound and is not intended to be a complete ultrasound exam.  Patient also informed that the ultrasound is not being completed with the intent of assessing for fetal or placental anomalies or any pelvic abnormalities.  Explained that the purpose of today's ultrasound is to assess for presentation, BPP and amniotic fluid volume.  Patient acknowledges the purpose of the exam and the limitations of the study.

## 2017-06-22 NOTE — Progress Notes (Signed)
Pt reports severe back pain and contractions since 0720 today.
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
## 2017-06-25 LAB — GC/CHLAMYDIA PROBE AMP (~~LOC~~) NOT AT ARMC
CHLAMYDIA, DNA PROBE: NEGATIVE
NEISSERIA GONORRHEA: NEGATIVE

## 2017-06-26 ENCOUNTER — Other Ambulatory Visit: Payer: Self-pay | Admitting: Family Medicine

## 2017-06-27 LAB — CULTURE, BETA STREP (GROUP B ONLY): Strep Gp B Culture: NEGATIVE

## 2017-06-28 ENCOUNTER — Encounter: Payer: Medicaid Other | Admitting: Advanced Practice Midwife

## 2017-06-28 ENCOUNTER — Other Ambulatory Visit: Payer: Medicaid Other

## 2017-06-28 ENCOUNTER — Telehealth: Payer: Self-pay | Admitting: *Deleted

## 2017-06-28 NOTE — Telephone Encounter (Signed)
Called pt and left message stating that she missed an appt in the office this morning.  It is important for her to be seen today if possible and we have open appts. Please call back to schedule appt this afternoon if she is able to come in.

## 2017-07-05 ENCOUNTER — Telehealth: Payer: Self-pay | Admitting: *Deleted

## 2017-07-05 ENCOUNTER — Encounter: Payer: Medicaid Other | Admitting: Obstetrics & Gynecology

## 2017-07-05 ENCOUNTER — Encounter (HOSPITAL_COMMUNITY): Payer: Self-pay

## 2017-07-05 ENCOUNTER — Inpatient Hospital Stay (HOSPITAL_COMMUNITY)
Admission: AD | Admit: 2017-07-05 | Discharge: 2017-07-05 | Disposition: A | Discharge status: Home / Self Care | Payer: Medicaid Other | Source: Ambulatory Visit | Attending: Obstetrics & Gynecology | Admitting: Obstetrics & Gynecology

## 2017-07-05 ENCOUNTER — Other Ambulatory Visit: Payer: Medicaid Other

## 2017-07-05 DIAGNOSIS — Z3A38 38 weeks gestation of pregnancy: Secondary | ICD-10-CM | POA: Diagnosis not present

## 2017-07-05 DIAGNOSIS — O368130 Decreased fetal movements, third trimester, not applicable or unspecified: Secondary | ICD-10-CM | POA: Insufficient documentation

## 2017-07-05 DIAGNOSIS — Z833 Family history of diabetes mellitus: Secondary | ICD-10-CM | POA: Insufficient documentation

## 2017-07-05 DIAGNOSIS — Z3689 Encounter for other specified antenatal screening: Secondary | ICD-10-CM

## 2017-07-05 DIAGNOSIS — Z79899 Other long term (current) drug therapy: Secondary | ICD-10-CM | POA: Insufficient documentation

## 2017-07-05 NOTE — MAU Provider Note (Signed)
History     CSN: [id number]  Arrival date and time: 07/05/17 [id number]   First Provider Initiated Contact with Patient 07/05/17 2028      Chief Complaint  Patient presents with  . Decreased Fetal Movement   HPI  [name] is 21 yo G2P1001 at 38.[redacted] wks gestation presenting with complaints of decreased fetal movement x 3 days.  She has tried lying down, eating sugary foods, drinking cold water without improved fetal movement. She was told by her family that she should come to see if she needed to be induced.  Past Medical History:  Diagnosis Date  . Bell's palsy   . Bell's palsy   . Bronchitis   . Chlamydia   . Gonorrhea   . Trichomonas vaginitis   . UTI (urinary tract infection)     Past Surgical History:  Procedure Laterality Date  . WISDOM TOOTH EXTRACTION      Family History  Problem Relation Age of Onset  . Diabetes Maternal Aunt   . Diabetes Maternal Grandmother   . Diabetes Paternal Grandmother     Social History  Substance Use Topics  . Smoking status: Never Smoker  . Smokeless tobacco: Never Used  . Alcohol use No     Comment: occasionally    Allergies: No Known Allergies  Prescriptions Prior to Admission  Medication Sig Dispense Refill Last Dose  . calcium carbonate (TUMS - DOSED IN MG ELEMENTAL CALCIUM) 500 MG chewable tablet Chew 1 tablet by mouth daily as needed for indigestion or heartburn.   Past Month at Unknown time  . cyclobenzaprine (FLEXERIL) 10 MG tablet Take 1 tablet (10 mg total) by mouth 2 (two) times daily as needed for muscle spasms. 40 tablet 0 Past Week at Unknown time  . Prenatal Vit-Fe Fumarate-FA (PRENATAL MULTIVITAMIN) TABS tablet Take 1 tablet by mouth daily at 12 noon.   07/04/2017 at Unknown time    Review of Systems  Constitutional: Negative.   HENT: Negative.   Eyes: Negative.   Respiratory: Negative.   Cardiovascular: Negative.   Gastrointestinal: Negative.   Endocrine: Negative.   Genitourinary:       Decreased FM  x 3 days  Musculoskeletal: Negative.   Skin: Negative.   Allergic/Immunologic: Negative.   Neurological: Negative.   Hematological: Negative.    Physical Exam   Blood pressure 114/67, pulse 92, temperature 98.2 F (36.8 C), temperature source Oral, resp. rate 18, height 5\' 3"  (1.6 m), weight 61.3 kg (135 lb 4 oz), last menstrual period 10/07/2016, SpO2 99 %, unknown if currently breastfeeding.  Physical Exam  Constitutional: She is oriented to person, place, and time. She appears well-developed and well-nourished.  HENT:  Head: Normocephalic.  Eyes: Pupils are equal, round, and reactive to light.  Neck: Normal range of motion.  Cardiovascular: Normal rate, regular rhythm and normal heart sounds.   Respiratory: Effort normal and breath sounds normal.  GI: Soft. Bowel sounds are normal.  Genitourinary:  Genitourinary Comments: Deferred - not feeling UC's  Neurological: She is alert and oriented to person, place, and time. She has normal reflexes.  Skin: Skin is warm and dry.  Psychiatric: She has a normal mood and affect. Her behavior is normal. Judgment and thought content normal.    MAU Course  Procedures  MDM [facility] NST - FHR: 145 bpm / moderate variability / accels present / decels absent TOCO: regular every 4-5 mins -- pt unaware   Assessment and Plan  NST (non-stress test) reactive - Reassurance  given that baby is moving a lot on the monitor and HR tracing is reactive - [facility] instructions given and discussed - Keep scheduled appt on Monday 8/13 with [facility]  Discharge home  Patient verbalized an understanding of the plan of care and agrees.   Raelyn Mora, MSN, CNM 07/05/2017, 8:50 PM

## 2017-07-05 NOTE — MAU Note (Signed)
Pt states she has noticed decreased fetal movement for the last three days. Pt states she has tried lying down and sugary foods without success. Pt denies bleeding. Pt states she is having some irregular contractions.

## 2017-07-05 NOTE — Telephone Encounter (Signed)
Called pt and discussed her missed appt from last week (8/2) and again today.  She stated that her child was sick today and she had to take him to the doctor. I asked if she could come for appt tomorrow and she stated she could not come until Monday (8/13). I asked if her baby is moving well every day and she stated that the baby has slowed down a lot and does not move much. I advised pt that this is not normal and must be evaluated today - it cannot wait until her appt next week. Pt was advised to go to MAU today for evaluation and she agreed. Prior to ending the call, I transferred [facility] to [facility] to schedule appt for early next week.

## 2017-07-09 ENCOUNTER — Encounter: Payer: Medicaid Other | Admitting: Advanced Practice Midwife

## 2017-07-09 ENCOUNTER — Other Ambulatory Visit: Payer: Medicaid Other

## 2017-07-09 ENCOUNTER — Inpatient Hospital Stay (HOSPITAL_COMMUNITY)
Admission: AD | Admit: 2017-07-09 | Discharge: 2017-07-09 | Disposition: A | Discharge status: Home / Self Care | Payer: Medicaid Other | Source: Ambulatory Visit | Attending: Obstetrics & Gynecology | Admitting: Obstetrics & Gynecology

## 2017-07-09 DIAGNOSIS — O479 False labor, unspecified: Secondary | ICD-10-CM

## 2017-07-09 DIAGNOSIS — O471 False labor at or after 37 completed weeks of gestation: Secondary | ICD-10-CM | POA: Insufficient documentation

## 2017-07-09 DIAGNOSIS — Z3A38 38 weeks gestation of pregnancy: Secondary | ICD-10-CM | POA: Diagnosis not present

## 2017-07-09 MED ORDER — PROMETHAZINE HCL 25 MG/ML IJ SOLN: 25.0000 mg | Freq: Once | INTRAMUSCULAR | Status: DC

## 2017-07-09 MED ORDER — LACTATED RINGERS IV BOLUS (SEPSIS): 1000.0000 mL | Freq: Once | INTRAVENOUS | Status: DC

## 2017-07-09 MED ORDER — ACETAMINOPHEN 325 MG PO TABS
650.0000 mg | ORAL_TABLET | ORAL | Status: DC | PRN
  Administered 2017-07-09: 650 mg via ORAL
  Filled 2017-07-09: qty 2

## 2017-07-09 MED ORDER — NALBUPHINE HCL 10 MG/ML IJ SOLN: 10.0000 mg | Freq: Once | INTRAMUSCULAR | Status: DC

## 2017-07-09 NOTE — MAU Note (Signed)
.   I have communicated with Dr. Cliffton AstersWhite and reviewed vital signs:  Vitals:   07/09/17 1120 07/09/17 1238  BP: 106/77 (!) 112/59  Pulse: (!) 111 76  Resp: 18 18  Temp: 98.5 F (36.9 C)     Vaginal exam:  Dilation: 3 Effacement (%): 70 Cervical Position: Middle Station: -3 Presentation: Vertex Exam by:: Khadijatou Borak, RN ,   Also reviewed contraction pattern and that non-stress test is reactive.  It has been documented that patient is contracting every 2-3 minutes with no cervical change over 1 hours not indicating active labor.  Patient denies any other complaints.  Based on this report provider has given order for discharge.  A discharge order and diagnosis entered by a provider.   Labor discharge instructions reviewed with patient.

## 2017-07-09 NOTE — MAU Note (Signed)
Pt C/O contractions & back pain since last night @ 2200, denies bleeding or LOF.   Reports good fetal movement.

## 2017-07-16 ENCOUNTER — Encounter (HOSPITAL_COMMUNITY): Payer: Self-pay | Admitting: *Deleted

## 2017-07-16 ENCOUNTER — Inpatient Hospital Stay (HOSPITAL_COMMUNITY)
Admission: AD | Admit: 2017-07-16 | Discharge: 2017-07-18 | DRG: 775 | Disposition: A | Discharge status: Home / Self Care | Payer: Medicaid Other | Source: Ambulatory Visit | Attending: Obstetrics and Gynecology | Admitting: Obstetrics and Gynecology

## 2017-07-16 ENCOUNTER — Inpatient Hospital Stay (HOSPITAL_COMMUNITY): Payer: Medicaid Other | Admitting: Anesthesiology

## 2017-07-16 ENCOUNTER — Inpatient Hospital Stay (HOSPITAL_COMMUNITY): Payer: Medicaid Other

## 2017-07-16 DIAGNOSIS — O99324 Drug use complicating childbirth: Secondary | ICD-10-CM | POA: Diagnosis present

## 2017-07-16 DIAGNOSIS — F1410 Cocaine abuse, uncomplicated: Secondary | ICD-10-CM | POA: Diagnosis present

## 2017-07-16 DIAGNOSIS — D649 Anemia, unspecified: Secondary | ICD-10-CM | POA: Diagnosis present

## 2017-07-16 DIAGNOSIS — Z87891 Personal history of nicotine dependence: Secondary | ICD-10-CM

## 2017-07-16 DIAGNOSIS — O9902 Anemia complicating childbirth: Secondary | ICD-10-CM | POA: Diagnosis present

## 2017-07-16 DIAGNOSIS — Z3A39 39 weeks gestation of pregnancy: Secondary | ICD-10-CM | POA: Diagnosis not present

## 2017-07-16 DIAGNOSIS — Z0371 Encounter for suspected problem with amniotic cavity and membrane ruled out: Secondary | ICD-10-CM

## 2017-07-16 DIAGNOSIS — F1210 Cannabis abuse, uncomplicated: Secondary | ICD-10-CM | POA: Diagnosis present

## 2017-07-16 DIAGNOSIS — O99320 Drug use complicating pregnancy, unspecified trimester: Secondary | ICD-10-CM

## 2017-07-16 DIAGNOSIS — O288 Other abnormal findings on antenatal screening of mother: Secondary | ICD-10-CM

## 2017-07-16 LAB — RAPID URINE DRUG SCREEN, HOSP PERFORMED
AMPHETAMINES: NOT DETECTED
BARBITURATES: NOT DETECTED
BENZODIAZEPINES: NOT DETECTED
COCAINE: NOT DETECTED
Opiates: NOT DETECTED
TETRAHYDROCANNABINOL: POSITIVE — AB

## 2017-07-16 LAB — CBC
HEMATOCRIT: 34.5 % — AB (ref 36.0–46.0)
Hemoglobin: 11.9 g/dL — ABNORMAL LOW (ref 12.0–15.0)
MCH: 30.1 pg (ref 26.0–34.0)
MCHC: 34.5 g/dL (ref 30.0–36.0)
MCV: 87.1 fL (ref 78.0–100.0)
PLATELETS: 158 10*3/uL (ref 150–400)
RBC: 3.96 MIL/uL (ref 3.87–5.11)
RDW: 14 % (ref 11.5–15.5)
WBC: 7.8 10*3/uL (ref 4.0–10.5)

## 2017-07-16 LAB — TYPE AND SCREEN
ABO/RH(D): A POS
ANTIBODY SCREEN: NEGATIVE

## 2017-07-16 LAB — AMNISURE RUPTURE OF MEMBRANE (ROM) NOT AT ARMC: Amnisure ROM: NEGATIVE

## 2017-07-16 LAB — POCT FERN TEST: POCT Fern Test: NEGATIVE

## 2017-07-16 MED ORDER — OXYTOCIN BOLUS FROM INFUSION
500.0000 mL | Freq: Once | INTRAVENOUS | Status: AC
  Administered 2017-07-16: 500 mL via INTRAVENOUS

## 2017-07-16 MED ORDER — DIPHENHYDRAMINE HCL 25 MG PO CAPS: 25.0000 mg | ORAL_CAPSULE | Freq: Four times a day (QID) | ORAL | Status: DC | PRN

## 2017-07-16 MED ORDER — PHENYLEPHRINE 40 MCG/ML (10ML) SYRINGE FOR IV PUSH (FOR BLOOD PRESSURE SUPPORT)
80.0000 ug | PREFILLED_SYRINGE | INTRAVENOUS | Status: DC | PRN
  Filled 2017-07-16: qty 5
  Filled 2017-07-16: qty 10

## 2017-07-16 MED ORDER — SOD CITRATE-CITRIC ACID 500-334 MG/5ML PO SOLN: 30.0000 mL | ORAL | Status: DC | PRN

## 2017-07-16 MED ORDER — ZOLPIDEM TARTRATE 5 MG PO TABS: 5.0000 mg | ORAL_TABLET | Freq: Every evening | ORAL | Status: DC | PRN

## 2017-07-16 MED ORDER — WITCH HAZEL-GLYCERIN EX PADS: 1.0000 "application " | MEDICATED_PAD | CUTANEOUS | Status: DC | PRN

## 2017-07-16 MED ORDER — TERBUTALINE SULFATE 1 MG/ML IJ SOLN
0.2500 mg | Freq: Once | INTRAMUSCULAR | Status: DC | PRN
Start: 2017-07-16 — End: 2017-07-16
  Filled 2017-07-16: qty 1

## 2017-07-16 MED ORDER — LACTATED RINGERS IV SOLN
500.0000 mL | Freq: Once | INTRAVENOUS | Status: AC
  Administered 2017-07-16: 500 mL via INTRAVENOUS

## 2017-07-16 MED ORDER — EPHEDRINE 5 MG/ML INJ
10.0000 mg | INTRAVENOUS | Status: DC | PRN
  Filled 2017-07-16: qty 2

## 2017-07-16 MED ORDER — BENZOCAINE-MENTHOL 20-0.5 % EX AERO: 1.0000 "application " | INHALATION_SPRAY | CUTANEOUS | Status: DC | PRN

## 2017-07-16 MED ORDER — OXYTOCIN 40 UNITS IN LACTATED RINGERS INFUSION - SIMPLE MED
2.5000 [IU]/h | INTRAVENOUS | Status: DC
  Administered 2017-07-16: 2.5 [IU]/h via INTRAVENOUS

## 2017-07-16 MED ORDER — ACETAMINOPHEN 325 MG PO TABS: 650.0000 mg | ORAL_TABLET | ORAL | Status: DC | PRN

## 2017-07-16 MED ORDER — COCONUT OIL OIL: 1.0000 "application " | TOPICAL_OIL | Status: DC | PRN

## 2017-07-16 MED ORDER — TETANUS-DIPHTH-ACELL PERTUSSIS 5-2.5-18.5 LF-MCG/0.5 IM SUSP: 0.5000 mL | Freq: Once | INTRAMUSCULAR | Status: DC

## 2017-07-16 MED ORDER — DIBUCAINE 1 % RE OINT: 1.0000 "application " | TOPICAL_OINTMENT | RECTAL | Status: DC | PRN

## 2017-07-16 MED ORDER — DIPHENHYDRAMINE HCL 50 MG/ML IJ SOLN: 12.5000 mg | INTRAMUSCULAR | Status: DC | PRN

## 2017-07-16 MED ORDER — ONDANSETRON HCL 4 MG/2ML IJ SOLN: 4.0000 mg | Freq: Four times a day (QID) | INTRAMUSCULAR | Status: DC | PRN

## 2017-07-16 MED ORDER — OXYCODONE-ACETAMINOPHEN 5-325 MG PO TABS: 1.0000 | ORAL_TABLET | ORAL | Status: DC | PRN

## 2017-07-16 MED ORDER — FENTANYL 2.5 MCG/ML BUPIVACAINE 1/10 % EPIDURAL INFUSION (WH - ANES)
14.0000 mL/h | INTRAMUSCULAR | Status: DC | PRN
  Administered 2017-07-16 (×2): 14 mL/h via EPIDURAL
  Filled 2017-07-16 (×2): qty 100

## 2017-07-16 MED ORDER — SOD CITRATE-CITRIC ACID 500-334 MG/5ML PO SOLN
ORAL | Status: AC
  Filled 2017-07-16: qty 15

## 2017-07-16 MED ORDER — FENTANYL CITRATE (PF) 100 MCG/2ML IJ SOLN: 50.0000 ug | INTRAMUSCULAR | Status: DC | PRN

## 2017-07-16 MED ORDER — PHENYLEPHRINE 40 MCG/ML (10ML) SYRINGE FOR IV PUSH (FOR BLOOD PRESSURE SUPPORT)
80.0000 ug | PREFILLED_SYRINGE | INTRAVENOUS | Status: DC | PRN
  Filled 2017-07-16: qty 5

## 2017-07-16 MED ORDER — LACTATED RINGERS IV SOLN: 500.0000 mL | INTRAVENOUS | Status: DC | PRN

## 2017-07-16 MED ORDER — OXYCODONE-ACETAMINOPHEN 5-325 MG PO TABS: 2.0000 | ORAL_TABLET | ORAL | Status: DC | PRN

## 2017-07-16 MED ORDER — FLEET ENEMA 7-19 GM/118ML RE ENEM: 1.0000 | ENEMA | RECTAL | Status: DC | PRN

## 2017-07-16 MED ORDER — LACTATED RINGERS IV SOLN
INTRAVENOUS | Status: DC
  Administered 2017-07-16 (×2): via INTRAVENOUS

## 2017-07-16 MED ORDER — LIDOCAINE HCL (PF) 1 % IJ SOLN
30.0000 mL | INTRAMUSCULAR | Status: DC | PRN
  Filled 2017-07-16: qty 30

## 2017-07-16 MED ORDER — ONDANSETRON HCL 4 MG/2ML IJ SOLN: 4.0000 mg | INTRAMUSCULAR | Status: DC | PRN

## 2017-07-16 MED ORDER — SIMETHICONE 80 MG PO CHEW
80.0000 mg | CHEWABLE_TABLET | ORAL | Status: DC | PRN
  Administered 2017-07-17: 80 mg via ORAL
  Filled 2017-07-16: qty 1

## 2017-07-16 MED ORDER — PRENATAL MULTIVITAMIN CH
1.0000 | ORAL_TABLET | Freq: Every day | ORAL | Status: DC
  Administered 2017-07-17 – 2017-07-18 (×2): 1 via ORAL
  Filled 2017-07-16 (×2): qty 1

## 2017-07-16 MED ORDER — ONDANSETRON HCL 4 MG PO TABS: 4.0000 mg | ORAL_TABLET | ORAL | Status: DC | PRN

## 2017-07-16 MED ORDER — SENNOSIDES-DOCUSATE SODIUM 8.6-50 MG PO TABS
2.0000 | ORAL_TABLET | ORAL | Status: DC
  Administered 2017-07-16 – 2017-07-17 (×2): 2 via ORAL
  Filled 2017-07-16 (×2): qty 2

## 2017-07-16 MED ORDER — IBUPROFEN 600 MG PO TABS
600.0000 mg | ORAL_TABLET | Freq: Four times a day (QID) | ORAL | Status: DC
  Administered 2017-07-16 – 2017-07-18 (×7): 600 mg via ORAL
  Filled 2017-07-16 (×7): qty 1

## 2017-07-16 MED ORDER — LIDOCAINE HCL (PF) 1 % IJ SOLN
INTRAMUSCULAR | Status: DC | PRN
  Administered 2017-07-16: 7 mL via EPIDURAL
  Administered 2017-07-16: 6 mL via EPIDURAL

## 2017-07-16 MED ORDER — OXYTOCIN 40 UNITS IN LACTATED RINGERS INFUSION - SIMPLE MED
1.0000 m[IU]/min | INTRAVENOUS | Status: DC
  Administered 2017-07-16: 8 m[IU]/min via INTRAVENOUS
  Administered 2017-07-16: 2 m[IU]/min via INTRAVENOUS
  Filled 2017-07-16: qty 1000

## 2017-07-16 NOTE — Progress Notes (Signed)
Labor Progress Note  Linda Khan is a 21 y.o. G2P1001 at [redacted]w[redacted]d  admitted for induction of labor due to Non-reactive NST and poor PNC.  S: Doing well. Comfortable with epidural.  O:  BP 115/72   Pulse 71   Temp 98.8 F (37.1 C) (Oral)   Resp 16   Ht 5\' 3"  (1.6 m)   Wt 136 lb (61.7 kg)   LMP 10/07/2016   SpO2 100%   BMI 24.09 kg/m   No intake/output data recorded.  FHT:  FHR: 130 bpm, variability: moderate,  accelerations:  Present,  decelerations:  Absent UC:   regular, every 2-4 minutes SVE:   Dilation: 4 Effacement (%): 80 Station: -2, Ballotable Exam by:: Sharrie Rothman RN and [employer] RN   Pitocin @ 10 mu/min  Labs: Lab Results  Component Value Date   WBC 7.8 07/16/2017   HGB 11.9 (L) 07/16/2017   HCT 34.5 (L) 07/16/2017   MCV 87.1 07/16/2017   PLT 158 07/16/2017    Assessment / Plan: 21 y.o. G2P1001 [redacted]w[redacted]d in latent labor Induction of labor due to non-reassuring fetal testing and poor PNC,  progressing well on pitocin  Labor: Progressing on Pitocin, will continue to increase then AROM Fetal Wellbeing:  Category I Pain Control:  Epidural Anticipated MOD:  NSVD  Expectant management   Caryl Ada, DO OB Fellow Faculty Practice, [facility] 07/16/2017, 10:12 AM

## 2017-07-16 NOTE — Anesthesia Procedure Notes (Signed)
Epidural Patient location during procedure: OB Start time: 07/16/2017 8:08 AM End time: 07/16/2017 8:14 AM  Staffing Anesthesiologist: Leilani Able Performed: anesthesiologist   Preanesthetic Checklist Completed: patient identified, surgical consent, pre-op evaluation, timeout performed, IV checked, risks and benefits discussed and monitors and equipment checked  Epidural Patient position: sitting Prep: site prepped and draped and DuraPrep Patient monitoring: continuous pulse ox and blood pressure Approach: midline Location: L3-L4 Injection technique: LOR air  Needle:  Needle type: Tuohy  Needle gauge: 17 G Needle length: 9 cm and 9 Needle insertion depth: 5 cm cm Catheter type: closed end flexible Catheter size: 19 Gauge Catheter at skin depth: 10 cm Test dose: negative and Other  Assessment Sensory level: T10 Events: blood not aspirated, injection not painful, no injection resistance, negative IV test and no paresthesia  Additional Notes Reason for block:procedure for pain

## 2017-07-16 NOTE — MAU Note (Signed)
Pt states she experience leaking clear fluid off and on since 0700 this morning with irregular mild contractions.  +FM, no vag bleeding.

## 2017-07-16 NOTE — Anesthesia Preprocedure Evaluation (Signed)
Anesthesia Evaluation  Patient identified by MRN, date of birth, ID band Patient awake    Reviewed: Allergy & Precautions, H&P , NPO status , Patient's Chart, lab work & pertinent test results  Airway Mallampati: I  TM Distance: >3 FB Neck ROM: full    Dental no notable dental hx.    Pulmonary neg pulmonary ROS, former smoker,    Pulmonary exam normal breath sounds clear to auscultation       Cardiovascular negative cardio ROS Normal cardiovascular exam Rhythm:regular Rate:Normal     Neuro/Psych negative psych ROS   GI/Hepatic negative GI ROS, Neg liver ROS,   Endo/Other  negative endocrine ROS  Renal/GU negative Renal ROS     Musculoskeletal negative musculoskeletal ROS (+)   Abdominal Normal abdominal exam  (+)   Peds  Hematology  (+) anemia ,   Anesthesia Other Findings   Reproductive/Obstetrics (+) Pregnancy                             Anesthesia Physical Anesthesia Plan  ASA: II  Anesthesia Plan: Epidural   Post-op Pain Management:    Induction:   PONV Risk Score and Plan:   Airway Management Planned:   Additional Equipment:   Intra-op Plan:   Post-operative Plan:   Informed Consent: I have reviewed the patients History and Physical, chart, labs and discussed the procedure including the risks, benefits and alternatives for the proposed anesthesia with the patient or authorized representative who has indicated his/her understanding and acceptance.     Plan Discussed with:   Anesthesia Plan Comments:         Anesthesia Quick Evaluation

## 2017-07-16 NOTE — Progress Notes (Signed)
Labor Progress Note  Linda Khan is a 22 y.o. G2P1001 at [redacted]w[redacted]d  admitted for induction of labor due to Non-reactive NST and poor PNC.  S: Doing well. Comfortable with epidural.  O:  BP (!) 107/53   Pulse 67   Temp 98.8 F (37.1 C) (Oral)   Resp 16   Ht 5\' 3"  (1.6 m)   Wt 61.7 kg (136 lb)   LMP 10/07/2016   SpO2 100%   BMI 24.09 kg/m   No intake/output data recorded.  FHT:  FHR: 130 bpm, variability: moderate,  accelerations:  Present,  decelerations:  Absent UC:   regular, every 2-3 minutes SVE:   Dilation: 4 Effacement (%): 80 Station: -2, Ballotable Exam by:: Sharrie Rothman RN and Herma Carson RN   Pitocin @ 14 mu/min  Labs: Lab Results  Component Value Date   WBC 7.8 07/16/2017   HGB 11.9 (L) 07/16/2017   HCT 34.5 (L) 07/16/2017   MCV 87.1 07/16/2017   PLT 158 07/16/2017    Assessment / Plan: 21 y.o. G2P1001 [redacted]w[redacted]d in latent labor Induction of labor due to non-reassuring fetal testing and poor PNC,  progressing well on pitocin  Labor: Progressing on Pitocin, will continue to increase then AROM once patient is 6 cm dilated Fetal Wellbeing:  Category I Pain Control:  Epidural Anticipated MOD:  NSVD  Expectant management   Lezlie Octave, MD FM Resident, PGY-1 07/16/2017, 11:49 AM

## 2017-07-16 NOTE — Progress Notes (Signed)
Was called to room after patient had SROM with deep prolonged decel down to 60bpm. Concern was for cord prolapse. Checked patient and no concern for cord prolapse. Believe the sudden decompression of the uterus cause decel. Patient was placed in hands and knees, given oxygen, and pitocin cut off. Dr. Alysia Penna also evaluated patient in room. Placed FSE and IUPC. FHT returned back to baseline.  Caryl Ada, DO OB Fellow 07/16/2017, 2:47 PM

## 2017-07-16 NOTE — Anesthesia Pain Management Evaluation Note (Signed)
  CRNA Pain Management Visit Note  Patient: Linda Khan, 21 y.o., female  "Hello I am a member of the anesthesia team at [facility]. We have an anesthesia team available at all times to provide care throughout the hospital, including epidural management and anesthesia for C-section. I don't know your plan for the delivery whether it a natural birth, water birth, IV sedation, nitrous supplementation, doula or epidural, but we want to meet your pain goals."   1.Was your pain managed to your expectations on prior hospitalizations?   Yes   2.What is your expectation for pain management during this hospitalization?     Epidural  3.How can we help you reach that goal? Epidural. L&D RN in room and aware of patient's desire for epidural.  Record the patient's initial score and the patient's pain goal.   Pain: 10--L&D RN in room and aware.  Pain Goal: Patient unable to give a number. The [facility] wants you to be able to say your pain was always managed very well.  Brandin Dilday L 07/16/2017
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
## 2017-07-17 LAB — RPR: RPR: NONREACTIVE

## 2017-07-17 MED ORDER — IBUPROFEN 600 MG PO TABS: 600.0000 mg | ORAL_TABLET | Freq: Four times a day (QID) | ORAL | 0 refills | Status: DC

## 2017-07-17 MED ORDER — TRAMADOL HCL 50 MG PO TABS
50.0000 mg | ORAL_TABLET | Freq: Four times a day (QID) | ORAL | Status: DC | PRN
  Administered 2017-07-17 (×2): 50 mg via ORAL
  Filled 2017-07-17 (×2): qty 1

## 2017-07-17 NOTE — Progress Notes (Signed)
Post Partum Day #1 Subjective: no complaints, up ad lib, voiding, tolerating PO and reports light lochia  Objective: Blood pressure 116/70, pulse (!) 57, temperature 98.4 F (36.9 C), temperature source Oral, resp. rate 18, height 5\' 3"  (1.6 m), weight 61.7 kg (136 lb), last menstrual period 10/07/2016, SpO2 98 %, unknown if currently breastfeeding.  Physical Exam:  General: alert Lochia: appropriate Uterine Fundus: firm and NT at U DVT Evaluation: No evidence of DVT seen on physical exam.   Recent Labs  07/16/17 0415  HGB 11.9*  HCT 34.5*    Assessment/Plan: Plan for discharge tomorrow, Social Work consult and Contraception IUD, bottle   LOS: 1 day   Allie Bossier 07/17/2017, 6:17 AM

## 2017-07-17 NOTE — Discharge Instructions (Signed)

## 2017-07-17 NOTE — Plan of Care (Signed)
Problem: Activity: Goal: Ability to tolerate increased activity will improve Outcome: Completed/Met Date Met: 07/17/17 Pt ambulates without difficulty around room. Tolerates well  Problem: Life Cycle: Goal: Risk for postpartum hemorrhage will decrease Outcome: Progressing Linda Khan has been firm, bleeding WNL, no clots noted.   Problem: Pain Management: Goal: General experience of comfort will improve and pain level will decrease Outcome: Progressing Pt has required additional pain meds for pain control, however, meds given have been effective.  Pt appropriately verbalizes pain management.  Problem: Bowel/Gastric: Goal: Gastrointestinal status will improve Outcome: Completed/Met Date Met: 74/09/92 Pt required mylicon this am & has since had a bowel movement.

## 2017-07-17 NOTE — Progress Notes (Signed)
Called MD to request additional pain meds for pt stated pain level of 7 on 0-10 pain scale.

## 2017-07-17 NOTE — Anesthesia Postprocedure Evaluation (Signed)
Anesthesia Post Note  Patient: Linda Khan  Procedure(s) Performed: * No procedures listed *     Patient location during evaluation: Mother Baby Anesthesia Type: Epidural Level of consciousness: awake, awake and alert, oriented and patient cooperative Pain management: pain level controlled Vital Signs Assessment: post-procedure vital signs reviewed and stable Respiratory status: spontaneous breathing, nonlabored ventilation and respiratory function stable Cardiovascular status: stable Postop Assessment: no headache, no backache, patient able to bend at knees and no signs of nausea or vomiting Anesthetic complications: no    Last Vitals:  Vitals:   07/17/17 0216 07/17/17 0550  BP: 124/66 116/70  Pulse: 60 (!) 57  Resp: 18 18  Temp: 36.7 C 36.9 C  SpO2:      Last Pain:  Vitals:   07/17/17 0550  TempSrc: Oral  PainSc:    Pain Goal:                 [name]
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
## 2017-07-18 NOTE — Progress Notes (Signed)
CSW spoke with [facility] CPS worker, Dorene Sorrow Ufott via telephone.  CPS informed CSW that he will meet with MOB today (07/18/17) around 11am at the hospital to establish a safety disposition plan.  CSW requested CPS to contact CSW to provide an update prior to CPS leaving the hospital.  Blaine Hamper, MSW, LCSW Clinical Social Work [phone]

## 2017-07-18 NOTE — Discharge Summary (Signed)
OB Discharge Summary  Patient Name: Linda Khan DOB: [birth year] MRN: [id number]  Date of admission: 07/16/2017 Delivering MD: Pincus Large   Date of discharge: 07/18/2017  Admitting diagnosis: 39 WEEKS LEAKING FLUID Intrauterine pregnancy: [redacted]w[redacted]d     Secondary diagnosis:Active Problems:   SVD (spontaneous vaginal delivery)  Additional problems: polysubstance abuse, non-reactive NST     Discharge diagnosis: Term Pregnancy Delivered                                                                     Post partum procedures:social services consult, baby discharge on hold as CPS is involved  Augmentation: Pitocin  Complications: None  Hospital course:  Induction of Labor With Vaginal Delivery   21 y.o. yo [id number] at [redacted]w[redacted]d was admitted to the hospital 07/16/2017 for induction of labor.  Indication for induction: non-reactive NST.  Patient had an uncomplicated labor course as follows: Membrane Rupture Time/Date: 2:08 PM ,07/16/2017   Intrapartum Procedures: Episiotomy: None [1]                                         Lacerations:     Patient had delivery of a Viable infant.  Information for the patient's newborn:  [name], [name] [[id number]]  Delivery Method: Vaginal, Spontaneous Delivery (Filed from Delivery Summary)   07/16/2017  Details of delivery can be found in separate delivery note.  Patient had a routine postpartum course. Patient is discharged home 07/18/17.  Physical exam  Vitals:   07/17/17 0550 07/17/17 1102 07/17/17 1837 07/18/17 0600  BP: 116/70 112/61 113/60 124/73  Pulse: (!) 57 (!) 59 65 (!) 54  Resp: 18 16 18 18   Temp: 98.4 F (36.9 C) 98.2 F (36.8 C) 98 F (36.7 C) 98.2 F (36.8 C)  TempSrc: Oral Oral Oral Oral  SpO2:   100% 99%  Weight:      Height:       General: alert Lochia: appropriate Uterine Fundus: firm Incision: N/A DVT Evaluation: No evidence of DVT seen on physical exam. Labs: Lab Results  Component Value Date   WBC 7.8  07/16/2017   HGB 11.9 (L) 07/16/2017   HCT 34.5 (L) 07/16/2017   MCV 87.1 07/16/2017   PLT 158 07/16/2017   CMP Latest Ref Rng & Units 04/20/2017  Glucose 65 - 99 mg/dL 70  BUN 6 - 20 mg/dL 7  Creatinine 8.34 - 1.96 mg/dL 2.22  Sodium 979 - 892 mmol/L 135  Potassium 3.5 - 5.1 mmol/L 4.1  Chloride 101 - 111 mmol/L 106  CO2 22 - 32 mmol/L 15(L)  Calcium 8.9 - 10.3 mg/dL 9.0  Total Protein 6.5 - 8.1 g/dL 6.7  Total Bilirubin 0.3 - 1.2 mg/dL 0.7  Alkaline Phos 38 - 126 U/L 62  AST 15 - 41 U/L 29  ALT 14 - 54 U/L 10(L)    Discharge instruction: per After Visit Summary and "Baby and Me Booklet".  After Visit Meds:    Diet: routine diet  Activity: Advance as tolerated. Pelvic rest for 6 weeks.   Outpatient follow up:4 weeks Follow up Appt:No future appointments. Follow up visit:  No Follow-up on file.  Postpartum contraception: Plans IUD at postpartum visit  Newborn Data: Live born female  Birth Weight: 7 lb 5.1 oz (3320 g) APGAR: 9, 9  Baby Feeding: Bottle Disposition:rooming in   07/18/2017 Allie Bossier, MD

## 2017-07-18 NOTE — Progress Notes (Signed)
Discharge education complete, discharge instructions and follow up appointment discussed. Patient verbalized understanding. 
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
## 2017-07-18 NOTE — Progress Notes (Signed)
CPS reported to CSW that there are no barriers to infant's d/c to MOB and CPS will continue to provided services to family.  CSW updated bedside nurse.  Blaine Hamper, MSW, LCSW Clinical Social Work [phone]

## 2017-07-20 ENCOUNTER — Inpatient Hospital Stay (HOSPITAL_COMMUNITY)
Admission: AD | Admit: 2017-07-20 | Discharge: 2017-07-21 | Disposition: A | Discharge status: Home / Self Care | Payer: Medicaid Other | Source: Ambulatory Visit | Attending: Family Medicine | Admitting: Family Medicine

## 2017-07-20 DIAGNOSIS — Z87891 Personal history of nicotine dependence: Secondary | ICD-10-CM | POA: Insufficient documentation

## 2017-07-20 DIAGNOSIS — J00 Acute nasopharyngitis [common cold]: Secondary | ICD-10-CM | POA: Diagnosis not present

## 2017-07-20 DIAGNOSIS — N61 Mastitis without abscess: Secondary | ICD-10-CM

## 2017-07-20 DIAGNOSIS — R509 Fever, unspecified: Secondary | ICD-10-CM | POA: Diagnosis present

## 2017-07-20 DIAGNOSIS — J069 Acute upper respiratory infection, unspecified: Secondary | ICD-10-CM | POA: Insufficient documentation

## 2017-07-20 DIAGNOSIS — Z79899 Other long term (current) drug therapy: Secondary | ICD-10-CM | POA: Diagnosis not present

## 2017-07-20 NOTE — MAU Note (Signed)
Pt states she has a fever of 101.2 tonight. States she has a sore throat and ear ache. States she has a HA and "cloudy vision", pelvic pain and leg aches. States this all started before she had baby but worse tonight. Had a SVD 07/17/2017. Pt states bleeding is minimal without odor. Pt denies urinary s/s. Does have some breast tenderness. States she stopped breast feeding the day before yesterday. Has not pumped.

## 2017-07-21 ENCOUNTER — Encounter (HOSPITAL_COMMUNITY): Payer: Self-pay

## 2017-07-21 DIAGNOSIS — J00 Acute nasopharyngitis [common cold]: Secondary | ICD-10-CM | POA: Diagnosis not present

## 2017-07-21 DIAGNOSIS — N61 Mastitis without abscess: Secondary | ICD-10-CM

## 2017-07-21 LAB — URINALYSIS, ROUTINE W REFLEX MICROSCOPIC
BILIRUBIN URINE: NEGATIVE
Glucose, UA: NEGATIVE mg/dL
Ketones, ur: NEGATIVE mg/dL
Nitrite: NEGATIVE
PH: 7 (ref 5.0–8.0)
Protein, ur: 30 mg/dL — AB
Specific Gravity, Urine: 1.006 (ref 1.005–1.030)

## 2017-07-21 MED ORDER — CEPHALEXIN 500 MG PO CAPS
500.0000 mg | ORAL_CAPSULE | Freq: Four times a day (QID) | ORAL | 0 refills | Status: DC
Start: 2017-07-21 — End: 2017-10-12

## 2017-07-21 MED ORDER — CEPHALEXIN 500 MG PO CAPS: 500.0000 mg | ORAL_CAPSULE | Freq: Four times a day (QID) | ORAL | Status: DC

## 2017-07-21 NOTE — Discharge Instructions (Signed)
Breastfeeding and Mastitis  Mastitis is inflammation of the breast tissue. It can occur in women who are breastfeeding. This can make breastfeeding painful. Mastitis will sometimes go away on its own, especially if it is not caused by an infection (non-infectious mastitis). Your health care provider will help determine if medical treatment is needed. Treatment may be needed if the condition is caused by a bacterial infection (infectious mastitis).  What are the causes?  This condition is often associated with a blocked milkduct, which can happen when too much milk builds up in the breast. Causes of excess milk in the breast can include:  · Poor latch-on. If your baby is not latched onto the breast properly, he or she may not empty your breast completely while breastfeeding.  · Allowing too much time to pass between feedings.  · Wearing a bra or other clothing that is too tight. This puts extra pressure on the milk ducts so milk does not flow through them as it should.  · Milk remaining in the breast because it is overfilled (engorged).  · Stress and fatigue.    Mastitis can also be caused by a bacterial infection. Bacteria may enter the breast tissue through cuts, cracks, or openings in the skin near the nipple area. Cracks in the skin are often caused when your baby does not latch on properly to the breast.  What are the signs or symptoms?  Symptoms of this condition include:  · Swelling, redness, tenderness, and pain in an area of the breast. This usually affects the upper part of the breast, toward the armpit region. In most cases, it affects only one breast. In some cases, it may occur on both breasts at the same time and affect a larger portion of breast tissue.  · Swelling of the glands under the arm on the same side.  · Fatigue, headache, and flu-like muscle aches.  · Fever.  · Rapid pulse.    Symptoms usually last 2 to 5 days. Breast pain and redness are at their worst on day 2 and day 3, and they usually go  away by day 5. If an infection is left to progress, a collection of pus (abscess) may develop.  How is this diagnosed?  This condition can be diagnosed based on your symptoms and a physical exam. You may also have tests, such as:  · Blood tests to determine if your body is fighting a bacterial infection.  · Mammogram or ultrasound tests to rule out other problems or diseases.  · Fluid tests. If an abscess has developed, the fluid in the abscess may be removed with a needle. The fluid may be analyzed to determine if bacteria are present.  · Breast milk may be cultured and tested for bacteria.    How is this treated?  This condition will sometimes go away on its own. Your health care provider may choose to wait 24 hours after first seeing you to decide whether treatment is needed. If treatment is needed, it may include:  · Strategies to manage breastfeeding. This includes continuing to breastfeed or pump in order to allow adequate milk flow, using breast massage, and applying heat or cold to the affected area.  · Self-care such as rest and increased fluid intake.  · Medicine for pain.  · Antibiotic medicine to treat a bacterial infection. This is usually taken by mouth.  · If an abscess has developed, it may be treated by removing fluid with a needle.      Follow these instructions at home:  Medicines  · Take over-the-counter and prescription medicines only as told by your health care provider.  · If you were prescribed an antibiotic medicine, take it as told by your health care provider. Do not stop taking the antibiotic even if you start to feel better.  General instructions  · Do not wear a tight or underwire bra. Wear a soft, supportive bra.  · Increase your fluid intake, especially if you have a fever.  · Get plenty of rest.  For breastfeeding:  · Continue to empty your breasts as often as possible, either by breastfeeding or using an electric breast pump. This will lower the pressure and the pain that comes with  it. Ask your health care provider if changes need to be made to your breastfeeding or pumping routine.  · Keep your nipples clean and dry.  · During breastfeeding, empty the first breast completely before going to the other breast. If your baby is not emptying your breasts completely, use a breast pump to empty your breasts.  · Use breast massage during feeding or pumping sessions.  · If directed, apply moist heat to the affected area of your breast right before breastfeeding or pumping. Use the heat source that your health care provider recommends.  · If directed, put ice on the affected area of your breast right after breastfeeding or pumping:  ? Put ice in a plastic bag.  ? Place a towel between your skin and the bag.  ? Leave the ice on for 20 minutes.  · If you go back to work, pump your breasts while at work to stay in time with your nursing schedule.  · Do not allow your breasts to become engorged.  Contact a health care provider if:  · You have pus-like discharge from the breast.  · You have a fever.  · Your symptoms do not improve within 2 days of starting treatment.  · Your symptoms return after you have recovered from a breast infection.  Get help right away if:  · Your pain and swelling are getting worse.  · You have pain that is not controlled with medicine.  · You have a red line extending from the breast toward your armpit.  Summary  · Mastitis is inflammation of the breast tissue. It is often caused by a blocked milk duct or bacteria.  · This condition may be treated with hot and cold compresses, medicines, self-care, and certain breastfeeding strategies.  · If you were prescribed an antibiotic medicine, take it as told by your health care provider. Do not stop taking the antibiotic even if you start to feel better.  · Continue to empty your breasts as often as possible either by breastfeeding or using an electric breast pump.  This information is not intended to replace advice given to you by your  health care provider. Make sure you discuss any questions you have with your health care provider.  Document Released: 03/10/2005 Document Revised: 11/14/2016 Document Reviewed: 11/14/2016  Elsevier Interactive Patient Education © 2017 Elsevier Inc.

## 2017-07-21 NOTE — MAU Provider Note (Signed)
Patient Linda Khan is a 21 y.o. G2P2002 At 5 days postpartum with fever and body aches and ear aches and throat ache. Her symptoms began two days ago, when she abruptly stopped breastfeeding.  History     CSN: [id number]  Arrival date and time: 07/20/17 2316   First Provider Initiated Contact with Patient 07/21/17 0009      Chief Complaint  Patient presents with  . Fever   Fever   Associated symptoms include congestion, ear pain and a sore throat.  Fever at home 101.2; her fever started two days ago and includes chills, body aches, congestion and rhinorrhea, all of which started two days ago. She denies nausea/vomiting, but says she feels "all over sick". Patient denies abdominal pain, excessive bleeding or foul-smelling discharge.   OB History    Gravida Para Term Preterm AB Living   2 2 2  0 0 2   SAB TAB Ectopic Multiple Live Births   0 0 0 0 2      Past Medical History:  Diagnosis Date  . Bell's palsy   . Bell's palsy   . Bronchitis   . Chlamydia   . Gonorrhea   . Trichomonas vaginitis   . UTI (urinary tract infection)     Past Surgical History:  Procedure Laterality Date  . WISDOM TOOTH EXTRACTION      Family History  Problem Relation Age of Onset  . Diabetes Maternal Aunt   . Diabetes Maternal Grandmother   . Diabetes Paternal Grandmother     Social History  Substance Use Topics  . Smoking status: Former Smoker    Quit date: 12/16/2016  . Smokeless tobacco: Never Used  . Alcohol use No     Comment: not while preg    Allergies: No Known Allergies  Prescriptions Prior to Admission  Medication Sig Dispense Refill Last Dose  . calcium carbonate (TUMS - DOSED IN MG ELEMENTAL CALCIUM) 500 MG chewable tablet Chew 1 tablet by mouth daily as needed for indigestion or heartburn.   Past Week at Unknown time  . ibuprofen (ADVIL,MOTRIN) 600 MG tablet Take 1 tablet (600 mg total) by mouth every 6 (six) hours. 30 tablet 0   . Prenatal Vit-Fe Fumarate-FA  (PRENATAL MULTIVITAMIN) TABS tablet Take 1 tablet by mouth daily at 12 noon.   07/15/2017 at Unknown time    Review of Systems  Constitutional: Positive for fever.  HENT: Positive for congestion, ear pain, rhinorrhea and sore throat.   Respiratory: Negative.   Cardiovascular: Negative.   Gastrointestinal: Negative.   Musculoskeletal: Positive for back pain.       Upper back pain, no lower back pain.   Psychiatric/Behavioral: Negative.    Physical Exam   Blood pressure 130/76, pulse 84, temperature 99.7 F (37.6 C), temperature source Oral, resp. rate 19, SpO2 99 %, unknown if currently breastfeeding.  Physical Exam  Constitutional: She is oriented to person, place, and time. She appears well-developed.  HENT:  Head: Normocephalic.  TM clear bilaterally with pearly gray color; no ear drainage or discharge. Nares slightly pink and moist; throat pale pink with no lesion or discharge.   Eyes: Pupils are equal, round, and reactive to light.  Neck: Normal range of motion.  Respiratory: Effort normal. Right breast exhibits nipple discharge and tenderness. Left breast exhibits nipple discharge and tenderness.  Patient's breast are full, tender to the touch and warm. No wedge-shaped areas of tenderness or signs of abscesses.   GI: Soft. She exhibits no distension  and no mass. There is no tenderness. There is no rebound and no guarding.  Musculoskeletal: Normal range of motion.  Neurological: She is alert and oriented to person, place, and time. She has normal reflexes.  Skin: Skin is warm and dry.  Psychiatric: She has a normal mood and affect.    MAU Course  Procedures  MDM Two temps taken in MAU were normal (99.1 and 99.6), both axillary.  Patient used the hospital-grade pump while in MAU to relieve engorgement. Patient is very adamant that she wants to stop breastfeeding; I explained the best way to gently wean her baby to prevent engorgement and mastitis. Patient plans to purchase a  breast pump to help with weaning and possibly attempt to bottle-feed her son breast milk.    Assessment and Plan   1. Mastitis   2. Acute nasopharyngitis    2. Patient stable for discharge with RX for Keflex, information on mastitis and comfort measures for URI.  3. Return precautions given.  4. All questions answered.   Charlesetta Garibaldi Surabhi Gadea 07/21/2017, 12:16 AM

## 2017-08-07 ENCOUNTER — Encounter: Payer: Self-pay | Admitting: *Deleted

## 2017-08-20 ENCOUNTER — Ambulatory Visit: Payer: Medicaid Other | Admitting: Certified Nurse Midwife

## 2017-09-14 ENCOUNTER — Ambulatory Visit: Payer: Medicaid Other | Admitting: Medical

## 2017-10-12 ENCOUNTER — Emergency Department (HOSPITAL_COMMUNITY)
Admission: EM | Admit: 2017-10-12 | Discharge: 2017-10-12 | Disposition: A | Discharge status: Home / Self Care | Payer: Medicaid Other | Attending: Emergency Medicine | Admitting: Emergency Medicine

## 2017-10-12 ENCOUNTER — Emergency Department (HOSPITAL_COMMUNITY): Payer: Medicaid Other

## 2017-10-12 ENCOUNTER — Encounter (HOSPITAL_COMMUNITY): Payer: Self-pay | Admitting: Nurse Practitioner

## 2017-10-12 DIAGNOSIS — Z87891 Personal history of nicotine dependence: Secondary | ICD-10-CM | POA: Insufficient documentation

## 2017-10-12 DIAGNOSIS — N12 Tubulo-interstitial nephritis, not specified as acute or chronic: Secondary | ICD-10-CM | POA: Insufficient documentation

## 2017-10-12 DIAGNOSIS — Z79899 Other long term (current) drug therapy: Secondary | ICD-10-CM | POA: Diagnosis not present

## 2017-10-12 DIAGNOSIS — R300 Dysuria: Secondary | ICD-10-CM | POA: Diagnosis present

## 2017-10-12 DIAGNOSIS — R6889 Other general symptoms and signs: Secondary | ICD-10-CM | POA: Diagnosis not present

## 2017-10-12 LAB — URINALYSIS, ROUTINE W REFLEX MICROSCOPIC
Bilirubin Urine: NEGATIVE
Glucose, UA: NEGATIVE mg/dL
Ketones, ur: 5 mg/dL — AB
Nitrite: NEGATIVE
PROTEIN: 100 mg/dL — AB
SPECIFIC GRAVITY, URINE: 1.016 (ref 1.005–1.030)
pH: 5 (ref 5.0–8.0)

## 2017-10-12 LAB — RAPID STREP SCREEN (MED CTR MEBANE ONLY): STREPTOCOCCUS, GROUP A SCREEN (DIRECT): NEGATIVE

## 2017-10-12 MED ORDER — CIPROFLOXACIN HCL 500 MG PO TABS: 500.0000 mg | ORAL_TABLET | Freq: Two times a day (BID) | ORAL | 0 refills | Status: DC

## 2017-10-12 MED ORDER — SODIUM CHLORIDE 0.9 % IV BOLUS (SEPSIS)
1000.0000 mL | Freq: Once | INTRAVENOUS | Status: AC
  Administered 2017-10-12: 1000 mL via INTRAVENOUS

## 2017-10-12 MED ORDER — IBUPROFEN 800 MG PO TABS
800.0000 mg | ORAL_TABLET | Freq: Once | ORAL | Status: AC
  Administered 2017-10-12: 800 mg via ORAL
  Filled 2017-10-12: qty 1

## 2017-10-12 MED ORDER — DEXTROSE 5 % IV SOLN
1.0000 g | Freq: Once | INTRAVENOUS | Status: AC
  Administered 2017-10-12: 1 g via INTRAVENOUS
  Filled 2017-10-12: qty 10

## 2017-10-12 MED ORDER — ONDANSETRON 4 MG PO TBDP: 4.0000 mg | ORAL_TABLET | Freq: Three times a day (TID) | ORAL | 0 refills | Status: DC | PRN

## 2017-10-12 MED ORDER — ACETAMINOPHEN 325 MG PO TABS
650.0000 mg | ORAL_TABLET | Freq: Once | ORAL | Status: DC | PRN
  Filled 2017-10-12: qty 2
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
## 2017-10-12 NOTE — ED Triage Notes (Signed)
Pt is c/o generalized body aches and dysuria. States symptoms started yesterday.Requeting to be evaluated for UTI.

## 2017-10-12 NOTE — Discharge Instructions (Signed)
Please read and follow all provided instructions.  Your diagnoses today include:  1. Pyelonephritis   2. Flu-like symptoms    Tests performed today include:  Urine test - concerning for infection  Strep test - negative  Vital signs. See below for your results today.   Medications prescribed:   Ciprofloxacin - antibiotic  You have been prescribed an antibiotic medicine: take the entire course of medicine even if you are feeling better. Stopping early can cause the antibiotic not to work.   Zofran (ondansetron) - for nausea and vomiting  Take any prescribed medications only as directed.  Home care instructions:  Follow any educational materials contained in this packet. Please continue drinking plenty of fluids. Use over-the-counter cold and flu medications as needed as directed on packaging for symptom relief. You may also use ibuprofen or tylenol as directed on packaging for pain or fever.   BE VERY CAREFUL not to take multiple medicines containing Tylenol (also called acetaminophen). Doing so can lead to an overdose which can damage your liver and cause liver failure and possibly death.   Follow-up instructions: Please follow-up with your primary care provider in the next 3 days for further evaluation of your symptoms.   Return instructions:   Please return to the Emergency Department if you experience worsening symptoms.  Please return if you have a high fever greater than 101 degrees not controlled with over-the-counter medications, persistent vomiting and cannot keep down fluids, or worsening trouble breathing.  Please return if you have any other emergent concerns.  Additional Information:  Your vital signs today were: BP 107/69    Pulse 88    Temp (!) 103.1 F (39.5 C) (Oral)    Resp 18    Wt 53.8 kg (118 lb 11.2 oz)    LMP 09/26/2017    SpO2 100%    BMI 21.03 kg/m  If your blood pressure (BP) was elevated above 135/85 this visit, please have this repeated by your  doctor within one month.

## 2017-10-14 LAB — CULTURE, GROUP A STREP (THRC)

## 2017-10-15 ENCOUNTER — Telehealth: Payer: Self-pay | Admitting: Emergency Medicine

## 2017-10-15 NOTE — Telephone Encounter (Signed)
Post ED Visit - Positive Culture Follow-up: Successful Patient Follow-Up  Culture assessed and recommendations reviewed by: []  Enzo BiNathan Batchelder, Pharm.D. []  Celedonio MiyamotoJeremy Frens, Pharm.D., BCPS AQ-ID []  Garvin FilaMike Maccia, Pharm.D., BCPS []  Georgina PillionElizabeth Martin, Pharm.D., BCPS []  AtticaMinh Pham, VermontPharm.D., BCPS, AAHIVP []  Estella HuskMichelle Turner, Pharm.D., BCPS, AAHIVP []  Lysle Pearlachel Rumbarger, PharmD, BCPS []  Casilda Carlsaylor Stone, PharmD, BCPS []  Pollyann SamplesAndy Johnston, PharmD, BCPS  Positive strep culture  [x]  Patient discharged without antimicrobial prescription and treatment is now indicated []  Organism is resistant to prescribed ED discharge antimicrobial []  Patient with positive blood cultures  Changes discussed with ED provider: Lyndel SafeElizabeth Hammond PA New antibiotic prescription continue cipro for pyelo, start Amoxicillin 500mg  po bid x 10 days  [facility] @ [phone]  Contacted patient, 10/15/2017 1316   Berle MullMiller, Bryannah Boston 10/15/2017, 1:14 PM

## 2017-10-15 NOTE — Progress Notes (Signed)
ED Antimicrobial Stewardship Positive Culture Follow Up   Linda Khan is an 21 y.o. female [facility] with a chief complaint of  Chief Complaint  Patient presents with  . Dysuria  . Generalized Body Aches    Recent Results (from the past 720 hour(s))  Rapid Strep Screen (Not at [facility])     Status: None   Collection Time: 10/12/17  6:19 PM  Result Value Ref Range Status   Streptococcus, Group A Screen (Direct) NEGATIVE NEGATIVE Final    Comment: (NOTE) A Rapid Antigen test may result negative if the antigen level in the sample is below the detection level of this test. The FDA has not cleared this test as a stand-alone test therefore the rapid antigen negative result has reflexed to a Group A Strep culture.   Culture, group A strep     Status: None   Collection Time: 10/12/17  6:19 PM  Result Value Ref Range Status   Specimen Description THROAT  Final   Special Requests NONE Reflexed from [id number]  Final   Culture RARE GROUP A STREP (S.PYOGENES) ISOLATED  Final   Report Status 10/14/2017 FINAL  Final    [x]  Treated with ciprofloxacin, organism resistant to prescribed antimicrobial  Patient diagnosed with pyelonephritis and flu-like symptoms. No influenza screen or urine culture. Group A strep screen now positive, which is not reliably sensitive to cipro.   New antibiotic prescription: Finish 7-day course of cipro for pyelonephritis treatment. Add amoxicillin 500 mg PO BID x 10 days for strep coverage.  ED Provider: Lovey NewcomerElizabeth Hammond   Linda Khan, PharmD PGY1 Pharmacy Resident ID pharmacist phone # [phone] 10/15/17 9:47 AM
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
## 2018-03-21 ENCOUNTER — Inpatient Hospital Stay (HOSPITAL_COMMUNITY)
Admission: AD | Admit: 2018-03-21 | Discharge: 2018-03-21 | Disposition: A | Discharge status: Home / Self Care | Payer: Medicaid Other | Source: Ambulatory Visit | Attending: Obstetrics & Gynecology | Admitting: Obstetrics & Gynecology

## 2018-03-21 ENCOUNTER — Encounter (HOSPITAL_COMMUNITY): Payer: Self-pay | Admitting: *Deleted

## 2018-03-21 DIAGNOSIS — B9689 Other specified bacterial agents as the cause of diseases classified elsewhere: Secondary | ICD-10-CM

## 2018-03-21 DIAGNOSIS — Z87891 Personal history of nicotine dependence: Secondary | ICD-10-CM | POA: Diagnosis not present

## 2018-03-21 DIAGNOSIS — N911 Secondary amenorrhea: Secondary | ICD-10-CM | POA: Insufficient documentation

## 2018-03-21 DIAGNOSIS — N898 Other specified noninflammatory disorders of vagina: Secondary | ICD-10-CM | POA: Diagnosis present

## 2018-03-21 DIAGNOSIS — N760 Acute vaginitis: Secondary | ICD-10-CM | POA: Diagnosis not present

## 2018-03-21 LAB — WET PREP, GENITAL
SPERM: NONE SEEN
Trich, Wet Prep: NONE SEEN
YEAST WET PREP: NONE SEEN

## 2018-03-21 LAB — URINALYSIS, ROUTINE W REFLEX MICROSCOPIC
BACTERIA UA: NONE SEEN
BILIRUBIN URINE: NEGATIVE
Glucose, UA: NEGATIVE mg/dL
Ketones, ur: NEGATIVE mg/dL
Leukocytes, UA: NEGATIVE
NITRITE: NEGATIVE
Protein, ur: NEGATIVE mg/dL
SPECIFIC GRAVITY, URINE: 1.023 (ref 1.005–1.030)
pH: 6 (ref 5.0–8.0)

## 2018-03-21 LAB — POCT PREGNANCY, URINE: PREG TEST UR: NEGATIVE

## 2018-03-21 MED ORDER — METRONIDAZOLE 500 MG PO TABS: 500.0000 mg | ORAL_TABLET | Freq: Two times a day (BID) | ORAL | 0 refills | Status: DC

## 2018-03-21 NOTE — Discharge Instructions (Signed)
In late 2019, the [facility] will be moving to the [place] campus. At that time, the MAU (Maternity Admissions Unit), where you are being seen today, will no longer take care of non-pregnant patients. We strongly encourage you to find a doctor's office before that time, so that you can be seen with any GYN concerns, like vaginal discharge, urinary tract infection, etc.. in a timely manner. ° °In order to make an office visit more convenient, the [facility] will be offering evening hours with same-day appointments, walk-in appointments and scheduled appointments available during this time. ° °[facility] Hours: °Monday - 8am - 7:30 pm with walk-in between 4pm- 7:30 pm °Tuesday - 8 am - 5 pm (starting 02/26/18 we will be open late and accepting walk-ins from 4pm - 7:30pm) °Wednesday - 8 am - 5 pm (starting 05/29/18 we will be open late and accepting walk-ins from 4pm - 7:30pm) °Thursday 8 am - 5 pm (starting 08/29/18 we will be open late and accepting walk-ins from 4pm - 7:30pm) °Friday 8 am - 5 pm ° °For an appointment please call the [facility] at [phone] ° °For urgent needs, [facility] is also available for management of urgent GYN complaints such as vaginal discharge or urinary tract infections. ° ° ° ° ° ° °Bacterial Vaginosis °Bacterial vaginosis is a vaginal infection that occurs when the normal balance of bacteria in the vagina is disrupted. It results from an overgrowth of certain bacteria. This is the most common vaginal infection among women ages 15-44. °Because bacterial vaginosis increases your risk for STIs (sexually transmitted infections), getting treated can help reduce your risk for chlamydia, gonorrhea, herpes, and HIV (human immunodeficiency virus). Treatment is also important for preventing complications in pregnant women, because this condition can cause an early  (premature) delivery. °What are the causes? °This condition is caused by an increase in harmful bacteria that are normally present in small amounts in the vagina. However, the reason that the condition develops is not fully understood. °What increases the risk? °The following factors may make you more likely to develop this condition: °· Having a new sexual partner or multiple sexual partners. °· Having unprotected sex. °· Douching. °· Having an intrauterine device (IUD). °· Smoking. °· Drug and alcohol abuse. °· Taking certain antibiotic medicines. °· Being pregnant. ° °You cannot get bacterial vaginosis from toilet seats, bedding, swimming pools, or contact with objects around you. °What are the signs or symptoms? °Symptoms of this condition include: °· Grey or white vaginal discharge. The discharge can also be watery or foamy. °· A fish-like odor with discharge, especially after sexual intercourse or during menstruation. °· Itching in and around the vagina. °· Burning or pain with urination. ° °Some women with bacterial vaginosis have no signs or symptoms. °How is this diagnosed? °This condition is diagnosed based on: °· Your medical history. °· A physical exam of the vagina. °· Testing a sample of vaginal fluid under a microscope to look for a large amount of bad bacteria or abnormal cells. Your health care provider may use a cotton swab or a small wooden spatula to collect the sample. ° °How is this treated? °This condition is treated with antibiotics. These may be given as a pill, a vaginal cream, or a medicine that is put into the vagina (suppository). If the condition comes back after treatment, a second round of antibiotics may   be needed. °Follow these instructions at home: °Medicines °· Take over-the-counter and prescription medicines only as told by your health care provider. °· Take or use your antibiotic as told by your health care provider. Do not stop taking or using the antibiotic even if you start  to feel better. °General instructions °· If you have a female sexual partner, tell her that you have a vaginal infection. She should see her health care provider and be treated if she has symptoms. If you have a female sexual partner, he does not need treatment. °· During treatment: °? Avoid sexual activity until you finish treatment. °? Do not douche. °? Avoid alcohol as directed by your health care provider. °? Avoid breastfeeding as directed by your health care provider. °· Drink enough water and fluids to keep your urine clear or pale yellow. °· Keep the area around your vagina and rectum clean. °? Wash the area daily with warm water. °? Wipe yourself from front to back after using the toilet. °· Keep all follow-up visits as told by your health care provider. This is important. °How is this prevented? °· Do not douche. °· Wash the outside of your vagina with warm water only. °· Use protection when having sex. This includes latex condoms and dental dams. °· Limit how many sexual partners you have. To help prevent bacterial vaginosis, it is best to have sex with just one partner (monogamous). °· Make sure you and your sexual partner are tested for STIs. °· Wear cotton or cotton-lined underwear. °· Avoid wearing tight pants and pantyhose, especially during summer. °· Limit the amount of alcohol that you drink. °· Do not use any products that contain nicotine or tobacco, such as cigarettes and e-cigarettes. If you need help quitting, ask your health care provider. °· Do not use illegal drugs. °Where to find more information: °· Centers for Disease Control and Prevention: www.cdc.gov/std °· American Sexual Health Association (ASHA): www.ashastd.org °· U.S. Department of Health and Human Services, Office on Women's Health: www.womenshealth.gov/ or https://www.womenshealth.gov/a-z-topics/bacterial-vaginosis °Contact a health care provider if: °· Your symptoms do not improve, even after treatment. °· You have more  discharge or pain when urinating. °· You have a fever. °· You have pain in your abdomen. °· You have pain during sex. °· You have vaginal bleeding between periods. °Summary °· Bacterial vaginosis is a vaginal infection that occurs when the normal balance of bacteria in the vagina is disrupted. °· Because bacterial vaginosis increases your risk for STIs (sexually transmitted infections), getting treated can help reduce your risk for chlamydia, gonorrhea, herpes, and HIV (human immunodeficiency virus). Treatment is also important for preventing complications in pregnant women, because the condition can cause an early (premature) delivery. °· This condition is treated with antibiotic medicines. These may be given as a pill, a vaginal cream, or a medicine that is put into the vagina (suppository). °This information is not intended to replace advice given to you by your health care provider. Make sure you discuss any questions you have with your health care provider. °Document Released: 11/13/2005 Document Revised: 03/19/2017 Document Reviewed: 07/29/2016 °Elsevier Interactive Patient Education © 2018 [employer] Inc. ° °

## 2018-03-21 NOTE — MAU Note (Signed)
Pt reports she has a orange/pink discharge started today. Had a sharp pain before she noticed discharge but no pain now. Pt had IUd placed 5 months ago. Had periods first 2 months but none since.

## 2018-03-21 NOTE — MAU Provider Note (Signed)
History     CSN: [id number]  Arrival date and time: 03/21/18 1052   First Provider Initiated Contact with Patient 03/21/18 1157     Chief Complaint  Patient presents with  . Vaginal Discharge   HPI Linda Khan is a 22 y.o. [id number] non pregnant female who presents with a vaginal discharge. She states it is "orangish and pinkish" in color and has no odor. She denies any pain. She reports no new partners and no concern for any sexually transmitted infections. She does not have a gyn that she sees. She states she has a Mirena IUD and has not had a period in 2 months.   OB History    Gravida  2   Para  2   Term  2   Preterm  0   AB  0   Living  2     SAB  0   TAB  0   Ectopic  0   Multiple  0   Live Births  2           Past Medical History:  Diagnosis Date  . Bell's palsy   . Bell's palsy   . Bronchitis   . Chlamydia   . Gonorrhea   . Trichomonas vaginitis   . UTI (urinary tract infection)     Past Surgical History:  Procedure Laterality Date  . WISDOM TOOTH EXTRACTION      Family History  Problem Relation Age of Onset  . Diabetes Maternal Aunt   . Diabetes Maternal Grandmother   . Diabetes Paternal Grandmother     Social History   Tobacco Use  . Smoking status: Former Smoker    Packs/day: 0.25    Types: Cigarettes    Last attempt to quit: 12/16/2016    Years since quitting: 1.2  . Smokeless tobacco: Never Used  Substance Use Topics  . Alcohol use: Yes    Comment: occasional  . Drug use: Yes    Types: Marijuana    Comment: daily    Allergies: No Known Allergies  Medications Prior to Admission  Medication Sig Dispense Refill Last Dose  . ciprofloxacin (CIPRO) 500 MG tablet Take 1 tablet (500 mg total) 2 (two) times daily by mouth. 14 tablet 0   . ondansetron (ZOFRAN ODT) 4 MG disintegrating tablet Take 1 tablet (4 mg total) every 8 (eight) hours as needed by mouth for nausea or vomiting. 10 tablet 0     Review of Systems   Constitutional: Negative.  Negative for fatigue and fever.  HENT: Negative.   Respiratory: Negative.  Negative for shortness of breath.   Cardiovascular: Negative.  Negative for chest pain.  Gastrointestinal: Negative.  Negative for abdominal pain, constipation, diarrhea, nausea and vomiting.  Genitourinary: Positive for vaginal discharge. Negative for dysuria.  Neurological: Negative.  Negative for dizziness and headaches.   Physical Exam   Blood pressure 106/66, pulse 78, temperature 98.6 F (37 C), resp. rate 18, height 5\' 4"  (1.626 m), weight 113 lb (51.3 kg), unknown if currently breastfeeding.  Physical Exam  Nursing note and vitals reviewed. Constitutional: She is oriented to person, place, and time. She appears well-developed and well-nourished. No distress.  HENT:  Head: Normocephalic.  Eyes: Pupils are equal, round, and reactive to light.  Cardiovascular: Normal rate, regular rhythm and normal heart sounds.  Respiratory: Effort normal and breath sounds normal. No respiratory distress.  GI: Soft. Bowel sounds are normal. She exhibits no distension. There is no tenderness.  Genitourinary:  Genitourinary Comments: Small amount of bright red vaginal bleeding.  Neurological: She is alert and oriented to person, place, and time.  Skin: Skin is warm and dry.  Psychiatric: She has a normal mood and affect. Her behavior is normal. Judgment and thought content normal.    MAU Course  Procedures Results for orders placed or performed during the hospital encounter of 03/21/18 (from the past 24 hour(s))  Urinalysis, Routine w reflex microscopic     Status: Abnormal   Collection Time: 03/21/18 11:10 AM  Result Value Ref Range   Color, Urine YELLOW YELLOW   APPearance CLEAR CLEAR   Specific Gravity, Urine 1.023 1.005 - 1.030   pH 6.0 5.0 - 8.0   Glucose, UA NEGATIVE NEGATIVE mg/dL   Hgb urine dipstick SMALL (A) NEGATIVE   Bilirubin Urine NEGATIVE NEGATIVE   Ketones, ur NEGATIVE  NEGATIVE mg/dL   Protein, ur NEGATIVE NEGATIVE mg/dL   Nitrite NEGATIVE NEGATIVE   Leukocytes, UA NEGATIVE NEGATIVE   RBC / HPF 0-5 0 - 5 RBC/hpf   WBC, UA 0-5 0 - 5 WBC/hpf   Bacteria, UA NONE SEEN NONE SEEN   Squamous Epithelial / LPF 0-5 0 - 5   Mucus PRESENT   Pregnancy, urine POC     Status: None   Collection Time: 03/21/18 11:38 AM  Result Value Ref Range   Preg Test, Ur NEGATIVE NEGATIVE  Wet prep, genital     Status: Abnormal   Collection Time: 03/21/18 12:00 PM  Result Value Ref Range   Yeast Wet Prep HPF POC NONE SEEN NONE SEEN   Trich, Wet Prep NONE SEEN NONE SEEN   Clue Cells Wet Prep HPF POC PRESENT (A) NONE SEEN   WBC, Wet Prep HPF POC FEW (A) NONE SEEN   Sperm NONE SEEN    MDM UA, UPT Wet prep and gc/chlamydia  Assessment and Plan   1. Bacterial vaginosis   2. Secondary amenorrhea    -Discharge home in stable condition -Rx for metronidazole sent to patient's pharmacy. No alcohol reviewed.  -Normalcy of irregular periods after IUD placement reviewed with patient -Patient advised to follow-up with gyn provider of choice for routine care. List given -Patient may return to MAU as needed or if her condition were to change or worsen  Rolm Bookbinder CNM 03/21/2018, 11:57 AM

## 2018-03-22 LAB — GC/CHLAMYDIA PROBE AMP (~~LOC~~) NOT AT ARMC
CHLAMYDIA, DNA PROBE: NEGATIVE
NEISSERIA GONORRHEA: NEGATIVE

## 2018-07-12 ENCOUNTER — Inpatient Hospital Stay (HOSPITAL_COMMUNITY)
Admission: AD | Admit: 2018-07-12 | Discharge: 2018-07-12 | Disposition: A | Discharge status: Home / Self Care | Payer: Medicaid Other | Source: Ambulatory Visit | Attending: Obstetrics & Gynecology | Admitting: Obstetrics & Gynecology

## 2018-07-12 ENCOUNTER — Encounter (HOSPITAL_COMMUNITY): Payer: Self-pay

## 2018-07-12 DIAGNOSIS — A6009 Herpesviral infection of other urogenital tract: Secondary | ICD-10-CM

## 2018-07-12 DIAGNOSIS — R102 Pelvic and perineal pain: Secondary | ICD-10-CM | POA: Diagnosis not present

## 2018-07-12 DIAGNOSIS — Z87891 Personal history of nicotine dependence: Secondary | ICD-10-CM | POA: Insufficient documentation

## 2018-07-12 DIAGNOSIS — A6000 Herpesviral infection of urogenital system, unspecified: Secondary | ICD-10-CM | POA: Insufficient documentation

## 2018-07-12 LAB — WET PREP, GENITAL
Sperm: NONE SEEN
Trich, Wet Prep: NONE SEEN
Yeast Wet Prep HPF POC: NONE SEEN

## 2018-07-12 LAB — HIV ANTIBODY (ROUTINE TESTING W REFLEX): HIV Screen 4th Generation wRfx: NONREACTIVE

## 2018-07-12 LAB — RPR: RPR Ser Ql: NONREACTIVE

## 2018-07-12 LAB — GC/CHLAMYDIA PROBE AMP (~~LOC~~) NOT AT ARMC
CHLAMYDIA, DNA PROBE: NEGATIVE
NEISSERIA GONORRHEA: NEGATIVE

## 2018-07-12 LAB — POCT PREGNANCY, URINE: PREG TEST UR: NEGATIVE

## 2018-07-12 LAB — HEPATITIS B SURFACE ANTIGEN: Hepatitis B Surface Ag: NEGATIVE

## 2018-07-12 MED ORDER — LIDOCAINE HCL URETHRAL/MUCOSAL 2 % EX GEL
1.0000 "application " | Freq: Once | CUTANEOUS | Status: AC
  Administered 2018-07-12: 1 via TOPICAL
  Filled 2018-07-12: qty 5

## 2018-07-12 MED ORDER — ACYCLOVIR 400 MG PO TABS: 400.0000 mg | ORAL_TABLET | Freq: Three times a day (TID) | ORAL | 3 refills | Status: AC

## 2018-07-12 MED ORDER — CEFTRIAXONE SODIUM 250 MG IJ SOLR
250.0000 mg | Freq: Once | INTRAMUSCULAR | Status: AC
  Administered 2018-07-12: 250 mg via INTRAMUSCULAR
  Filled 2018-07-12: qty 250

## 2018-07-12 MED ORDER — AZITHROMYCIN 250 MG PO TABS
1000.0000 mg | ORAL_TABLET | Freq: Once | ORAL | Status: AC
  Administered 2018-07-12: 1000 mg via ORAL
  Filled 2018-07-12: qty 4

## 2018-07-12 NOTE — Discharge Instructions (Signed)
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
## 2018-07-12 NOTE — MAU Note (Signed)
Pt c/o a reddish, white vaginal bump that is painful. States she first noticed it yesterday and that it looks like a pimple.

## 2018-07-13 LAB — HEPATITIS C ANTIBODY: HCV Ab: 0.1 s/co ratio (ref 0.0–0.9)

## 2018-09-04 ENCOUNTER — Emergency Department (HOSPITAL_COMMUNITY)
Admission: EM | Admit: 2018-09-04 | Discharge: 2018-09-04 | Disposition: A | Discharge status: Home / Self Care | Payer: Medicaid Other | Attending: Emergency Medicine | Admitting: Emergency Medicine

## 2018-09-04 ENCOUNTER — Other Ambulatory Visit: Payer: Self-pay

## 2018-09-04 ENCOUNTER — Encounter (HOSPITAL_COMMUNITY): Payer: Self-pay | Admitting: Emergency Medicine

## 2018-09-04 DIAGNOSIS — Z87891 Personal history of nicotine dependence: Secondary | ICD-10-CM | POA: Insufficient documentation

## 2018-09-04 DIAGNOSIS — K649 Unspecified hemorrhoids: Secondary | ICD-10-CM | POA: Insufficient documentation

## 2018-09-04 DIAGNOSIS — F1210 Cannabis abuse, uncomplicated: Secondary | ICD-10-CM | POA: Diagnosis not present

## 2018-09-04 DIAGNOSIS — K625 Hemorrhage of anus and rectum: Secondary | ICD-10-CM | POA: Diagnosis not present

## 2018-09-04 MED ORDER — DOCUSATE SODIUM 100 MG PO CAPS: 100.0000 mg | ORAL_CAPSULE | Freq: Two times a day (BID) | ORAL | 0 refills | Status: DC

## 2018-09-04 NOTE — Discharge Instructions (Signed)
Please return for any problem. Follow up with your regular physician as instructed.  °
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
## 2018-09-04 NOTE — ED Triage Notes (Signed)
Pt complaint of "blood dripping" after straining; hx of hemorrhoids. "I think something may have bust."

## 2018-09-11 ENCOUNTER — Ambulatory Visit (HOSPITAL_COMMUNITY)
Admission: EM | Admit: 2018-09-11 | Discharge: 2018-09-11 | Disposition: A | Discharge status: Home / Self Care | Payer: Medicaid Other | Attending: Family Medicine | Admitting: Family Medicine

## 2018-09-11 ENCOUNTER — Encounter (HOSPITAL_COMMUNITY): Payer: Self-pay | Admitting: Emergency Medicine

## 2018-09-11 DIAGNOSIS — R0789 Other chest pain: Secondary | ICD-10-CM

## 2018-09-11 MED ORDER — MELOXICAM 7.5 MG PO TABS: 7.5000 mg | ORAL_TABLET | Freq: Every day | ORAL | 0 refills | Status: DC

## 2018-09-11 NOTE — ED Triage Notes (Signed)
Pt c/o chest wall tenderness, painful with palpation, painful with laughing, painful to bend over.
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
## 2018-09-11 NOTE — Discharge Instructions (Addendum)
Start Mobic. Do not take ibuprofen (motrin/advil)/ naproxen (aleve) while on mobic. Ice compress can also help with the symptoms. Follow up for reevaluation as needed.

## 2018-10-10 ENCOUNTER — Inpatient Hospital Stay (HOSPITAL_COMMUNITY)
Admission: AD | Admit: 2018-10-10 | Discharge: 2018-10-10 | Disposition: A | Discharge status: Home / Self Care | Payer: Medicaid Other | Attending: Obstetrics & Gynecology | Admitting: Obstetrics & Gynecology

## 2018-10-10 ENCOUNTER — Other Ambulatory Visit: Payer: Medicaid Other

## 2018-10-10 DIAGNOSIS — Z3200 Encounter for pregnancy test, result unknown: Secondary | ICD-10-CM

## 2018-10-11 LAB — BETA HCG QUANT (REF LAB): hCG Quant: <1 m[IU]/mL

## 2018-10-15 ENCOUNTER — Telehealth: Payer: Self-pay

## 2018-10-15 NOTE — Telephone Encounter (Signed)
Pt called requesting results.  Informed pt that her pregnancy hormone levels came back negative.  Pt stated "thank you".

## 2018-12-23 ENCOUNTER — Other Ambulatory Visit (HOSPITAL_COMMUNITY)
Admission: RE | Admit: 2018-12-23 | Discharge: 2018-12-23 | Disposition: A | Discharge status: Home / Self Care | Payer: Medicaid Other | Source: Ambulatory Visit | Attending: Obstetrics and Gynecology | Admitting: Obstetrics and Gynecology

## 2018-12-23 ENCOUNTER — Encounter (HOSPITAL_COMMUNITY): Payer: Self-pay | Admitting: *Deleted

## 2018-12-23 ENCOUNTER — Ambulatory Visit: Payer: Self-pay | Admitting: *Deleted

## 2018-12-23 ENCOUNTER — Inpatient Hospital Stay (HOSPITAL_COMMUNITY)
Admission: AD | Admit: 2018-12-23 | Discharge: 2018-12-23 | Disposition: A | Discharge status: Home / Self Care | Payer: Medicaid Other | Source: Ambulatory Visit | Attending: Obstetrics & Gynecology | Admitting: Obstetrics & Gynecology

## 2018-12-23 ENCOUNTER — Inpatient Hospital Stay (HOSPITAL_COMMUNITY)
Admission: AD | Admit: 2018-12-23 | Discharge: 2018-12-23 | Discharge status: Against Medical Advice | Payer: Medicaid Other | Attending: Obstetrics and Gynecology | Admitting: Obstetrics and Gynecology

## 2018-12-23 VITALS — BP 115/76 | HR 81 | Wt 116.0 lb

## 2018-12-23 DIAGNOSIS — N898 Other specified noninflammatory disorders of vagina: Secondary | ICD-10-CM | POA: Insufficient documentation

## 2018-12-23 LAB — URINALYSIS, ROUTINE W REFLEX MICROSCOPIC
BILIRUBIN URINE: NEGATIVE
GLUCOSE, UA: NEGATIVE mg/dL
HGB URINE DIPSTICK: NEGATIVE
KETONES UR: NEGATIVE mg/dL
Nitrite: NEGATIVE
Protein, ur: NEGATIVE mg/dL
SPECIFIC GRAVITY, URINE: 1.027 (ref 1.005–1.030)
pH: 5 (ref 5.0–8.0)

## 2018-12-23 LAB — POCT PREGNANCY, URINE: Preg Test, Ur: NEGATIVE

## 2018-12-23 NOTE — Progress Notes (Signed)
Pt called into triage, no answer, not in waiting room.

## 2018-12-23 NOTE — Progress Notes (Addendum)
Pt called 3rd time into triage for eval, again no answer.  No longer waiting to be seen.  Left prior to triage

## 2018-12-23 NOTE — MAU Provider Note (Signed)
Ms.Carmita Q Hornyak is a 23 y.o. [id number] non-pregnant female who presents to MAU today for vaginal irritation. She thinks she has BV. The patient denies abdominal pain or vaginal bleeding today.   BP 112/67 (BP Location: Right Arm)   Pulse 69   Temp 98.4 F (36.9 C) (Oral)   Resp 16   Ht 5\' 3"  (1.6 m)   Wt 52.6 kg   SpO2 99%   BMI 20.55 kg/m   CONSTITUTIONAL: Well-developed, well-nourished female in no acute distress.  CARDIOVASCULAR: Regular heart rate RESPIRATORY: Normal effort NEUROLOGICAL: Alert and oriented to person, place, and time.  SKIN: Skin is warm and dry. No rash noted. Not diaphoretic. No erythema. No pallor. PSYCH: Normal mood and affect. Normal behavior. Normal judgment and thought content.  MDM Medical Screen Exam Complete  A: Vaginal Irritation   P: Discharge from MAU Patient advised to follow-up with [facility] after-hours clinic for wet prep Monday-Thursday 4 pm-7:30 pm  Patient may return to MAU as needed or if her condition were to change or worsen  Patient verbalized an understanding of the plan of care and agrees.   Raelyn Mora, CNM  12/23/2018 6:07 PM

## 2018-12-23 NOTE — Progress Notes (Signed)
Think I have BV. Cloudy white d/c for a week. Denies any vag itching. Will self swab and then look for results on mychart. Understands treatment can be sent to pharmacy depending on test results and any needed treatment. Agrees with [employer]

## 2018-12-23 NOTE — Progress Notes (Signed)
I agree with the nurses note and documentation.   Duane Lope, NP 12/23/2018 6:58 PM

## 2018-12-23 NOTE — MAU Note (Signed)
Pt reports white discharge with odor. Denies vaginal itching.

## 2018-12-23 NOTE — Progress Notes (Signed)
Pt called again to be triaged, no response.

## 2018-12-25 LAB — CERVICOVAGINAL ANCILLARY ONLY
BACTERIAL VAGINITIS: POSITIVE — AB
CANDIDA VAGINITIS: NEGATIVE
CHLAMYDIA, DNA PROBE: NEGATIVE
Neisseria Gonorrhea: NEGATIVE
TRICH (WINDOWPATH): POSITIVE — AB

## 2018-12-30 ENCOUNTER — Telehealth: Payer: Self-pay | Admitting: Obstetrics and Gynecology

## 2018-12-30 MED ORDER — METRONIDAZOLE 500 MG PO TABS: 500.0000 mg | ORAL_TABLET | Freq: Two times a day (BID) | ORAL | 0 refills | Status: DC

## 2018-12-30 NOTE — Telephone Encounter (Signed)
+   BV and trichomonas.  Patient was called and notified today Allergies confirmed: none Rx: Flagyl BID X 7 days Partner needs treatment at the [facility]  Rasch, Harolyn Rutherford, NP 12/30/2018 12:03 PM

## 2019-02-05 ENCOUNTER — Other Ambulatory Visit: Payer: Self-pay

## 2019-02-05 ENCOUNTER — Emergency Department (HOSPITAL_COMMUNITY)
Admission: EM | Admit: 2019-02-05 | Discharge: 2019-02-05 | Disposition: A | Discharge status: Home / Self Care | Payer: Medicaid Other | Attending: Emergency Medicine | Admitting: Emergency Medicine

## 2019-02-05 ENCOUNTER — Encounter (HOSPITAL_COMMUNITY): Payer: Self-pay | Admitting: Emergency Medicine

## 2019-02-05 DIAGNOSIS — R05 Cough: Secondary | ICD-10-CM | POA: Diagnosis not present

## 2019-02-05 DIAGNOSIS — R0981 Nasal congestion: Secondary | ICD-10-CM | POA: Diagnosis not present

## 2019-02-05 DIAGNOSIS — Z0279 Encounter for issue of other medical certificate: Secondary | ICD-10-CM | POA: Insufficient documentation

## 2019-02-05 DIAGNOSIS — Z87891 Personal history of nicotine dependence: Secondary | ICD-10-CM | POA: Diagnosis not present

## 2019-02-05 DIAGNOSIS — J069 Acute upper respiratory infection, unspecified: Secondary | ICD-10-CM

## 2019-02-05 DIAGNOSIS — R0989 Other specified symptoms and signs involving the circulatory and respiratory systems: Secondary | ICD-10-CM | POA: Diagnosis present

## 2019-02-05 DIAGNOSIS — F1210 Cannabis abuse, uncomplicated: Secondary | ICD-10-CM | POA: Diagnosis not present

## 2019-02-05 LAB — CBC
HEMATOCRIT: 42.7 % (ref 36.0–46.0)
HEMOGLOBIN: 14 g/dL (ref 12.0–15.0)
MCH: 30.4 pg (ref 26.0–34.0)
MCHC: 32.8 g/dL (ref 30.0–36.0)
MCV: 92.6 fL (ref 80.0–100.0)
Platelets: 178 10*3/uL (ref 150–400)
RBC: 4.61 MIL/uL (ref 3.87–5.11)
RDW: 12.3 % (ref 11.5–15.5)
WBC: 3.7 10*3/uL — ABNORMAL LOW (ref 4.0–10.5)
nRBC: 0 % (ref 0.0–0.2)

## 2019-02-05 LAB — LIPASE, BLOOD: LIPASE: 32 U/L (ref 11–51)

## 2019-02-05 LAB — URINALYSIS, ROUTINE W REFLEX MICROSCOPIC
Bilirubin Urine: NEGATIVE
GLUCOSE, UA: NEGATIVE mg/dL
Hgb urine dipstick: NEGATIVE
Ketones, ur: 5 mg/dL — AB
LEUKOCYTE UA: NEGATIVE
Nitrite: NEGATIVE
PH: 5 (ref 5.0–8.0)
Protein, ur: NEGATIVE mg/dL
SPECIFIC GRAVITY, URINE: 1.031 — AB (ref 1.005–1.030)

## 2019-02-05 LAB — COMPREHENSIVE METABOLIC PANEL
ALT: 17 U/L (ref 0–44)
AST: 20 U/L (ref 15–41)
Albumin: 4.1 g/dL (ref 3.5–5.0)
Alkaline Phosphatase: 45 U/L (ref 38–126)
Anion gap: 7 (ref 5–15)
BUN: 17 mg/dL (ref 6–20)
CHLORIDE: 106 mmol/L (ref 98–111)
CO2: 26 mmol/L (ref 22–32)
CREATININE: 0.67 mg/dL (ref 0.44–1.00)
Calcium: 8.8 mg/dL — ABNORMAL LOW (ref 8.9–10.3)
GFR calc Af Amer: >60 mL/min (ref >60)
GFR calc non Af Amer: >60 mL/min (ref >60)
Glucose, Bld: 90 mg/dL (ref 70–99)
POTASSIUM: 3.5 mmol/L (ref 3.5–5.1)
SODIUM: 139 mmol/L (ref 135–145)
Total Bilirubin: 0.5 mg/dL (ref 0.3–1.2)
Total Protein: 7.5 g/dL (ref 6.5–8.1)

## 2019-02-05 LAB — I-STAT BETA HCG BLOOD, ED (MC, WL, AP ONLY): I-stat hCG, quantitative: <5 m[IU]/mL (ref <5)
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
## 2019-02-05 NOTE — ED Notes (Signed)
Pt declined IV

## 2019-02-05 NOTE — Discharge Instructions (Addendum)
Return for any problem.  Follow-up with your regular care provider as instructed. °

## 2019-02-05 NOTE — ED Triage Notes (Signed)
Pt c/o sinus pain, congestion, nausea and fever for 3 days. Reports taking Dayquil/Mucinex. Also no BM in 3 days.  Pt had abd cramping and some feelings of UTI.

## 2019-03-13 ENCOUNTER — Encounter (HOSPITAL_COMMUNITY): Payer: Self-pay

## 2019-03-13 ENCOUNTER — Emergency Department (HOSPITAL_COMMUNITY)
Admission: EM | Admit: 2019-03-13 | Discharge: 2019-03-14 | Disposition: A | Discharge status: Home / Self Care | Payer: Medicaid Other | Attending: Emergency Medicine | Admitting: Emergency Medicine

## 2019-03-13 ENCOUNTER — Other Ambulatory Visit: Payer: Self-pay

## 2019-03-13 DIAGNOSIS — F1290 Cannabis use, unspecified, uncomplicated: Secondary | ICD-10-CM | POA: Diagnosis not present

## 2019-03-13 DIAGNOSIS — J020 Streptococcal pharyngitis: Secondary | ICD-10-CM | POA: Insufficient documentation

## 2019-03-13 DIAGNOSIS — H9203 Otalgia, bilateral: Secondary | ICD-10-CM | POA: Insufficient documentation

## 2019-03-13 DIAGNOSIS — M7918 Myalgia, other site: Secondary | ICD-10-CM | POA: Insufficient documentation

## 2019-03-13 DIAGNOSIS — J029 Acute pharyngitis, unspecified: Secondary | ICD-10-CM | POA: Diagnosis present

## 2019-03-13 DIAGNOSIS — R590 Localized enlarged lymph nodes: Secondary | ICD-10-CM | POA: Insufficient documentation

## 2019-03-13 DIAGNOSIS — Z87891 Personal history of nicotine dependence: Secondary | ICD-10-CM | POA: Insufficient documentation

## 2019-03-13 LAB — GROUP A STREP BY PCR: Group A Strep by PCR: DETECTED — AB

## 2019-03-13 MED ORDER — ONDANSETRON 4 MG PO TBDP
8.0000 mg | ORAL_TABLET | Freq: Once | ORAL | Status: AC
  Administered 2019-03-13: 8 mg via ORAL
  Filled 2019-03-13: qty 2

## 2019-03-13 MED ORDER — IBUPROFEN 400 MG PO TABS
600.0000 mg | ORAL_TABLET | Freq: Once | ORAL | Status: AC
  Administered 2019-03-13: 600 mg via ORAL
  Filled 2019-03-13: qty 1

## 2019-03-13 MED ORDER — IBUPROFEN 800 MG PO TABS
800.0000 mg | ORAL_TABLET | Freq: Once | ORAL | Status: DC
  Filled 2019-03-13: qty 1
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
## 2019-03-13 NOTE — ED Triage Notes (Signed)
Pt reports generalized body aches onset this AM on waking. Pt reports sore throat, afebrile in triage. Placed surgical mask and placed in waiting room.

## 2019-03-14 MED ORDER — IBUPROFEN 600 MG PO TABS: 600.0000 mg | ORAL_TABLET | Freq: Four times a day (QID) | ORAL | 0 refills | Status: DC | PRN

## 2019-03-14 MED ORDER — PENICILLIN G BENZATHINE 1200000 UNIT/2ML IM SUSP
1.2000 10*6.[IU] | Freq: Once | INTRAMUSCULAR | Status: AC
  Administered 2019-03-14: 1.2 10*6.[IU] via INTRAMUSCULAR
  Filled 2019-03-14: qty 2

## 2019-03-14 NOTE — Discharge Instructions (Addendum)
We recommend salt water gargles 3-4 times per day.  Take 600 mg ibuprofen every 6 hours for management of body aches and fever.  You may take this with Tylenol/acetaminophen 1000 mg every 8 hours.  Use Chloraseptic spray or other over-the-counter remedies for management of persistent throat pain.  Drink plenty of fluids to prevent dehydration.  Follow-up with your primary care doctor to ensure that symptoms resolve.

## 2019-04-04 ENCOUNTER — Other Ambulatory Visit: Payer: Self-pay

## 2019-04-04 ENCOUNTER — Encounter (HOSPITAL_COMMUNITY): Payer: Self-pay

## 2019-04-04 ENCOUNTER — Emergency Department (HOSPITAL_COMMUNITY)
Admission: EM | Admit: 2019-04-04 | Discharge: 2019-04-04 | Disposition: A | Discharge status: Home / Self Care | Payer: Medicaid Other | Attending: Emergency Medicine | Admitting: Emergency Medicine

## 2019-04-04 DIAGNOSIS — F1290 Cannabis use, unspecified, uncomplicated: Secondary | ICD-10-CM | POA: Diagnosis not present

## 2019-04-04 DIAGNOSIS — R1084 Generalized abdominal pain: Secondary | ICD-10-CM | POA: Insufficient documentation

## 2019-04-04 DIAGNOSIS — R112 Nausea with vomiting, unspecified: Secondary | ICD-10-CM | POA: Diagnosis present

## 2019-04-04 DIAGNOSIS — R197 Diarrhea, unspecified: Secondary | ICD-10-CM | POA: Insufficient documentation

## 2019-04-04 DIAGNOSIS — Z87891 Personal history of nicotine dependence: Secondary | ICD-10-CM | POA: Insufficient documentation

## 2019-04-04 LAB — CBC
HCT: 40.3 % (ref 36.0–46.0)
Hemoglobin: 13.4 g/dL (ref 12.0–15.0)
MCH: 30.1 pg (ref 26.0–34.0)
MCHC: 33.3 g/dL (ref 30.0–36.0)
MCV: 90.6 fL (ref 80.0–100.0)
Platelets: 234 10*3/uL (ref 150–400)
RBC: 4.45 MIL/uL (ref 3.87–5.11)
RDW: 12.5 % (ref 11.5–15.5)
WBC: 5.8 10*3/uL (ref 4.0–10.5)
nRBC: 0 % (ref 0.0–0.2)

## 2019-04-04 LAB — COMPREHENSIVE METABOLIC PANEL
ALT: 15 U/L (ref 0–44)
AST: 19 U/L (ref 15–41)
Albumin: 4 g/dL (ref 3.5–5.0)
Alkaline Phosphatase: 34 U/L — ABNORMAL LOW (ref 38–126)
Anion gap: 10 (ref 5–15)
BUN: 10 mg/dL (ref 6–20)
CO2: 22 mmol/L (ref 22–32)
Calcium: 9.1 mg/dL (ref 8.9–10.3)
Chloride: 106 mmol/L (ref 98–111)
Creatinine, Ser: 0.71 mg/dL (ref 0.44–1.00)
GFR calc Af Amer: >60 mL/min (ref >60)
GFR calc non Af Amer: >60 mL/min (ref >60)
Glucose, Bld: 105 mg/dL — ABNORMAL HIGH (ref 70–99)
Potassium: 3.5 mmol/L (ref 3.5–5.1)
Sodium: 138 mmol/L (ref 135–145)
Total Bilirubin: 0.8 mg/dL (ref 0.3–1.2)
Total Protein: 7 g/dL (ref 6.5–8.1)

## 2019-04-04 LAB — URINALYSIS, ROUTINE W REFLEX MICROSCOPIC
Bilirubin Urine: NEGATIVE
Glucose, UA: NEGATIVE mg/dL
Ketones, ur: 20 mg/dL — AB
Leukocytes,Ua: NEGATIVE
Nitrite: NEGATIVE
Protein, ur: 100 mg/dL — AB
RBC / HPF: >50 RBC/hpf — ABNORMAL HIGH (ref 0–5)
Specific Gravity, Urine: 1.028 (ref 1.005–1.030)
pH: 5 (ref 5.0–8.0)

## 2019-04-04 LAB — I-STAT BETA HCG BLOOD, ED (MC, WL, AP ONLY): I-stat hCG, quantitative: <5 m[IU]/mL (ref <5)

## 2019-04-04 LAB — LIPASE, BLOOD: Lipase: 26 U/L (ref 11–51)

## 2019-04-04 MED ORDER — ONDANSETRON HCL 4 MG PO TABS: 4.0000 mg | ORAL_TABLET | Freq: Three times a day (TID) | ORAL | 0 refills | Status: DC | PRN

## 2019-04-04 MED ORDER — ONDANSETRON HCL 4 MG/2ML IJ SOLN
4.0000 mg | Freq: Once | INTRAMUSCULAR | Status: AC
  Administered 2019-04-04: 4 mg via INTRAVENOUS
  Filled 2019-04-04: qty 2

## 2019-04-04 MED ORDER — SODIUM CHLORIDE 0.9 % IV BOLUS
500.0000 mL | Freq: Once | INTRAVENOUS | Status: AC
  Administered 2019-04-04: 05:00:00 500 mL via INTRAVENOUS

## 2019-04-04 MED ORDER — SODIUM CHLORIDE 0.9% FLUSH: 3.0000 mL | Freq: Once | INTRAVENOUS | Status: DC

## 2019-04-04 NOTE — ED Notes (Signed)
Patient tolerated oral fluid( water) without getting nauseous.
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
## 2019-04-04 NOTE — ED Triage Notes (Signed)
Pt states that for the past two days she has been having abd pain with n/v, denies diarrhea or fevers, reports last period

## 2019-04-04 NOTE — Discharge Instructions (Signed)

## 2019-04-21 ENCOUNTER — Other Ambulatory Visit: Payer: Self-pay

## 2019-04-21 ENCOUNTER — Emergency Department (HOSPITAL_COMMUNITY)
Admission: EM | Admit: 2019-04-21 | Discharge: 2019-04-21 | Disposition: A | Discharge status: Home / Self Care | Payer: Medicaid Other | Attending: Emergency Medicine | Admitting: Emergency Medicine

## 2019-04-21 ENCOUNTER — Encounter (HOSPITAL_COMMUNITY): Payer: Self-pay | Admitting: Emergency Medicine

## 2019-04-21 DIAGNOSIS — Z7689 Persons encountering health services in other specified circumstances: Secondary | ICD-10-CM

## 2019-04-21 DIAGNOSIS — B9689 Other specified bacterial agents as the cause of diseases classified elsewhere: Secondary | ICD-10-CM

## 2019-04-21 DIAGNOSIS — N760 Acute vaginitis: Secondary | ICD-10-CM | POA: Diagnosis not present

## 2019-04-21 DIAGNOSIS — A599 Trichomoniasis, unspecified: Secondary | ICD-10-CM

## 2019-04-21 DIAGNOSIS — Z202 Contact with and (suspected) exposure to infections with a predominantly sexual mode of transmission: Secondary | ICD-10-CM | POA: Diagnosis present

## 2019-04-21 DIAGNOSIS — Z87891 Personal history of nicotine dependence: Secondary | ICD-10-CM | POA: Insufficient documentation

## 2019-04-21 LAB — URINALYSIS, ROUTINE W REFLEX MICROSCOPIC
Bilirubin Urine: NEGATIVE
Glucose, UA: NEGATIVE mg/dL
Hgb urine dipstick: NEGATIVE
Ketones, ur: NEGATIVE mg/dL
Leukocytes,Ua: NEGATIVE
Nitrite: NEGATIVE
Protein, ur: NEGATIVE mg/dL
Specific Gravity, Urine: 1.025 (ref 1.005–1.030)
pH: 5 (ref 5.0–8.0)

## 2019-04-21 LAB — WET PREP, GENITAL
Sperm: NONE SEEN
Yeast Wet Prep HPF POC: NONE SEEN

## 2019-04-21 LAB — POC URINE PREG, ED: Preg Test, Ur: NEGATIVE

## 2019-04-21 MED ORDER — METRONIDAZOLE 500 MG PO TABS: 500.0000 mg | ORAL_TABLET | Freq: Two times a day (BID) | ORAL | 0 refills | Status: DC

## 2019-04-21 MED ORDER — METRONIDAZOLE 500 MG PO TABS: 500.0000 mg | ORAL_TABLET | Freq: Two times a day (BID) | ORAL | 0 refills | Status: AC

## 2019-04-21 MED ORDER — CEFTRIAXONE SODIUM 250 MG IJ SOLR
250.0000 mg | Freq: Once | INTRAMUSCULAR | Status: AC
  Administered 2019-04-21: 250 mg via INTRAMUSCULAR
  Filled 2019-04-21: qty 250

## 2019-04-21 MED ORDER — AZITHROMYCIN 250 MG PO TABS
1000.0000 mg | ORAL_TABLET | Freq: Once | ORAL | Status: AC
  Administered 2019-04-21: 12:00:00 1000 mg via ORAL
  Filled 2019-04-21: qty 4

## 2019-04-21 NOTE — ED Triage Notes (Signed)
PT states suprapubic pain x 3 days.  She feels she has an std as she is experiencing vaginal discharge, but no bleeding and no urinary s/s.

## 2019-04-21 NOTE — ED Notes (Signed)
Patient verbalizes understanding of discharge instructions. Opportunity for questioning and answers were provided. Armband removed by staff, pt discharged from ED ambulatory to home.  

## 2019-04-21 NOTE — Discharge Instructions (Addendum)
You were given a prescription for antibiotics. Please take the antibiotic prescription fully.  Do not drink alcohol while taking Flagyl.  You have been tested for chlamydia and gonorrhea.  These results will be available in approximately 3 days and you will be contacted by the hospital if the results are positive. Avoid sexual contact until you are aware of the results and for at least 7 days after treatment.  please inform all sexual partners if you test positive for any of these diseases.  Please follow up with your primary care provider within 5-7 days for re-evaluation of your symptoms. If you do not have a primary care provider, information for a healthcare clinic has been provided for you to make arrangements for follow up care. Please return to the emergency department for any new or worsening symptoms.
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
## 2019-04-21 NOTE — ED Notes (Signed)
Patient is getting into a gown nurse at bedside

## 2019-04-22 LAB — GC/CHLAMYDIA PROBE AMP (~~LOC~~) NOT AT ARMC
Chlamydia: NEGATIVE
Neisseria Gonorrhea: NEGATIVE

## 2019-08-11 ENCOUNTER — Encounter (HOSPITAL_COMMUNITY): Payer: Self-pay

## 2019-08-11 ENCOUNTER — Other Ambulatory Visit: Payer: Self-pay

## 2019-08-11 ENCOUNTER — Ambulatory Visit (HOSPITAL_COMMUNITY)
Admission: EM | Admit: 2019-08-11 | Discharge: 2019-08-11 | Disposition: A | Discharge status: Home / Self Care | Payer: Medicaid Other | Attending: Family Medicine | Admitting: Family Medicine

## 2019-08-11 DIAGNOSIS — Z3202 Encounter for pregnancy test, result negative: Secondary | ICD-10-CM

## 2019-08-11 DIAGNOSIS — Z202 Contact with and (suspected) exposure to infections with a predominantly sexual mode of transmission: Secondary | ICD-10-CM | POA: Diagnosis present

## 2019-08-11 DIAGNOSIS — Z8619 Personal history of other infectious and parasitic diseases: Secondary | ICD-10-CM

## 2019-08-11 DIAGNOSIS — N898 Other specified noninflammatory disorders of vagina: Secondary | ICD-10-CM

## 2019-08-11 LAB — POCT URINALYSIS DIP (DEVICE)
Bilirubin Urine: NEGATIVE
Glucose, UA: NEGATIVE mg/dL
Hgb urine dipstick: NEGATIVE
Ketones, ur: NEGATIVE mg/dL
Leukocytes,Ua: NEGATIVE
Nitrite: NEGATIVE
Protein, ur: NEGATIVE mg/dL
Specific Gravity, Urine: >=1.03 (ref 1.005–1.030)
Urobilinogen, UA: 0.2 mg/dL (ref 0.0–1.0)
pH: 6 (ref 5.0–8.0)

## 2019-08-11 LAB — POCT PREGNANCY, URINE: Preg Test, Ur: NEGATIVE

## 2019-08-11 MED ORDER — METRONIDAZOLE 500 MG PO TABS
ORAL_TABLET | ORAL | Status: AC
  Filled 2019-08-11: qty 4

## 2019-08-11 MED ORDER — METRONIDAZOLE 500 MG PO TABS
2000.0000 mg | ORAL_TABLET | Freq: Once | ORAL | Status: AC
  Administered 2019-08-11: 2000 mg via ORAL

## 2019-08-11 MED ORDER — ONDANSETRON 4 MG PO TBDP
4.0000 mg | ORAL_TABLET | Freq: Once | ORAL | Status: AC
  Administered 2019-08-11: 4 mg via ORAL

## 2019-08-11 MED ORDER — ONDANSETRON 4 MG PO TBDP
ORAL_TABLET | ORAL | Status: AC
  Filled 2019-08-11: qty 1

## 2019-08-11 NOTE — ED Triage Notes (Signed)
Patient presents to Urgent Care with complaints of needing additional testing and treatment for trichomonas since her boyfriend never got treated after she tested positive a few weeks ago. Patient reports all her symptoms are the same as last time.

## 2019-08-11 NOTE — Discharge Instructions (Signed)
Avoid sexual relations for 7 days after treatement MAKE CERTAIN your partner gets treated
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
## 2019-08-11 NOTE — ED Notes (Signed)
Patient called x 2 for exam room with no response.

## 2019-08-12 LAB — CERVICOVAGINAL ANCILLARY ONLY
Chlamydia: NEGATIVE
Neisseria Gonorrhea: NEGATIVE
Trichomonas: NEGATIVE

## 2019-10-04 ENCOUNTER — Encounter (HOSPITAL_COMMUNITY): Payer: Self-pay

## 2019-10-04 ENCOUNTER — Emergency Department (HOSPITAL_COMMUNITY): Payer: Medicaid Other

## 2019-10-04 ENCOUNTER — Ambulatory Visit (HOSPITAL_COMMUNITY)
Admission: EM | Admit: 2019-10-04 | Discharge: 2019-10-04 | Disposition: A | Discharge status: Home / Self Care | Payer: Medicaid Other

## 2019-10-04 ENCOUNTER — Emergency Department (HOSPITAL_COMMUNITY)
Admission: EM | Admit: 2019-10-04 | Discharge: 2019-10-04 | Disposition: A | Discharge status: Home / Self Care | Payer: Medicaid Other | Attending: Emergency Medicine | Admitting: Emergency Medicine

## 2019-10-04 ENCOUNTER — Other Ambulatory Visit: Payer: Self-pay

## 2019-10-04 DIAGNOSIS — B9689 Other specified bacterial agents as the cause of diseases classified elsewhere: Secondary | ICD-10-CM

## 2019-10-04 DIAGNOSIS — N760 Acute vaginitis: Secondary | ICD-10-CM | POA: Insufficient documentation

## 2019-10-04 DIAGNOSIS — R1031 Right lower quadrant pain: Secondary | ICD-10-CM | POA: Diagnosis present

## 2019-10-04 DIAGNOSIS — F17210 Nicotine dependence, cigarettes, uncomplicated: Secondary | ICD-10-CM | POA: Insufficient documentation

## 2019-10-04 LAB — CBC WITH DIFFERENTIAL/PLATELET
Abs Immature Granulocytes: 0 10*3/uL (ref 0.00–0.07)
Basophils Absolute: 0 10*3/uL (ref 0.0–0.1)
Basophils Relative: 0 %
Eosinophils Absolute: 0.4 10*3/uL (ref 0.0–0.5)
Eosinophils Relative: 7 %
HCT: 39 % (ref 36.0–46.0)
Hemoglobin: 13.2 g/dL (ref 12.0–15.0)
Lymphocytes Relative: 54 %
Lymphs Abs: 2.8 10*3/uL (ref 0.7–4.0)
MCH: 30.6 pg (ref 26.0–34.0)
MCHC: 33.8 g/dL (ref 30.0–36.0)
MCV: 90.5 fL (ref 80.0–100.0)
Monocytes Absolute: 0.3 10*3/uL (ref 0.1–1.0)
Monocytes Relative: 5 %
Neutro Abs: 1.7 10*3/uL (ref 1.7–7.7)
Neutrophils Relative %: 34 %
Platelets: 228 10*3/uL (ref 150–400)
RBC: 4.31 MIL/uL (ref 3.87–5.11)
RDW: 12.2 % (ref 11.5–15.5)
WBC: 5.1 10*3/uL (ref 4.0–10.5)
nRBC: 0 % (ref 0.0–0.2)
nRBC: 0 /100 WBC

## 2019-10-04 LAB — URINALYSIS, ROUTINE W REFLEX MICROSCOPIC
Bilirubin Urine: NEGATIVE
Glucose, UA: NEGATIVE mg/dL
Hgb urine dipstick: NEGATIVE
Ketones, ur: 5 mg/dL — AB
Leukocytes,Ua: NEGATIVE
Nitrite: NEGATIVE
Protein, ur: NEGATIVE mg/dL
Specific Gravity, Urine: 1.025 (ref 1.005–1.030)
pH: 6 (ref 5.0–8.0)

## 2019-10-04 LAB — WET PREP, GENITAL
Sperm: NONE SEEN
Trich, Wet Prep: NONE SEEN
Yeast Wet Prep HPF POC: NONE SEEN

## 2019-10-04 LAB — COMPREHENSIVE METABOLIC PANEL
ALT: 22 U/L (ref 0–44)
AST: 18 U/L (ref 15–41)
Albumin: 4.1 g/dL (ref 3.5–5.0)
Alkaline Phosphatase: 31 U/L — ABNORMAL LOW (ref 38–126)
Anion gap: 9 (ref 5–15)
BUN: 10 mg/dL (ref 6–20)
CO2: 20 mmol/L — ABNORMAL LOW (ref 22–32)
Calcium: 9.4 mg/dL (ref 8.9–10.3)
Chloride: 109 mmol/L (ref 98–111)
Creatinine, Ser: 0.59 mg/dL (ref 0.44–1.00)
GFR calc Af Amer: >60 mL/min (ref >60)
GFR calc non Af Amer: >60 mL/min (ref >60)
Glucose, Bld: 91 mg/dL (ref 70–99)
Potassium: 3.9 mmol/L (ref 3.5–5.1)
Sodium: 138 mmol/L (ref 135–145)
Total Bilirubin: 0.9 mg/dL (ref 0.3–1.2)
Total Protein: 7.1 g/dL (ref 6.5–8.1)

## 2019-10-04 LAB — I-STAT BETA HCG BLOOD, ED (MC, WL, AP ONLY): I-stat hCG, quantitative: <5 m[IU]/mL (ref <5)

## 2019-10-04 LAB — HIV ANTIBODY (ROUTINE TESTING W REFLEX): HIV Screen 4th Generation wRfx: NONREACTIVE

## 2019-10-04 MED ORDER — MORPHINE SULFATE (PF) 4 MG/ML IV SOLN
4.0000 mg | Freq: Once | INTRAVENOUS | Status: AC
  Administered 2019-10-04: 4 mg via INTRAVENOUS
  Filled 2019-10-04: qty 1

## 2019-10-04 MED ORDER — HYDROMORPHONE HCL 1 MG/ML IJ SOLN
0.5000 mg | Freq: Once | INTRAMUSCULAR | Status: AC
  Administered 2019-10-04: 0.5 mg via INTRAVENOUS
  Filled 2019-10-04: qty 1

## 2019-10-04 MED ORDER — SODIUM CHLORIDE 0.9 % IV BOLUS
1000.0000 mL | Freq: Once | INTRAVENOUS | Status: AC
  Administered 2019-10-04: 1000 mL via INTRAVENOUS

## 2019-10-04 MED ORDER — NAPROXEN 500 MG PO TABS: 500.0000 mg | ORAL_TABLET | Freq: Two times a day (BID) | ORAL | 0 refills | Status: DC

## 2019-10-04 MED ORDER — ONDANSETRON HCL 4 MG/2ML IJ SOLN
4.0000 mg | Freq: Once | INTRAMUSCULAR | Status: AC
  Administered 2019-10-04: 4 mg via INTRAVENOUS
  Filled 2019-10-04: qty 2

## 2019-10-04 MED ORDER — DIPHENHYDRAMINE HCL 12.5 MG/5ML PO ELIX
25.0000 mg | ORAL_SOLUTION | Freq: Once | ORAL | Status: AC
  Administered 2019-10-04: 25 mg via ORAL
  Filled 2019-10-04: qty 10

## 2019-10-04 MED ORDER — METRONIDAZOLE 500 MG PO TABS: 500.0000 mg | ORAL_TABLET | Freq: Two times a day (BID) | ORAL | 0 refills | Status: DC

## 2019-10-04 MED ORDER — KETOROLAC TROMETHAMINE 30 MG/ML IJ SOLN
30.0000 mg | Freq: Once | INTRAMUSCULAR | Status: AC
  Administered 2019-10-04: 30 mg via INTRAVENOUS
  Filled 2019-10-04: qty 1

## 2019-10-04 MED ORDER — IOHEXOL 300 MG/ML  SOLN
100.0000 mL | Freq: Once | INTRAMUSCULAR | Status: AC | PRN
  Administered 2019-10-04: 100 mL via INTRAVENOUS

## 2019-10-04 NOTE — ED Notes (Addendum)
Pt reports itchiness after return from CT scan. PA made aware of this.

## 2019-10-04 NOTE — ED Notes (Signed)
Pt reports relief from itchiness following Benadryl. Pt denies any difficulty breathing or shortness of breath.

## 2019-10-04 NOTE — ED Notes (Signed)
Patient is being discharged from the Urgent [facility] and sent to the Emergency Department via wheelchair by staff. Per michell, pa, patient is stable but in need of higher level of care due to extreme abdominal pain. Patient is aware and verbalizes understanding of plan of care. There were no vitals filed for this visit.

## 2019-10-04 NOTE — ED Notes (Signed)
Patient transported to Ultrasound 

## 2019-10-04 NOTE — ED Notes (Signed)
Patient writhing in wheelchair.  Patient has complained of excruciating pain.  Patient points to right lower quadrant of abdomen.  Patient reports this started 15 minutes ago.  Patient is in a wheelchair and and unable to sit still for exam.  Notified michelle, pa. Patient has person with her and agreeable to ED as directed by PA
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
## 2019-10-04 NOTE — ED Triage Notes (Signed)
Patient was sent from urgent care, reports lower right abdominal cramping starting this morning. Pain is intermittent 10/10 pain. Patient denies taking anything for the pain.

## 2019-10-04 NOTE — Discharge Instructions (Addendum)
Take Tylenol and Ibuprofen as needed for pain. Do not take more than 4000 g Tylenol or more than 2400 mg ibuprofen daily.  He also place heating pad to the area.  If you have new or worsening pain unresolved Tylenol ibuprofen please seek reevaluation the emergency department.

## 2019-10-04 NOTE — ED Notes (Signed)
Patient tolerating fluid challenge well, denies N/V.

## 2019-10-05 LAB — RPR: RPR Ser Ql: NONREACTIVE

## 2019-10-06 LAB — URINE CULTURE: Culture: 1000 — AB

## 2019-10-07 LAB — GC/CHLAMYDIA PROBE AMP (~~LOC~~) NOT AT ARMC
Chlamydia: NEGATIVE
Neisseria Gonorrhea: NEGATIVE

## 2020-03-14 ENCOUNTER — Other Ambulatory Visit: Payer: Self-pay

## 2020-03-14 ENCOUNTER — Encounter (HOSPITAL_COMMUNITY): Payer: Self-pay

## 2020-03-14 ENCOUNTER — Ambulatory Visit (HOSPITAL_COMMUNITY)
Admission: EM | Admit: 2020-03-14 | Discharge: 2020-03-14 | Disposition: A | Discharge status: Home / Self Care | Payer: Medicaid Other | Attending: Family Medicine | Admitting: Family Medicine

## 2020-03-14 DIAGNOSIS — Z3202 Encounter for pregnancy test, result negative: Secondary | ICD-10-CM

## 2020-03-14 DIAGNOSIS — K529 Noninfective gastroenteritis and colitis, unspecified: Secondary | ICD-10-CM | POA: Diagnosis present

## 2020-03-14 DIAGNOSIS — R112 Nausea with vomiting, unspecified: Secondary | ICD-10-CM | POA: Diagnosis present

## 2020-03-14 LAB — POC URINE PREG, ED: Preg Test, Ur: NEGATIVE

## 2020-03-14 LAB — POCT URINALYSIS DIP (DEVICE)
Glucose, UA: NEGATIVE mg/dL
Hgb urine dipstick: NEGATIVE
Nitrite: NEGATIVE
Protein, ur: 100 mg/dL — AB
Specific Gravity, Urine: >=1.03 (ref 1.005–1.030)
Urobilinogen, UA: 0.2 mg/dL (ref 0.0–1.0)
pH: 6 (ref 5.0–8.0)

## 2020-03-14 LAB — POCT PREGNANCY, URINE: Preg Test, Ur: NEGATIVE

## 2020-03-14 MED ORDER — ONDANSETRON HCL 4 MG/2ML IJ SOLN
4.0000 mg | Freq: Once | INTRAMUSCULAR | Status: AC
  Administered 2020-03-14: 4 mg via INTRAMUSCULAR

## 2020-03-14 MED ORDER — ONDANSETRON HCL 4 MG/2ML IJ SOLN
INTRAMUSCULAR | Status: AC
  Filled 2020-03-14: qty 2

## 2020-03-14 MED ORDER — ONDANSETRON HCL 4 MG PO TABS: 4.0000 mg | ORAL_TABLET | Freq: Four times a day (QID) | ORAL | 0 refills | Status: DC

## 2020-03-14 NOTE — Discharge Instructions (Addendum)
I have sent in nausea medication to your pharmacy for you to take one tablet every 6 hours as needed for nausea and vomiting.  You have received 4 mg Zofran injection in the office today.

## 2020-03-14 NOTE — ED Triage Notes (Signed)
Pt present abdominal pain, symptoms started this am after she ate [employer]. She been vomiting and nausea on the stomach.

## 2020-03-15 LAB — URINE CULTURE
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
## 2020-04-08 ENCOUNTER — Encounter (HOSPITAL_COMMUNITY): Payer: Self-pay

## 2020-04-08 ENCOUNTER — Other Ambulatory Visit: Payer: Self-pay

## 2020-04-08 ENCOUNTER — Ambulatory Visit (HOSPITAL_COMMUNITY)
Admission: EM | Admit: 2020-04-08 | Discharge: 2020-04-08 | Disposition: A | Discharge status: Home / Self Care | Payer: Medicaid Other | Attending: Physician Assistant | Admitting: Physician Assistant

## 2020-04-08 DIAGNOSIS — N898 Other specified noninflammatory disorders of vagina: Secondary | ICD-10-CM | POA: Insufficient documentation

## 2020-04-08 DIAGNOSIS — J020 Streptococcal pharyngitis: Secondary | ICD-10-CM

## 2020-04-08 LAB — POC URINE PREG, ED: Preg Test, Ur: NEGATIVE

## 2020-04-08 LAB — POCT RAPID STREP A: Streptococcus, Group A Screen (Direct): POSITIVE — AB

## 2020-04-08 MED ORDER — IBUPROFEN 600 MG PO TABS: 600.0000 mg | ORAL_TABLET | Freq: Four times a day (QID) | ORAL | 0 refills | Status: DC | PRN

## 2020-04-08 MED ORDER — AMOXICILLIN 500 MG PO CAPS: 500.0000 mg | ORAL_CAPSULE | Freq: Two times a day (BID) | ORAL | 0 refills | Status: AC
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
## 2020-04-08 NOTE — Discharge Instructions (Addendum)
Take the amoxicillin twice a day for 10 days.  Ensure you complete all of this medicine  Take the ibuprofen as prescribed up to every 6 hours.  May also do salt water gargles and use over-the-counter throat lozenges for relief.  You may return to work after 24 hours of being on the antibiotics.  We have sent your swab and will notify you of any necessary treatments.  If you develop feeling of significant throat swelling, severe difficulty swallowing or run high fevers please return for reevaluation.  Please schedule the primary care to establish care to discuss long-term management and possible referral to ENT.

## 2020-04-08 NOTE — ED Triage Notes (Addendum)
Patient reports pus pocket in throat last night, and white spots today. Denies any other symptoms. Hx frequent strep infections. Also c/o vaginal odor x1 wk.  Denies concern for COVID

## 2020-04-09 ENCOUNTER — Telehealth (HOSPITAL_COMMUNITY): Payer: Self-pay | Admitting: Orthopedic Surgery

## 2020-04-09 LAB — CERVICOVAGINAL ANCILLARY ONLY
Bacterial Vaginitis (gardnerella): POSITIVE — AB
Candida Glabrata: NEGATIVE
Candida Vaginitis: NEGATIVE
Chlamydia: NEGATIVE
Comment: NEGATIVE
Comment: NEGATIVE
Comment: NEGATIVE
Comment: NEGATIVE
Comment: NEGATIVE
Comment: NORMAL
Neisseria Gonorrhea: NEGATIVE
Trichomonas: NEGATIVE

## 2020-04-09 MED ORDER — METRONIDAZOLE 500 MG PO TABS: 500.0000 mg | ORAL_TABLET | Freq: Two times a day (BID) | ORAL | 0 refills | Status: DC

## 2020-07-29 ENCOUNTER — Ambulatory Visit (HOSPITAL_COMMUNITY)
Admission: EM | Admit: 2020-07-29 | Discharge: 2020-07-29 | Disposition: A | Discharge status: Home / Self Care | Payer: Medicaid Other | Attending: Physician Assistant | Admitting: Physician Assistant

## 2020-07-29 ENCOUNTER — Other Ambulatory Visit: Payer: Self-pay

## 2020-07-29 ENCOUNTER — Encounter (HOSPITAL_COMMUNITY): Payer: Self-pay | Admitting: Emergency Medicine

## 2020-07-29 DIAGNOSIS — M545 Low back pain, unspecified: Secondary | ICD-10-CM

## 2020-07-29 DIAGNOSIS — U071 COVID-19: Secondary | ICD-10-CM | POA: Diagnosis not present

## 2020-07-29 DIAGNOSIS — Z3202 Encounter for pregnancy test, result negative: Secondary | ICD-10-CM | POA: Diagnosis not present

## 2020-07-29 DIAGNOSIS — K5900 Constipation, unspecified: Secondary | ICD-10-CM

## 2020-07-29 DIAGNOSIS — B349 Viral infection, unspecified: Secondary | ICD-10-CM

## 2020-07-29 LAB — POCT URINALYSIS DIPSTICK, ED / UC
Bilirubin Urine: NEGATIVE
Glucose, UA: NEGATIVE mg/dL
Hgb urine dipstick: NEGATIVE
Ketones, ur: NEGATIVE mg/dL
Leukocytes,Ua: NEGATIVE
Nitrite: NEGATIVE
Protein, ur: NEGATIVE mg/dL
Specific Gravity, Urine: >=1.03 (ref 1.005–1.030)
Urobilinogen, UA: 0.2 mg/dL (ref 0.0–1.0)
pH: 5.5 (ref 5.0–8.0)

## 2020-07-29 LAB — POC URINE PREG, ED: Preg Test, Ur: NEGATIVE

## 2020-07-29 LAB — SARS CORONAVIRUS 2 (TAT 6-24 HRS): SARS Coronavirus 2: POSITIVE — AB

## 2020-07-29 MED ORDER — ACETAMINOPHEN 325 MG PO TABS: 650.0000 mg | ORAL_TABLET | Freq: Four times a day (QID) | ORAL | 0 refills | Status: DC | PRN

## 2020-07-29 MED ORDER — POLYETHYLENE GLYCOL 3350 17 GM/SCOOP PO POWD: 17.0000 g | Freq: Every day | ORAL | 0 refills | Status: DC

## 2020-07-29 MED ORDER — IBUPROFEN 400 MG PO TABS: 400.0000 mg | ORAL_TABLET | Freq: Four times a day (QID) | ORAL | 0 refills | Status: DC | PRN

## 2020-07-29 MED ORDER — BENZONATATE 100 MG PO CAPS: 100.0000 mg | ORAL_CAPSULE | Freq: Three times a day (TID) | ORAL | 0 refills | Status: DC | PRN

## 2020-07-29 MED ORDER — FLUTICASONE PROPIONATE 50 MCG/ACT NA SUSP: 1.0000 | Freq: Every day | NASAL | 2 refills | Status: DC

## 2020-07-29 NOTE — Discharge Instructions (Addendum)
Take medications as prescribed -Use Flonase daily -Tylenol and ibuprofen for body ache and backache -Use the Tessalon/benzonatate as needed for cough -Use 1 scoop of MiraLAX in beverage of choice daily until you have regular soft bowel movements  If severe symptoms of severe pain,, severe abdominal pain, high fever, shortness of breath or other concerning symptoms go to the emergency department  Follow-up with your primary care and if you do not have 1 establish with a family medicine center  If your Covid-19 test is positive, you will receive a phone call from [facility] regarding your results. Negative test results are not called. Both positive and negative results area always visible on MyChart. If you do not have a MyChart account, sign up instructions are in your discharge papers.   Persons who are directed to care for themselves at home may discontinue isolation under the following conditions:   At least 10 days have passed since symptom onset and  At least 24 hours have passed without running a fever (this means without the use of fever-reducing medications) and  Other symptoms have improved.  Persons infected with COVID-19 who never develop symptoms may discontinue isolation and other precautions 10 days after the date of their first positive COVID-19 test.

## 2020-07-29 NOTE — ED Triage Notes (Signed)
Pt c/o back pain, "the whole back" feels sore for about a week. Pt states she has been constipated and feels the urge to have a BM but cannot. Pt also states she has a headache for the past week. Pt denies sensitivity to light or sound.
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
## 2020-09-10 ENCOUNTER — Ambulatory Visit (HOSPITAL_COMMUNITY)
Admission: EM | Admit: 2020-09-10 | Discharge: 2020-09-10 | Disposition: A | Discharge status: Home / Self Care | Payer: Medicaid Other | Attending: Family Medicine | Admitting: Family Medicine

## 2020-09-10 ENCOUNTER — Encounter (HOSPITAL_COMMUNITY): Payer: Self-pay | Admitting: Emergency Medicine

## 2020-09-10 ENCOUNTER — Other Ambulatory Visit: Payer: Self-pay

## 2020-09-10 DIAGNOSIS — N3090 Cystitis, unspecified without hematuria: Secondary | ICD-10-CM | POA: Diagnosis not present

## 2020-09-10 LAB — POCT URINALYSIS DIPSTICK, ED / UC
Bilirubin Urine: NEGATIVE
Glucose, UA: NEGATIVE mg/dL
Ketones, ur: NEGATIVE mg/dL
Nitrite: NEGATIVE
Protein, ur: 100 mg/dL — AB
Specific Gravity, Urine: >=1.03 (ref 1.005–1.030)
Urobilinogen, UA: 1 mg/dL (ref 0.0–1.0)
pH: 6.5 (ref 5.0–8.0)

## 2020-09-10 LAB — POC URINE PREG, ED: Preg Test, Ur: NEGATIVE

## 2020-09-10 MED ORDER — PHENAZOPYRIDINE HCL 200 MG PO TABS: 200.0000 mg | ORAL_TABLET | Freq: Three times a day (TID) | ORAL | 0 refills | Status: DC

## 2020-09-10 MED ORDER — NITROFURANTOIN MONOHYD MACRO 100 MG PO CAPS: 100.0000 mg | ORAL_CAPSULE | Freq: Two times a day (BID) | ORAL | 0 refills | Status: DC

## 2020-09-10 NOTE — Discharge Instructions (Addendum)
Drink plenty of water Take the antibiotic nitrofurantoin 2 times a day for 5 days Take the Pyridium (phenazopyridine) 3 times a day to reduce urine pain This will make your urine bright orange We will call you if you need additional care based on your urine culture

## 2020-09-10 NOTE — ED Triage Notes (Signed)
Patient c/o dysuria x 3 days. Patient states pain has worsened today.   Patient states she's tried cranberry juice, water, and vitamin C w/o change in symptoms.   Patient states she may need pregnancy test.   History of UTI    Patient states "peeing is painful and it gives me an urge to push".   Patient states "I know I have a UTI because I have blood clots coming out of my pee hole and vagina".
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
## 2020-09-12 LAB — URINE CULTURE: Culture: >=100000 — AB

## 2020-09-14 ENCOUNTER — Other Ambulatory Visit: Payer: Self-pay

## 2020-09-14 ENCOUNTER — Emergency Department (HOSPITAL_COMMUNITY)
Admission: EM | Admit: 2020-09-14 | Discharge: 2020-09-14 | Discharge status: Against Medical Advice | Payer: Medicaid Other

## 2020-09-14 NOTE — ED Notes (Signed)
Called for triage and unable to locate ?

## 2020-09-15 ENCOUNTER — Ambulatory Visit (HOSPITAL_COMMUNITY)
Admission: EM | Admit: 2020-09-15 | Discharge: 2020-09-15 | Disposition: A | Discharge status: Home / Self Care | Payer: Medicaid Other | Attending: Family Medicine | Admitting: Family Medicine

## 2020-09-15 ENCOUNTER — Encounter (HOSPITAL_COMMUNITY): Payer: Self-pay | Admitting: *Deleted

## 2020-09-15 ENCOUNTER — Other Ambulatory Visit: Payer: Self-pay

## 2020-09-15 DIAGNOSIS — N926 Irregular menstruation, unspecified: Secondary | ICD-10-CM | POA: Insufficient documentation

## 2020-09-15 LAB — POCT URINALYSIS DIPSTICK, ED / UC
Bilirubin Urine: NEGATIVE
Glucose, UA: NEGATIVE mg/dL
Hgb urine dipstick: NEGATIVE
Ketones, ur: 15 mg/dL — AB
Leukocytes,Ua: NEGATIVE
Nitrite: NEGATIVE
Protein, ur: NEGATIVE mg/dL
Specific Gravity, Urine: 1.025 (ref 1.005–1.030)
Urobilinogen, UA: 4 mg/dL — ABNORMAL HIGH (ref 0.0–1.0)
pH: 7 (ref 5.0–8.0)

## 2020-09-15 LAB — HCG, QUANTITATIVE, PREGNANCY: hCG, Beta Chain, Quant, S: <1 m[IU]/mL (ref <5)

## 2020-09-15 LAB — POC URINE PREG, ED: Preg Test, Ur: NEGATIVE

## 2020-09-15 NOTE — ED Triage Notes (Signed)
Patient in voicing  Concern about pregnancy since she has not had a menstrual cycle since 07/13/20. Patient denies abdominal pain.Patient states that before that time her cycles were regular. Patient requesting blood test for pregnancy. Patient was recently treated for a UTI on 09/10/20. Patient states she is still experiencing urinary urgency.

## 2020-09-18 NOTE — ED Provider Notes (Signed)
[facility]   [id number] 09/15/20 Arrival Time: 1050  ASSESSMENT & PLAN:  1. Irregular menstrual cycle    UPT negative. Beta HCG pending. Benign abdominal exam. No indications for urgent abdominal/pelvic imaging at this time. Discussed.  Recommend:  Follow-up Information    Schedule an appointment as soon as possible for a visit  with [facility].   Specialty: Obstetrics and Gynecology Contact information: [address] [place] [address] [phone]               Reviewed expectations re: course of current medical issues. Questions answered. Outlined signs and symptoms indicating need for more acute intervention. Patient verbalized understanding. After Visit Summary given.   SUBJECTIVE: History from: patient. Linda Khan is a 24 y.o. female who reports irreg menstrual cycle. Requests pregnancy testing. Patient's last menstrual period was 07/13/2020. No abdominal or back pain. No vag d/c. Occasional urinary frequency. Afebrile.  Past Surgical History:  Procedure Laterality Date  . WISDOM TOOTH EXTRACTION       OBJECTIVE:  Vitals:   09/15/20 1230  BP: 127/89  Pulse: 83  Resp: 16  Temp: 98.7 F (37.1 C)  TempSrc: Oral  SpO2: 100%    General appearance: alert, oriented, no acute distress HEENT: [place]; AT; oropharynx moist Lungs: unlabored respirations Abdomen: soft; without distention; no specific tenderness to palpation; normal bowel sounds; without masses or organomegaly; without guarding or rebound tenderness Back: without reported CVA tenderness; FROM at waist Extremities: without LE edema; symmetrical; without gross deformities Skin: warm and dry Neurologic: normal gait Psychological: alert and cooperative; normal mood and affect  Labs: Results for orders placed or performed during   POC Urinalysis dipstick  Result Value Ref Range   Glucose, UA NEGATIVE  NEGATIVE mg/dL   Bilirubin Urine NEGATIVE NEGATIVE   Ketones, ur 15 (A) NEGATIVE mg/dL   Specific Gravity, Urine 1.025 1.005 - 1.030   Hgb urine dipstick NEGATIVE NEGATIVE   pH 7.0 5.0 - 8.0   Protein, ur NEGATIVE NEGATIVE mg/dL   Urobilinogen, UA 4.0 (H) 0.0 - 1.0 mg/dL   Nitrite NEGATIVE NEGATIVE   Leukocytes,Ua NEGATIVE NEGATIVE  POC urine pregnancy  Result Value Ref Range   Preg Test, Ur NEGATIVE NEGATIVE       No Known Allergies                                             Past Medical History:  Diagnosis Date  . Bell's palsy   . Bell's palsy   . Bronchitis   . Chlamydia   . Gonorrhea   . Trichomonas vaginitis   . UTI (urinary tract infection)     Social History   Socioeconomic History  . Marital status: Single    Spouse name: Not on file  . Number of children: Not on file  . Years of education: Not on file  . Highest education level: Not on file  Occupational History  . Not on file  Tobacco Use  . Smoking status: Current Some Day Smoker    Packs/day: 0.25    Types: Cigarettes  . Smokeless tobacco: Never Used  Vaping Use  . Vaping Use: Never used  Substance and Sexual Activity  . Alcohol use: Yes    Comment: occasional  . Drug use: Yes    Types: Marijuana  Comment: daily  . Sexual activity: Yes    Birth control/protection: I.U.D.  Other Topics Concern  . Not on file  Social History Narrative   ** Merged History Encounter **       ** Merged History Encounter **       Social Determinants of Health   Financial Resource Strain:   . Difficulty of Paying Living Expenses: Not on file  Food Insecurity:   . Worried About Programme researcher, broadcasting/film/video in the Last Year: Not on file  . Ran Out of Food in the Last Year: Not on file  Transportation Needs:   . Lack of Transportation (Medical): Not on file  . Lack of Transportation (Non-Medical): Not on file  Physical Activity:   . Days of Exercise per Week: Not on file  . Minutes of Exercise per Session: Not on  file  Stress:   . Feeling of Stress : Not on file  Social Connections:   . Frequency of Communication with Friends and Family: Not on file  . Frequency of Social Gatherings with Friends and Family: Not on file  . Attends Religious Services: Not on file  . Active Member of Clubs or Organizations: Not on file  . Attends Banker Meetings: Not on file  . Marital Status: Not on file  Intimate Partner Violence:   . Fear of Current or Ex-Partner: Not on file  . Emotionally Abused: Not on file  . Physically Abused: Not on file  . Sexually Abused: Not on file    Family History  Problem Relation Age of Onset  . Diabetes Maternal Aunt   . Diabetes Maternal Grandmother   . Diabetes Paternal Grandmother   . Asthma Mother   . Healthy Father      Mardella Layman, MD 09/18/20 [phone]

## 2021-03-07 ENCOUNTER — Other Ambulatory Visit: Payer: Self-pay

## 2021-03-07 ENCOUNTER — Encounter (HOSPITAL_COMMUNITY): Payer: Self-pay

## 2021-03-07 ENCOUNTER — Ambulatory Visit (INDEPENDENT_AMBULATORY_CARE_PROVIDER_SITE_OTHER): Payer: Medicaid Other

## 2021-03-07 ENCOUNTER — Ambulatory Visit (HOSPITAL_COMMUNITY)
Admission: EM | Admit: 2021-03-07 | Discharge: 2021-03-07 | Disposition: A | Discharge status: Home / Self Care | Payer: Medicaid Other | Attending: Emergency Medicine | Admitting: Emergency Medicine

## 2021-03-07 DIAGNOSIS — M79672 Pain in left foot: Secondary | ICD-10-CM

## 2021-03-07 DIAGNOSIS — S93602A Unspecified sprain of left foot, initial encounter: Secondary | ICD-10-CM | POA: Diagnosis not present

## 2021-03-07 MED ORDER — NAPROXEN 500 MG PO TABS: 500.0000 mg | ORAL_TABLET | Freq: Two times a day (BID) | ORAL | 0 refills | Status: AC

## 2021-03-07 NOTE — ED Triage Notes (Signed)
Pt presents with left foot pain that sometimes radiates up to ankle, pt states she cannot remember any injury.
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
## 2021-03-07 NOTE — Discharge Instructions (Signed)
Take the naproxen twice a day for the next 7 days.  After 7 days you can continue to take naproxen/ibuprofen and Tylenol as needed for pain.  Use the RICE method for the next 7 days: Rest Ice for 10-15 minutes every 4-6 hours as needed for pain and swelling Compression (ace bandage) Elevate above your hip when sitting or laying down.   If your symptoms have not improved at all in the next 7 days follow-up with orthopedics or sports medicine.

## 2021-03-07 NOTE — ED Notes (Addendum)
Unknown injury.  Patient was moving last week:lifting, carrying, and patient stands for long periods of time at work.  Patient points to mid-lateral left foot and instep as locations of pain

## 2021-07-07 ENCOUNTER — Ambulatory Visit (HOSPITAL_COMMUNITY)
Admission: EM | Admit: 2021-07-07 | Discharge: 2021-07-07 | Disposition: A | Discharge status: Home / Self Care | Payer: Medicaid Other | Attending: Emergency Medicine | Admitting: Emergency Medicine

## 2021-07-07 ENCOUNTER — Other Ambulatory Visit: Payer: Self-pay

## 2021-07-07 ENCOUNTER — Encounter (HOSPITAL_COMMUNITY): Payer: Self-pay

## 2021-07-07 DIAGNOSIS — Z3201 Encounter for pregnancy test, result positive: Secondary | ICD-10-CM

## 2021-07-07 LAB — POCT URINALYSIS DIPSTICK, ED / UC
Glucose, UA: NEGATIVE mg/dL
Hgb urine dipstick: NEGATIVE
Ketones, ur: 80 mg/dL — AB
Leukocytes,Ua: NEGATIVE
Nitrite: NEGATIVE
Protein, ur: 30 mg/dL — AB
Specific Gravity, Urine: >=1.03 (ref 1.005–1.030)
Urobilinogen, UA: 1 mg/dL (ref 0.0–1.0)
pH: 5.5 (ref 5.0–8.0)

## 2021-07-07 LAB — POC URINE PREG, ED: Preg Test, Ur: POSITIVE — AB

## 2021-07-07 MED ORDER — PRENATAL VITAMINS 28-0.8 MG PO TABS: 1.0000 | ORAL_TABLET | Freq: Every day | ORAL | 0 refills | Status: DC
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
## 2021-07-07 NOTE — Discharge Instructions (Addendum)
Your pregnancy test was positive.   Based on your LMP of 06/05/21, that makes you about 4 weeks and 4 days pregnant with an estimated due date of 03/12/22.    Take the prenatal vitamins daily. Do not take any medication with ibuprofen or aspirin.    You can try ginger and mint to help with nausea.  You can also try eating small frequent meals or snacks.  Make sure you are drinking plenty of fluids.    Follow up with OB/GYN in the next 2-4 weeks.

## 2021-07-07 NOTE — ED Triage Notes (Signed)
Pt presents for pregnancy testing.   States she had a positive pregnancy test at home yesterday.

## 2021-07-20 ENCOUNTER — Other Ambulatory Visit: Payer: Self-pay

## 2021-07-20 ENCOUNTER — Inpatient Hospital Stay (HOSPITAL_COMMUNITY): Payer: Medicaid Other

## 2021-07-20 ENCOUNTER — Inpatient Hospital Stay (HOSPITAL_COMMUNITY)
Admission: AD | Admit: 2021-07-20 | Discharge: 2021-07-20 | Disposition: A | Discharge status: Home / Self Care | Payer: Medicaid Other | Attending: Obstetrics & Gynecology | Admitting: Obstetrics & Gynecology

## 2021-07-20 ENCOUNTER — Encounter (HOSPITAL_COMMUNITY): Payer: Self-pay | Admitting: Obstetrics & Gynecology

## 2021-07-20 DIAGNOSIS — Z87891 Personal history of nicotine dependence: Secondary | ICD-10-CM | POA: Insufficient documentation

## 2021-07-20 DIAGNOSIS — Z87440 Personal history of urinary (tract) infections: Secondary | ICD-10-CM | POA: Insufficient documentation

## 2021-07-20 DIAGNOSIS — O23591 Infection of other part of genital tract in pregnancy, first trimester: Secondary | ICD-10-CM | POA: Insufficient documentation

## 2021-07-20 DIAGNOSIS — O208 Other hemorrhage in early pregnancy: Secondary | ICD-10-CM | POA: Insufficient documentation

## 2021-07-20 DIAGNOSIS — O26891 Other specified pregnancy related conditions, first trimester: Secondary | ICD-10-CM | POA: Diagnosis not present

## 2021-07-20 DIAGNOSIS — B373 Candidiasis of vulva and vagina: Secondary | ICD-10-CM | POA: Diagnosis not present

## 2021-07-20 DIAGNOSIS — Z3490 Encounter for supervision of normal pregnancy, unspecified, unspecified trimester: Secondary | ICD-10-CM

## 2021-07-20 DIAGNOSIS — O209 Hemorrhage in early pregnancy, unspecified: Secondary | ICD-10-CM

## 2021-07-20 DIAGNOSIS — Z3A01 Less than 8 weeks gestation of pregnancy: Secondary | ICD-10-CM | POA: Insufficient documentation

## 2021-07-20 DIAGNOSIS — B9689 Other specified bacterial agents as the cause of diseases classified elsewhere: Secondary | ICD-10-CM | POA: Diagnosis not present

## 2021-07-20 DIAGNOSIS — R109 Unspecified abdominal pain: Secondary | ICD-10-CM | POA: Diagnosis not present

## 2021-07-20 DIAGNOSIS — N760 Acute vaginitis: Secondary | ICD-10-CM

## 2021-07-20 HISTORY — DX: Fracture of unspecified parts of lumbosacral spine and pelvis, initial encounter for closed fracture: S32.9XXA

## 2021-07-20 LAB — URINALYSIS, ROUTINE W REFLEX MICROSCOPIC
Bacteria, UA: NONE SEEN
Bilirubin Urine: NEGATIVE
Glucose, UA: NEGATIVE mg/dL
Ketones, ur: NEGATIVE mg/dL
Leukocytes,Ua: NEGATIVE
Nitrite: NEGATIVE
Protein, ur: NEGATIVE mg/dL
Specific Gravity, Urine: 1.024 (ref 1.005–1.030)
pH: 6 (ref 5.0–8.0)

## 2021-07-20 LAB — CBC
HCT: 37.4 % (ref 36.0–46.0)
Hemoglobin: 12.6 g/dL (ref 12.0–15.0)
MCH: 30.7 pg (ref 26.0–34.0)
MCHC: 33.7 g/dL (ref 30.0–36.0)
MCV: 91.2 fL (ref 80.0–100.0)
Platelets: 197 10*3/uL (ref 150–400)
RBC: 4.1 MIL/uL (ref 3.87–5.11)
RDW: 12 % (ref 11.5–15.5)
WBC: 4.4 10*3/uL (ref 4.0–10.5)
nRBC: 0 % (ref 0.0–0.2)

## 2021-07-20 LAB — WET PREP, GENITAL
Sperm: NONE SEEN
Trich, Wet Prep: NONE SEEN
Yeast Wet Prep HPF POC: NONE SEEN

## 2021-07-20 LAB — HCG, QUANTITATIVE, PREGNANCY: hCG, Beta Chain, Quant, S: 134709 m[IU]/mL — ABNORMAL HIGH (ref <5)

## 2021-07-20 LAB — HIV ANTIBODY (ROUTINE TESTING W REFLEX): HIV Screen 4th Generation wRfx: NONREACTIVE

## 2021-07-20 MED ORDER — METRONIDAZOLE 0.75 % VA GEL: 1.0000 | Freq: Every day | VAGINAL | 0 refills | Status: AC

## 2021-07-20 MED ORDER — PRENATAL VITAMINS 28-0.8 MG PO TABS: 1.0000 | ORAL_TABLET | Freq: Every day | ORAL | 11 refills | Status: DC

## 2021-07-20 MED ORDER — PROMETHAZINE HCL 25 MG PO TABS: 25.0000 mg | ORAL_TABLET | Freq: Four times a day (QID) | ORAL | 0 refills | Status: DC | PRN

## 2021-07-20 NOTE — MAU Note (Signed)
Linda Khan is a 25 y.o. at [redacted]w[redacted]d here in MAU reporting: woke up this AM with vaginal bleeding, states her panties were full. Is also having some abdominal cramping. Also nausea and vomiting.  Onset of complaint: today  Pain score: 4/10  Vitals:   07/20/21 0946  BP: 114/79  Pulse: 74  Resp: 16  Temp: 98.3 F (36.8 C)  SpO2: 100%     Lab orders placed from triage: UA

## 2021-07-20 NOTE — MAU Provider Note (Signed)
History     CSN: [id number]  Arrival date and time: 07/20/21 0092   Event Date/Time   First Provider Initiated Contact with Patient 07/20/21 1014      Chief Complaint  Patient presents with   Abdominal Pain   Vaginal Bleeding   Emesis   Nausea   Linda Khan is a 25 y.o. year old [id number] female at [redacted]w[redacted]d weeks gestation who presents to MAU reporting she woke up with vaginal bleeding that soaked her panties and through to her pants, but not her bed, abdominal cramping, N/V x 2 wks, and constipation (no BM in x 3 days). She states the bleeding has stopped since she has arrived to MAU, but the abdominal pain has continued. She has not taken anything for the abdominal pain or N/V. She has not established [facility] for this pregnancy at this time. She plans to establish care with [facility].   OB History     Gravida  3   Para  2   Term  2   Preterm  0   AB  0   Living  2      SAB  0   IAB  0   Ectopic  0   Multiple  0   Live Births  2           Past Medical History:  Diagnosis Date   Bell's palsy    Bell's palsy    Bronchitis    Chlamydia    Gonorrhea    Pelvic fracture (HCC)    Trichomonas vaginitis    UTI (urinary tract infection)     Past Surgical History:  Procedure Laterality Date   WISDOM TOOTH EXTRACTION      Family History  Problem Relation Age of Onset   Diabetes Maternal Aunt    Diabetes Maternal Grandmother    Diabetes Paternal Grandmother    Asthma Mother    Healthy Father     Social History   Tobacco Use   Smoking status: Former   Smokeless tobacco: Never  Building services engineer Use: Never used  Substance Use Topics   Alcohol use: Not Currently    Comment: occasional   Drug use: Yes    Types: Marijuana    Comment: daily    Allergies: No Known Allergies  Medications Prior to Admission  Medication Sig Dispense Refill Last Dose   [DISCONTINUED] Prenatal Vit-Fe Fumarate-FA (PRENATAL VITAMINS) 28-0.8 MG TABS Take 1 tablet by  mouth daily. 30 tablet 0 07/19/2021   acetaminophen (TYLENOL) 325 MG tablet Take 2 tablets (650 mg total) by mouth every 6 (six) hours as needed. 30 tablet 0 Unknown   benzonatate (TESSALON) 100 MG capsule Take 1 capsule (100 mg total) by mouth 3 (three) times daily as needed for cough. 21 capsule 0    fluticasone (FLONASE) 50 MCG/ACT nasal spray Place 1 spray into both nostrils daily. 15.8 mL 2    ibuprofen (ADVIL) 400 MG tablet Take 1 tablet (400 mg total) by mouth every 6 (six) hours as needed. 30 tablet 0    nitrofurantoin, macrocrystal-monohydrate, (MACROBID) 100 MG capsule Take 1 capsule (100 mg total) by mouth 2 (two) times daily. 10 capsule 0    phenazopyridine (PYRIDIUM) 200 MG tablet Take 1 tablet (200 mg total) by mouth 3 (three) times daily. 6 tablet 0    polyethylene glycol powder (GLYCOLAX/MIRALAX) 17 GM/SCOOP powder Take 17 g by mouth daily. 255 g 0     Review of Systems  Constitutional:  Positive for appetite change (d/t N/V).  HENT: Negative.    Eyes: Negative.   Respiratory: Negative.    Cardiovascular: Negative.   Gastrointestinal:  Positive for constipation, nausea and vomiting.  Endocrine: Negative.   Genitourinary:  Positive for pelvic pain and vaginal bleeding.  Musculoskeletal: Negative.   Skin: Negative.   Allergic/Immunologic: Negative.   Neurological: Negative.   Hematological: Negative.   Psychiatric/Behavioral: Negative.    Physical Exam   Blood pressure 121/78, pulse 76, temperature 98.3 F (36.8 C), temperature source Oral, resp. rate 16, height 5\' 3"  (1.6 m), weight 56.7 kg, last menstrual period 06/05/2021, SpO2 100 %, unknown if currently breastfeeding.  Physical Exam Vitals and nursing note reviewed. Exam conducted with a chaperone present.  Constitutional:      Appearance: Normal appearance. She is normal weight.  Cardiovascular:     Rate and Rhythm: Normal rate.  Pulmonary:     Effort: Pulmonary effort is normal.  Abdominal:     General:  Abdomen is flat.     Palpations: Abdomen is soft.  Genitourinary:    General: Normal vulva.     Comments: Pelvic exam: External genitalia normal, SE: vaginal walls pink and well rugated, cervix is smooth, pink, no lesions, small amt of brownish blood vaginal d/c -- WP, GC/CT done, cervix visually closed, Uterus is non-tender, no CMT or friability, no adnexal tenderness.  Musculoskeletal:        General: Normal range of motion.  Skin:    General: Skin is warm and dry.  Neurological:     Mental Status: She is alert and oriented to person, place, and time.  Psychiatric:        Mood and Affect: Mood normal.        Behavior: Behavior normal.        Thought Content: Thought content normal.        Judgment: Judgment normal.    MAU Course  Procedures  MDM CCUA UPT CBC ABO/Rh -- not drawn d/t known A POS HCG Wet Prep GC/CT -- pending HIV -- pending OB < 14 wks 08/06/2021 with TV  Results for orders placed or performed during the hospital encounter of 07/20/21 (from the past 24 hour(s))  Urinalysis, Routine w reflex microscopic Urine, Clean Catch     Status: Abnormal   Collection Time: 07/20/21 10:06 AM  Result Value Ref Range   Color, Urine YELLOW YELLOW   APPearance HAZY (A) CLEAR   Specific Gravity, Urine 1.024 1.005 - 1.030   pH 6.0 5.0 - 8.0   Glucose, UA NEGATIVE NEGATIVE mg/dL   Hgb urine dipstick MODERATE (A) NEGATIVE   Bilirubin Urine NEGATIVE NEGATIVE   Ketones, ur NEGATIVE NEGATIVE mg/dL   Protein, ur NEGATIVE NEGATIVE mg/dL   Nitrite NEGATIVE NEGATIVE   Leukocytes,Ua NEGATIVE NEGATIVE   RBC / HPF 0-5 0 - 5 RBC/hpf   WBC, UA 0-5 0 - 5 WBC/hpf   Bacteria, UA NONE SEEN NONE SEEN   Squamous Epithelial / LPF 6-10 0 - 5   Mucus PRESENT   CBC     Status: None   Collection Time: 07/20/21 10:37 AM  Result Value Ref Range   WBC 4.4 4.0 - 10.5 K/uL   RBC 4.10 3.87 - 5.11 MIL/uL   Hemoglobin 12.6 12.0 - 15.0 g/dL   HCT 07/22/21 62.2 - 29.7 %   MCV 91.2 80.0 - 100.0 fL   MCH 30.7  26.0 - 34.0 pg   MCHC 33.7 30.0 - 36.0 g/dL  RDW 12.0 11.5 - 15.5 %   Platelets 197 150 - 400 K/uL   nRBC 0.0 0.0 - 0.2 %  hCG, quantitative, pregnancy     Status: Abnormal   Collection Time: 07/20/21 10:37 AM  Result Value Ref Range   hCG, Beta Chain, Quant, S 134,709 (H) <5 mIU/mL  HIV Antibody (routine testing w rflx)     Status: None   Collection Time: 07/20/21 10:37 AM  Result Value Ref Range   HIV Screen 4th Generation wRfx Non Reactive Non Reactive  Wet prep, genital     Status: Abnormal   Collection Time: 07/20/21 10:50 AM   Specimen: Cervix  Result Value Ref Range   Yeast Wet Prep HPF POC NONE SEEN NONE SEEN   Trich, Wet Prep NONE SEEN NONE SEEN   Clue Cells Wet Prep HPF POC PRESENT (A) NONE SEEN   WBC, Wet Prep HPF POC MANY (A) NONE SEEN   Sperm NONE SEEN     US OB LESS THAN 14 WEEKS WITH OB TRANSVAGINAL  Result Date: 07/20/2021 CLINICAL DATA:  First trimester of pregnancy, heavy bleeding. EXAM: OBSTETRIC <14 WK [place] AND TRANSVAGINAL OB US TECHNIQUE: Both transabdominal and transvaginal ultrasound examinations were performed for complete evaluation of the gestation as well as the maternal uterus, adnexal regions, and pelvic cul-de-sac. Transvaginal technique was performed to assess early pregnancy. COMPARISON:  None. FINDINGS: Intrauterine gestational sac: Single Yolk sac:  Visualized. Embryo:  Visualized. Cardiac Activity: Visualized. Heart Rate: 144 bpm CRL:  10.5 mm   7 w   1 d                  [place] EDC: March 07, 2022. Subchorionic hemorrhage:  Small subchorionic hemorrhage is noted. Maternal uterus/adnexae: Corpus luteum cyst seen in left ovary. Right ovary is unremarkable. Trace free fluid is noted which most likely is physiologic. IMPRESSION: Single live intrauterine gestation of 7 weeks 1 day. Small subchorionic hemorrhage is noted. Electronically Signed   By: Lupita Raider M.D.   On: 07/20/2021 12:45     Assessment and Plan  1. Vaginal bleeding in pregnancy, first  trimester - Return to MAU: If you have heavier bleeding that soaks through more that 2 pads per hour for an hour or more If you bleed so much that you feel like you might pass out or you do pass out If you have significant abdominal pain that is not improved with Tylenol 1000 mg every 6 hours as needed for pain If you develop a fever > 100.5  - Information provided on [facility]   2. Abdominal pain during pregnancy in first trimester - Information provided on abdominal pain in pregnancy  3. Intrauterine pregnancy - Establish PNC with [facility] - Rx for Phenergan 25 mg po every 6 hours prn N/V - Information provided on PNC, morning sickness and list of OB providers   4. Bacterial vaginosis - Rx for Metrogel 0.75% vaginally hs - Information provided on BV    5. [redacted] weeks gestation of pregnancy  - Discharge patient - Call [facility] to schedule [facility] - Patient verbalized an understanding of the plan of care and agrees.   Raelyn Mora, CNM 07/20/2021, 10:14 AM

## 2021-07-20 NOTE — Discharge Instructions (Addendum)
Return to MAU: If you have heavier bleeding that soaks through more that 2 pads per hour for an hour or more If you bleed so much that you feel like you might pass out or you do pass out If you have significant abdominal pain that is not improved with Tylenol 1000 mg every 6 hours as needed for pain If you develop a fever > 100.5  Safe Medications in Pregnancy   Acne: Benzoyl Peroxide Salicylic Acid  Backache/Headache: Tylenol: 2 regular strength every 4 hours OR              2 Extra strength every 6 hours  Colds/Coughs/Allergies: Benadryl (alcohol free) 25 mg every 6 hours as needed Breath right strips Claritin Cepacol throat lozenges Chloraseptic throat spray Cold-Eeze- up to three times per day Cough drops, alcohol free Flonase (by prescription only) Guaifenesin Mucinex Robitussin DM (plain only, alcohol free) Saline nasal spray/drops Sudafed (pseudoephedrine) & Actifed ** use only after [redacted] weeks gestation and if you do not have high blood pressure Tylenol Vicks Vaporub Zinc lozenges Zyrtec   Constipation: Colace Ducolax suppositories Fleet enema Glycerin suppositories Metamucil Milk of magnesia Miralax Senokot Smooth move tea  Diarrhea: Kaopectate Imodium A-D  *NO pepto Bismol  Hemorrhoids: Anusol Anusol HC Preparation H Tucks  Indigestion: Tums Maalox Mylanta Zantac  Pepcid  Insomnia: Benadryl (alcohol free) 25mg  every 6 hours as needed Tylenol PM Unisom, no Gelcaps  Leg Cramps: Tums MagGel  Nausea/Vomiting:  Bonine Dramamine Emetrol Ginger extract Sea bands Meclizine  Nausea medication to take during pregnancy:  Unisom (doxylamine succinate 25 mg tablets) Take one tablet daily at bedtime. If symptoms are not adequately controlled, the dose can be increased to a maximum recommended dose of two tablets daily (1/2 tablet in the morning, 1/2 tablet mid-afternoon and one at bedtime). Vitamin B6 100mg  tablets. Take one tablet twice a  day (up to 200 mg per day).  Skin Rashes: Aveeno products Benadryl cream or 25mg  every 6 hours as needed Calamine Lotion 1% cortisone cream  Yeast infection: Gyne-lotrimin 7 Monistat 7   **If taking multiple medications, please check labels to avoid duplicating the same active ingredients **take medication as directed on the label ** Do not exceed 4000 mg of tylenol in 24 hours **Do not take medications that contain aspirin or ibuprofen   Center for [facility] Prenatal Care Providers          Center for [facility] locations:  Hours may vary. Please call for an appointment  Center for [facility] @ [facility]  [address] (corner of [address] and [address])  [phone]  Center for [facility] @ [facility]   [address]  [phone]  Center For [facility] @ [facility]       [address] [phone]            Center for [facility] @ [facility]     [phone]          Center for [facility] @ [facility]   [address] [phone]  Center for [facility] @ [facility]  [address] [place] [phone]     Center for [facility] @ [facility])  [address]   [phone]

## 2021-07-21 LAB — GC/CHLAMYDIA PROBE AMP (~~LOC~~) NOT AT ARMC
Chlamydia: NEGATIVE
Comment: NEGATIVE
Comment: NORMAL
Neisseria Gonorrhea: NEGATIVE

## 2021-08-11 LAB — CYTOLOGY - PAP: Pap: NEGATIVE

## 2021-08-11 LAB — OB RESULTS CONSOLE RUBELLA ANTIBODY, IGM: Rubella: IMMUNE

## 2021-08-23 ENCOUNTER — Other Ambulatory Visit: Payer: Self-pay | Admitting: Family Medicine

## 2021-08-23 DIAGNOSIS — Z3A13 13 weeks gestation of pregnancy: Secondary | ICD-10-CM

## 2021-08-23 DIAGNOSIS — Z3682 Encounter for antenatal screening for nuchal translucency: Secondary | ICD-10-CM

## 2021-08-31 ENCOUNTER — Ambulatory Visit: Payer: Medicaid Other

## 2021-08-31 ENCOUNTER — Encounter: Payer: Self-pay | Admitting: *Deleted

## 2021-08-31 ENCOUNTER — Ambulatory Visit: Payer: Medicaid Other | Attending: Family Medicine

## 2021-08-31 ENCOUNTER — Other Ambulatory Visit: Payer: Self-pay

## 2021-08-31 ENCOUNTER — Ambulatory Visit: Payer: Medicaid Other | Admitting: *Deleted

## 2021-08-31 VITALS — BP 110/53 | HR 81

## 2021-08-31 DIAGNOSIS — Z3A13 13 weeks gestation of pregnancy: Secondary | ICD-10-CM | POA: Insufficient documentation

## 2021-08-31 DIAGNOSIS — Z3682 Encounter for antenatal screening for nuchal translucency: Secondary | ICD-10-CM | POA: Insufficient documentation

## 2021-08-31 DIAGNOSIS — Z3401 Encounter for supervision of normal first pregnancy, first trimester: Secondary | ICD-10-CM | POA: Diagnosis present

## 2021-09-01 ENCOUNTER — Other Ambulatory Visit: Payer: Self-pay | Admitting: *Deleted

## 2021-09-01 DIAGNOSIS — Z363 Encounter for antenatal screening for malformations: Secondary | ICD-10-CM

## 2021-09-04 LAB — MATERNIT21 PLUS CORE+SCA
Fetal Fraction: 14
Monosomy X (Turner Syndrome): NOT DETECTED
Result (T21): NEGATIVE
Trisomy 13 (Patau syndrome): NEGATIVE
Trisomy 18 (Edwards syndrome): NEGATIVE
Trisomy 21 (Down syndrome): NEGATIVE
XXX (Triple X Syndrome): NOT DETECTED
XXY (Klinefelter Syndrome): NOT DETECTED
XYY (Jacobs Syndrome): NOT DETECTED

## 2021-09-05 ENCOUNTER — Telehealth: Payer: Self-pay | Admitting: Genetics

## 2021-09-05 NOTE — Telephone Encounter (Signed)
Linda Khan was contacted by telephone to review their noninvasive prenatal screening (NIPS) result. The result is low risk, consistent with a female fetus. This screening significantly reduces the risk that the current pregnancy has Down syndrome, Trisomy 56, Trisomy 73, and common sex chromosome aneuploidies. [name] understands that this is a screening and not a diagnostic test. All questions answered.

## 2021-09-08 ENCOUNTER — Encounter: Payer: Medicaid Other | Admitting: Certified Nurse Midwife

## 2021-09-14 ENCOUNTER — Encounter: Payer: Medicaid Other | Admitting: Family Medicine

## 2021-09-23 ENCOUNTER — Other Ambulatory Visit: Payer: Self-pay

## 2021-10-06 ENCOUNTER — Inpatient Hospital Stay (HOSPITAL_COMMUNITY)
Admission: AD | Admit: 2021-10-06 | Discharge: 2021-10-06 | Disposition: A | Discharge status: Home / Self Care | Payer: Medicaid Other | Attending: Obstetrics and Gynecology | Admitting: Obstetrics and Gynecology

## 2021-10-06 ENCOUNTER — Encounter (HOSPITAL_COMMUNITY): Payer: Self-pay | Admitting: Obstetrics and Gynecology

## 2021-10-06 DIAGNOSIS — R109 Unspecified abdominal pain: Secondary | ICD-10-CM

## 2021-10-06 DIAGNOSIS — Z3A18 18 weeks gestation of pregnancy: Secondary | ICD-10-CM

## 2021-10-06 DIAGNOSIS — O99891 Other specified diseases and conditions complicating pregnancy: Secondary | ICD-10-CM | POA: Diagnosis not present

## 2021-10-06 DIAGNOSIS — S334XXA Traumatic rupture of symphysis pubis, initial encounter: Secondary | ICD-10-CM

## 2021-10-06 LAB — URINALYSIS, ROUTINE W REFLEX MICROSCOPIC
Bilirubin Urine: NEGATIVE
Glucose, UA: NEGATIVE mg/dL
Hgb urine dipstick: NEGATIVE
Ketones, ur: 20 mg/dL — AB
Leukocytes,Ua: NEGATIVE
Nitrite: NEGATIVE
Protein, ur: NEGATIVE mg/dL
Specific Gravity, Urine: 1.023 (ref 1.005–1.030)
pH: 6 (ref 5.0–8.0)

## 2021-10-06 NOTE — MAU Note (Signed)
Pt reports she started having pelvic pressure 3 days ago. Pain has gotten worse over the past 2 days. Pain is when she walks or sits up.  Denies any vag bleeding or discharge. State she is also feeling a little lightheaded.

## 2021-10-06 NOTE — Discharge Instructions (Signed)
Avoid triggers as much as you can. Sit down to get dressed, and avoid heavy lifting and pushing (which you should be steering clear of anyway!). Apply a heating pad or ice pack to the pubic bone. If you use a heating pad, don't leave it on for more than 10 minutes at a time, since longer can can raise your baby's temperature. (You can safely cycle the pad on and off every 10 minutes.) Wear a pelvic support belt. They're readily available online and "corset" the pelvic bones back into place during pregnancy. Do your Kegels and pelvic tilts. Regular practice helps strengthen the muscles of the pelvis. Ask for pain relief. If the pain is severe, ask your practitioner about pain relievers.  Treatment for SPD in pregnancy If you have SPD pain, it may help to avoid:  Putting your weight on one leg. Sit down to get dressed, for example. Twisting movements or movements that involve spreading your legs. Turn your whole body rather than reaching one leg when you get out of the car, for example. (To get in, sit your bottom on the seat first, then move both legs forward at once.) Lifting and pushing heavy items. When you do lift anything, don't twist. Don't carry another child on your hip. Try not to push a heavy shopping cart. Sitting on the floor or in a twisted position (with your legs crossed). Standing or sitting for long periods. Vacuuming. Carrying something in one hand. To alleviate pain, try:  Cold or heat. Apply an ice pack to the pubic bone. Or try a heating pad. (Apply it to the pubic area, not your abdomen, for 10-15 minutes at a time.) Kegels and pelvic tilt exercises to strengthen the muscles in the area. Pregnancy pillows to help you find a comfortable sleeping position. Tucking a pillow between your knees may avoid stress on your hips and lower back.

## 2021-10-06 NOTE — MAU Provider Note (Signed)
Patient Linda Khan is a 25 y.o. at [redacted]w[redacted]d here with complaint of pelvic pain. She denies vaginal bleeding, dysuria, vaginal discharge. She had separated pubis symphysis in her last pregnancy and she feels like this is the same thing. She denies nausea, vomiting, fever, SOB, no other complaints. She has an appt at [facility] on 11/16 History     CSN: [id number]  Arrival date and time: 10/06/21 1126   Event Date/Time   First Provider Initiated Contact with Patient 10/06/21 1254      Chief Complaint  Patient presents with   Pelvic Pain   Pelvic Pain The patient's primary symptoms include pelvic pain. The current episode started 1 to 4 weeks ago. The problem occurs constantly. Pertinent negatives include no back pain, chills, constipation, diarrhea, dysuria, fever or urgency. The symptoms are aggravated by activity and heavy lifting. She has tried nothing for the symptoms.   OB History     Gravida  3   Para  2   Term  2   Preterm  0   AB  0   Living  2      SAB  0   IAB  0   Ectopic  0   Multiple  0   Live Births  2           Past Medical History:  Diagnosis Date   Bell's palsy    Bell's palsy    Bronchitis    Chlamydia    Gonorrhea    Pelvic fracture (HCC)    Trichomonas vaginitis    UTI (urinary tract infection)     Past Surgical History:  Procedure Laterality Date   WISDOM TOOTH EXTRACTION      Family History  Problem Relation Age of Onset   Diabetes Maternal Aunt    Diabetes Maternal Grandmother    Diabetes Paternal Grandmother    Asthma Mother    Healthy Father     Social History   Tobacco Use   Smoking status: Former   Smokeless tobacco: Never  Building services engineer Use: Never used  Substance Use Topics   Alcohol use: Not Currently    Comment: occasional   Drug use: Not Currently    Types: Marijuana    Comment: regularly    Allergies: No Known Allergies  Medications Prior to Admission  Medication Sig Dispense Refill Last Dose    Prenatal Vit-Fe Fumarate-FA (PRENATAL VITAMINS) 28-0.8 MG TABS Take 1 tablet by mouth daily. 30 tablet 11 Past Month   fluticasone (FLONASE) 50 MCG/ACT nasal spray Place 1 spray into both nostrils daily. 15.8 mL 2    promethazine (PHENERGAN) 25 MG tablet Take 1 tablet (25 mg total) by mouth every 6 (six) hours as needed for nausea or vomiting. If vomiting, insert vaginally or rectally for absorption into body 30 tablet 0     Review of Systems  Constitutional:  Negative for chills and fever.  Gastrointestinal:  Negative for constipation and diarrhea.  Genitourinary:  Positive for pelvic pain. Negative for dysuria and urgency.  Musculoskeletal:  Negative for back pain.  Physical Exam   Blood pressure 118/64, pulse 83, temperature 98.2 F (36.8 C), height 5\' 3"  (1.6 m), weight 56.2 kg, last menstrual period 06/01/2021, unknown if currently breastfeeding.  Physical Exam Constitutional:      Appearance: Normal appearance.  HENT:     Right Ear: Tympanic membrane normal.  Cardiovascular:     Rate and Rhythm: Normal rate.  Pulmonary:  Effort: Pulmonary effort is normal.  Abdominal:     General: Abdomen is flat. There is no distension.     Tenderness: There is no abdominal tenderness.  Musculoskeletal:        General: Tenderness present.     Comments: Difficulty with ambulation  Skin:    Capillary Refill: Capillary refill takes less than 2 seconds.  Neurological:     General: No focal deficit present.     Mental Status: She is alert.  Psychiatric:        Mood and Affect: Mood normal.    MAU Course  Procedures  MDM FHR by Doppler Patient is tender over the suprapubic area; symptoms seem most consistent with dislocation of pubic symphysis; she has no other vaginal or OB complaints.  Patient had to leave to pick up her son so MAU course was shortened  Assessment and Plan   1. Dislocation of symphysis pubis, initial encounter   -discussed physiology of SPD and what happens  in pregnancy.  -reviewed comfort measures, positioning, exercises for SPD -referral made to PT; they will call her within 2-3 days -recommend pelvic binder -2nd trimester warning signs given, especailly that it is safe to use ibuprofen at this gestation  Charlesetta Garibaldi Adryen Cookson 10/06/2021, 2:06 PM

## 2021-10-12 ENCOUNTER — Ambulatory Visit: Payer: Medicaid Other | Attending: Obstetrics

## 2021-10-12 ENCOUNTER — Ambulatory Visit: Payer: Medicaid Other

## 2021-11-17 ENCOUNTER — Telehealth: Payer: Self-pay | Admitting: Family Medicine

## 2021-11-17 NOTE — Telephone Encounter (Signed)
Patient is requesting a order for a ultrasound

## 2021-11-17 NOTE — Telephone Encounter (Signed)
Per chart review, patient missed ultrasound appt on 11/16. Scheduled ultrasound for next available appt which was 1/16 @ 1230. Called & informed patient. Patient verbalized understanding.

## 2021-12-08 ENCOUNTER — Encounter: Payer: Self-pay | Admitting: Obstetrics and Gynecology

## 2021-12-08 ENCOUNTER — Encounter: Payer: Medicaid Other | Admitting: Obstetrics and Gynecology

## 2021-12-09 NOTE — Progress Notes (Signed)
Patient did not keep her OB appointment for 12/08/2021.  Cornelia Copa MD Attending [facility] for [employer])

## 2021-12-12 ENCOUNTER — Ambulatory Visit: Payer: Medicaid Other | Attending: Obstetrics

## 2021-12-12 ENCOUNTER — Other Ambulatory Visit: Payer: Medicaid Other

## 2022-07-08 ENCOUNTER — Ambulatory Visit (HOSPITAL_COMMUNITY)
Admission: EM | Admit: 2022-07-08 | Discharge: 2022-07-08 | Disposition: A | Discharge status: Home / Self Care | Payer: Medicaid Other | Attending: Internal Medicine | Admitting: Internal Medicine

## 2022-07-08 ENCOUNTER — Encounter (HOSPITAL_COMMUNITY): Payer: Self-pay

## 2022-07-08 DIAGNOSIS — N939 Abnormal uterine and vaginal bleeding, unspecified: Secondary | ICD-10-CM

## 2022-07-08 DIAGNOSIS — R42 Dizziness and giddiness: Secondary | ICD-10-CM | POA: Diagnosis not present

## 2022-07-08 DIAGNOSIS — R531 Weakness: Secondary | ICD-10-CM

## 2022-07-08 LAB — POC URINE PREG, ED: Preg Test, Ur: NEGATIVE

## 2022-07-08 NOTE — Discharge Instructions (Signed)
Go to the emergency department as soon as you leave urgent care for further evaluation and management. 
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
## 2022-07-08 NOTE — ED Triage Notes (Signed)
Pt reports having her IUD placed on 06/30/2022. Pt reports she is bleeding 1-2 pads per hour and passing clots. Pt reports abdominal cramping.

## 2023-05-30 ENCOUNTER — Ambulatory Visit (HOSPITAL_COMMUNITY)
Admission: EM | Admit: 2023-05-30 | Discharge: 2023-05-30 | Disposition: A | Discharge status: Home / Self Care | Payer: Medicaid Other | Attending: Emergency Medicine | Admitting: Emergency Medicine

## 2023-05-30 ENCOUNTER — Encounter (HOSPITAL_COMMUNITY): Payer: Self-pay | Admitting: Emergency Medicine

## 2023-05-30 ENCOUNTER — Other Ambulatory Visit: Payer: Self-pay

## 2023-05-30 DIAGNOSIS — L0100 Impetigo, unspecified: Secondary | ICD-10-CM | POA: Diagnosis not present

## 2023-05-30 DIAGNOSIS — R21 Rash and other nonspecific skin eruption: Secondary | ICD-10-CM

## 2023-05-30 MED ORDER — TERBINAFINE HCL 250 MG PO TABS: 250.0000 mg | ORAL_TABLET | Freq: Every day | ORAL | 0 refills | Status: AC

## 2023-05-30 MED ORDER — METHYLPREDNISOLONE SODIUM SUCC 125 MG IJ SOLR: 60.0000 mg | Freq: Once | INTRAMUSCULAR | Status: DC

## 2023-05-30 MED ORDER — MUPIROCIN 2 % EX OINT: 1.0000 | TOPICAL_OINTMENT | Freq: Two times a day (BID) | CUTANEOUS | 0 refills | Status: DC

## 2023-05-30 MED ORDER — SULFAMETHOXAZOLE-TRIMETHOPRIM 800-160 MG PO TABS: 1.0000 | ORAL_TABLET | Freq: Two times a day (BID) | ORAL | 0 refills | Status: AC

## 2023-05-30 MED ORDER — HYDROXYZINE HCL 25 MG PO TABS: 25.0000 mg | ORAL_TABLET | Freq: Four times a day (QID) | ORAL | 0 refills | Status: DC

## 2023-05-30 NOTE — Discharge Instructions (Signed)
You have an extensive rash of your body.  You can take the hydroxyzine every 6 hours for itching.  Please apply the mupirocin ointment twice daily to help prevent infection.  Please refrain from itching and scratching the open wounds.  I am covering you with antibiotics, take them with food for the next 7 days.  You also do the Lamisil orally once daily for the next 14 days in case this is a tinea infection.   Follow-up with your primary care or with this clinic in the next week or 2 to ensure improvement. Seek care if you develop fever, worsening of your rash, or any new concerning symptoms.

## 2023-05-30 NOTE — ED Triage Notes (Signed)
Rash to right leg, right chest and reportedly to genital area.  Rash itches.  Some bumps are intact, some are oozing serous liquid.   Rash for 2 weeks.  Children reported as having ring.    Has used lotrimin.  Used anti itch cream
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
## 2023-12-05 ENCOUNTER — Ambulatory Visit (HOSPITAL_COMMUNITY)
Admission: EM | Admit: 2023-12-05 | Discharge: 2023-12-05 | Disposition: A | Discharge status: Home / Self Care | Payer: Medicaid Other | Attending: Family Medicine | Admitting: Family Medicine

## 2023-12-05 ENCOUNTER — Encounter (HOSPITAL_COMMUNITY): Payer: Self-pay

## 2023-12-05 DIAGNOSIS — Z113 Encounter for screening for infections with a predominantly sexual mode of transmission: Secondary | ICD-10-CM | POA: Diagnosis present

## 2023-12-05 DIAGNOSIS — N898 Other specified noninflammatory disorders of vagina: Secondary | ICD-10-CM

## 2023-12-05 MED ORDER — METRONIDAZOLE 500 MG PO TABS: 500.0000 mg | ORAL_TABLET | Freq: Two times a day (BID) | ORAL | 0 refills | Status: DC

## 2023-12-05 NOTE — ED Triage Notes (Signed)
 Vaginal itching x 3 days. Patient thinks she has BV.

## 2023-12-05 NOTE — Discharge Instructions (Signed)
 You were seen today for vaginal symptoms.  I have sent out flagyl to treat possible bacterial vaginosis.  Your swab will be resulted tomorrow and if there is anything else that needs to be treated you will be notified.
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
## 2023-12-06 LAB — CERVICOVAGINAL ANCILLARY ONLY
Bacterial Vaginitis (gardnerella): POSITIVE — AB
Candida Glabrata: NEGATIVE
Candida Vaginitis: NEGATIVE
Chlamydia: NEGATIVE
Comment: NEGATIVE
Comment: NEGATIVE
Comment: NEGATIVE
Comment: NEGATIVE
Comment: NEGATIVE
Comment: NORMAL
Neisseria Gonorrhea: NEGATIVE
Trichomonas: POSITIVE — AB

## 2024-01-27 ENCOUNTER — Other Ambulatory Visit: Payer: Self-pay

## 2024-01-27 ENCOUNTER — Telehealth (HOSPITAL_COMMUNITY): Payer: Self-pay | Admitting: Emergency Medicine

## 2024-01-27 ENCOUNTER — Emergency Department (HOSPITAL_COMMUNITY)
Admission: EM | Admit: 2024-01-27 | Discharge: 2024-01-27 | Disposition: A | Discharge status: Home / Self Care | Attending: Emergency Medicine | Admitting: Emergency Medicine

## 2024-01-27 ENCOUNTER — Telehealth: Payer: Self-pay

## 2024-01-27 ENCOUNTER — Emergency Department (HOSPITAL_COMMUNITY)

## 2024-01-27 ENCOUNTER — Encounter (HOSPITAL_COMMUNITY): Payer: Self-pay

## 2024-01-27 DIAGNOSIS — S92534A Nondisplaced fracture of distal phalanx of right lesser toe(s), initial encounter for closed fracture: Secondary | ICD-10-CM | POA: Diagnosis not present

## 2024-01-27 DIAGNOSIS — S99921A Unspecified injury of right foot, initial encounter: Secondary | ICD-10-CM | POA: Diagnosis present

## 2024-01-27 DIAGNOSIS — W208XXA Other cause of strike by thrown, projected or falling object, initial encounter: Secondary | ICD-10-CM | POA: Insufficient documentation

## 2024-01-27 MED ORDER — ONDANSETRON 4 MG PO TBDP
4.0000 mg | ORAL_TABLET | Freq: Once | ORAL | Status: AC
  Administered 2024-01-27: 4 mg via ORAL
  Filled 2024-01-27: qty 1

## 2024-01-27 MED ORDER — KETOROLAC TROMETHAMINE 15 MG/ML IJ SOLN
15.0000 mg | Freq: Once | INTRAMUSCULAR | Status: AC
  Administered 2024-01-27: 15 mg via INTRAMUSCULAR
  Filled 2024-01-27: qty 1

## 2024-01-27 MED ORDER — HYDROCODONE-ACETAMINOPHEN 5-325 MG PO TABS: 2.0000 | ORAL_TABLET | ORAL | 0 refills | Status: DC | PRN

## 2024-01-27 MED ORDER — HYDROCODONE-ACETAMINOPHEN 5-325 MG PO TABS
1.0000 | ORAL_TABLET | Freq: Once | ORAL | Status: AC
  Administered 2024-01-27: 1 via ORAL
  Filled 2024-01-27: qty 1

## 2024-01-27 NOTE — Discharge Instructions (Addendum)
 Your x-ray showed fracture of the small toe.  Follow-up with the orthopedist.  I have sent pain medication into the pharmacy for you.  Predominantly take Tylenol 1000 mg every 8 hours, ibuprofen 600 mg every 6-8 hours, and reserve the pain medicine for severe breakthrough pain.  Pain medicine will make you drowsy.  Be careful after taking it.  Return for any emergent symptoms.

## 2024-01-27 NOTE — ED Triage Notes (Signed)
 Pt c.o right pinky toe pain, states she dropped a dresser on it last night. Bruising noted. Last dose of ibuprofen was last night

## 2024-01-27 NOTE — Telephone Encounter (Signed)
<  S Buntyn called in to say that the pharmacy the medication was sent to is closed.As this is controlled substance, PA Karie Mainland has to switch it over, she wants it to [facility] on [address].  Called and cancelled script to [facility] on [address]

## 2024-01-27 NOTE — Telephone Encounter (Cosign Needed)
 Patient called stating that she needs her medication sent to a new pharmacy as her pharmacy is closed.  Case manager will leave a message for the pharmacy not to fill the previous prescription.

## 2024-01-27 NOTE — ED Provider Notes (Signed)
 [name] Provider Note   CSN: [id number] Arrival date & time: 01/27/24  4098     History  Chief Complaint  Patient presents with   Toe Injury    Linda Khan is a 28 y.o. female.  28 year old female presents today for concern of right foot pain.  Pain is primarily located over the fifth, fourth, and third toes.  She states she dropped a dresser over the toes.  There is bruising over the small toe.  Denies any other injuries.  The history is provided by the patient. No language interpreter was used.       Home Medications Prior to Admission medications   Medication Sig Start Date End Date Taking? Authorizing Provider  metroNIDAZOLE (FLAGYL) 500 MG tablet Take 1 tablet (500 mg total) by mouth 2 (two) times daily. 12/05/23   Jannifer Franklin, MD      Allergies    Patient has no known allergies.    Review of Systems   Review of Systems  Constitutional:  Negative for chills and fever.  Skin:  Positive for wound.  All other systems reviewed and are negative.   Physical Exam Updated Vital Signs BP 100/60   Pulse 77   Temp 98 F (36.7 C)   Resp 18   Ht 5\' 4"  (1.626 m)   Wt 57.6 kg   LMP 01/27/2024   SpO2 100%   BMI 21.80 kg/m  Physical Exam Vitals and nursing note reviewed.  Constitutional:      General: She is not in acute distress.    Appearance: Normal appearance. She is not ill-appearing.  HENT:     Head: Normocephalic and atraumatic.     Nose: Nose normal.  Eyes:     Conjunctiva/sclera: Conjunctivae normal.  Cardiovascular:     Rate and Rhythm: Normal rate.  Pulmonary:     Effort: Pulmonary effort is normal. No respiratory distress.  Musculoskeletal:        General: No deformity.     Comments: Bruising noted to the right small toe.  Significant tenderness to palpation present over the lateral 3 toes.  Neurovascularly intact.  Able to bear weight and ambulate but with an antalgic gait.  Skin:    Findings:  No rash.  Neurological:     Mental Status: She is alert.     ED Results / Procedures / Treatments   Labs (all labs ordered are listed, but only abnormal results are displayed) Labs Reviewed - No data to display  EKG None  Radiology No results found.  Procedures Procedures    Medications Ordered in ED Medications  ketorolac (TORADOL) 15 MG/ML injection 15 mg (has no administration in time range)  HYDROcodone-acetaminophen (NORCO/VICODIN) 5-325 MG per tablet 1 tablet (has no administration in time range)  ondansetron (ZOFRAN-ODT) disintegrating tablet 4 mg (has no administration in time range)    ED Course/ Medical Decision Making/ A&P                                 Medical Decision Making Amount and/or Complexity of Data Reviewed Radiology: ordered.  Risk Prescription drug management.   28 year old female presents today for concern of right lateral toe pain that started last night after dropping a dresser over this area.  X-ray was obtained.  I personally reviewed and agree with radiology interpretation.  It shows right nondisplaced tuft fracture of the fifth toe.  Postop shoe given.  Crutches given.  Discussed weightbearing as tolerated.  Orthopedic referral given.  Ace wrap given at patient's request.  Patient discharged in stable condition.  Return precaution discussed.  Short course of pain medicine.   Final Clinical Impression(s) / ED Diagnoses Final diagnoses:  Closed nondisplaced fracture of distal phalanx of lesser toe of right foot, initial encounter    Rx / DC Orders ED Discharge Orders          Ordered    HYDROcodone-acetaminophen (NORCO/VICODIN) 5-325 MG tablet  Every 4 hours PRN        01/27/24 1215              Marita Kansas, PA-C 01/27/24 1236    Jacalyn Lefevre, MD 01/27/24 1348

## 2024-01-28 ENCOUNTER — Telehealth: Payer: Self-pay

## 2024-01-28 ENCOUNTER — Telehealth (HOSPITAL_COMMUNITY): Payer: Self-pay | Admitting: Emergency Medicine

## 2024-01-28 MED ORDER — HYDROCODONE-ACETAMINOPHEN 5-325 MG PO TABS: 2.0000 | ORAL_TABLET | ORAL | 0 refills | Status: AC | PRN

## 2024-01-28 NOTE — Telephone Encounter (Cosign Needed)
 Patient states her medication is not in the pharmacy.  Nurse confirmed with pharmacy that it is not at that pharmacy.  Will resend.

## 2024-01-28 NOTE — Telephone Encounter (Signed)
 Called today stating that the medication sent over to [facility] on [address] was not there. Spoke to Baltazar Apo, [place] he sent it back to the [facility]. This RNCM called both [facility] and it was not at [facility] it was at the original, the patient is aware.

## 2024-06-10 ENCOUNTER — Encounter (HOSPITAL_COMMUNITY): Payer: Self-pay | Admitting: Emergency Medicine

## 2024-06-10 ENCOUNTER — Ambulatory Visit (HOSPITAL_COMMUNITY)
Admission: EM | Admit: 2024-06-10 | Discharge: 2024-06-10 | Disposition: A | Discharge status: Home / Self Care | Attending: Physician Assistant | Admitting: Physician Assistant

## 2024-06-10 DIAGNOSIS — Z3202 Encounter for pregnancy test, result negative: Secondary | ICD-10-CM

## 2024-06-10 DIAGNOSIS — M545 Low back pain, unspecified: Secondary | ICD-10-CM | POA: Insufficient documentation

## 2024-06-10 LAB — POCT URINALYSIS DIP (MANUAL ENTRY)
Bilirubin, UA: NEGATIVE
Blood, UA: NEGATIVE
Glucose, UA: NEGATIVE mg/dL
Ketones, POC UA: NEGATIVE mg/dL
Leukocytes, UA: NEGATIVE
Nitrite, UA: NEGATIVE
Protein Ur, POC: NEGATIVE mg/dL
Spec Grav, UA: 1.025 (ref 1.010–1.025)
Urobilinogen, UA: 2 U/dL — AB
pH, UA: 6.5 (ref 5.0–8.0)

## 2024-06-10 LAB — POCT URINE PREGNANCY: Preg Test, Ur: NEGATIVE

## 2024-06-10 MED ORDER — CYCLOBENZAPRINE HCL 10 MG PO TABS: 10.0000 mg | ORAL_TABLET | Freq: Two times a day (BID) | ORAL | 0 refills | Status: AC | PRN

## 2024-06-10 NOTE — ED Triage Notes (Signed)
 Pt c/o lower back pain for 2 days. Today having abd pains. Denies urinary or bowel problems. Took Ibuprofen  last night but didn't help.
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
## 2024-06-11 LAB — CERVICOVAGINAL ANCILLARY ONLY
Bacterial Vaginitis (gardnerella): POSITIVE — AB
Candida Glabrata: NEGATIVE
Candida Vaginitis: NEGATIVE
Chlamydia: NEGATIVE
Comment: NEGATIVE
Comment: NEGATIVE
Comment: NEGATIVE
Comment: NEGATIVE
Comment: NEGATIVE
Comment: NORMAL
Neisseria Gonorrhea: NEGATIVE
Trichomonas: NEGATIVE

## 2024-06-12 ENCOUNTER — Ambulatory Visit (HOSPITAL_COMMUNITY): Payer: Self-pay

## 2024-06-12 MED ORDER — METRONIDAZOLE 500 MG PO TABS: 500.0000 mg | ORAL_TABLET | Freq: Two times a day (BID) | ORAL | 0 refills | Status: DC

## 2024-06-30 ENCOUNTER — Other Ambulatory Visit: Payer: Self-pay

## 2024-06-30 ENCOUNTER — Emergency Department (HOSPITAL_COMMUNITY): Admission: EM | Admit: 2024-06-30 | Discharge: 2024-06-30 | Disposition: A | Discharge status: Home / Self Care

## 2024-06-30 ENCOUNTER — Encounter (HOSPITAL_COMMUNITY): Payer: Self-pay

## 2024-06-30 DIAGNOSIS — R112 Nausea with vomiting, unspecified: Secondary | ICD-10-CM | POA: Diagnosis present

## 2024-06-30 DIAGNOSIS — K529 Noninfective gastroenteritis and colitis, unspecified: Secondary | ICD-10-CM | POA: Diagnosis not present

## 2024-06-30 LAB — CBC WITH DIFFERENTIAL/PLATELET
Abs Immature Granulocytes: 0.01 K/uL (ref 0.00–0.07)
Basophils Absolute: 0 K/uL (ref 0.0–0.1)
Basophils Relative: 0 %
Eosinophils Absolute: 0.1 K/uL (ref 0.0–0.5)
Eosinophils Relative: 2 %
HCT: 44.8 % (ref 36.0–46.0)
Hemoglobin: 14.9 g/dL (ref 12.0–15.0)
Immature Granulocytes: 0 %
Lymphocytes Relative: 29 %
Lymphs Abs: 1.1 K/uL (ref 0.7–4.0)
MCH: 30.4 pg (ref 26.0–34.0)
MCHC: 33.3 g/dL (ref 30.0–36.0)
MCV: 91.4 fL (ref 80.0–100.0)
Monocytes Absolute: 0.5 K/uL (ref 0.1–1.0)
Monocytes Relative: 15 %
Neutro Abs: 1.9 K/uL (ref 1.7–7.7)
Neutrophils Relative %: 54 %
Platelets: 183 K/uL (ref 150–400)
RBC: 4.9 MIL/uL (ref 3.87–5.11)
RDW: 12.4 % (ref 11.5–15.5)
WBC: 3.6 K/uL — ABNORMAL LOW (ref 4.0–10.5)
nRBC: 0 % (ref 0.0–0.2)

## 2024-06-30 LAB — URINALYSIS, ROUTINE W REFLEX MICROSCOPIC
Bilirubin Urine: NEGATIVE
Glucose, UA: NEGATIVE mg/dL
Hgb urine dipstick: NEGATIVE
Ketones, ur: 80 mg/dL — AB
Leukocytes,Ua: NEGATIVE
Nitrite: NEGATIVE
Protein, ur: 100 mg/dL — AB
Specific Gravity, Urine: 1.028 (ref 1.005–1.030)
pH: 5 (ref 5.0–8.0)

## 2024-06-30 LAB — BASIC METABOLIC PANEL WITH GFR
Anion gap: 14 (ref 5–15)
BUN: 17 mg/dL (ref 6–20)
CO2: 18 mmol/L — ABNORMAL LOW (ref 22–32)
Calcium: 8.7 mg/dL — ABNORMAL LOW (ref 8.9–10.3)
Chloride: 100 mmol/L (ref 98–111)
Creatinine, Ser: 0.94 mg/dL (ref 0.44–1.00)
GFR, Estimated: >60 mL/min (ref >60)
Glucose, Bld: 71 mg/dL (ref 70–99)
Potassium: 3.5 mmol/L (ref 3.5–5.1)
Sodium: 132 mmol/L — ABNORMAL LOW (ref 135–145)

## 2024-06-30 LAB — LIPASE, BLOOD: Lipase: 23 U/L (ref 11–51)

## 2024-06-30 LAB — PREGNANCY, URINE: Preg Test, Ur: NEGATIVE

## 2024-06-30 MED ORDER — DICYCLOMINE HCL 10 MG PO CAPS
10.0000 mg | ORAL_CAPSULE | Freq: Once | ORAL | Status: AC
  Administered 2024-06-30: 10 mg via ORAL
  Filled 2024-06-30: qty 1

## 2024-06-30 MED ORDER — DICYCLOMINE HCL 20 MG PO TABS: 20.0000 mg | ORAL_TABLET | Freq: Two times a day (BID) | ORAL | 0 refills | Status: AC

## 2024-06-30 MED ORDER — ONDANSETRON HCL 4 MG PO TABS: 4.0000 mg | ORAL_TABLET | Freq: Three times a day (TID) | ORAL | Status: AC | PRN

## 2024-06-30 MED ORDER — ONDANSETRON HCL 4 MG/2ML IJ SOLN
4.0000 mg | Freq: Once | INTRAMUSCULAR | Status: AC | PRN
  Administered 2024-06-30: 4 mg via INTRAVENOUS
  Filled 2024-06-30: qty 2

## 2024-06-30 MED ORDER — METOCLOPRAMIDE HCL 5 MG/ML IJ SOLN
10.0000 mg | Freq: Once | INTRAMUSCULAR | Status: AC
  Administered 2024-06-30: 10 mg via INTRAVENOUS
  Filled 2024-06-30: qty 2

## 2024-06-30 NOTE — Discharge Instructions (Signed)
 Thank you for allowing us  to care for you today.  We believe your symptoms are related to gastroenteritis/viral illness.  We were able to give you fluids and medicine to help with your nausea and vomiting.  I am giving you a prescription for Zofran  to be taken every 8 hours as needed for nausea and vomiting as well as Bentyl  to be taken twice daily for 5 days as needed for abdominal pain.  Please be sure to follow-up with your primary care provider  Please return to the emergency department if you experience worsening pain, inability to tolerate anything by mouth, chest pain, shortness of breath, passout or believe you need emergent medical care  We hope you feel better soon Lavanda Bolster DO

## 2024-06-30 NOTE — ED Provider Notes (Signed)
 [name] EMERGENCY DEPARTMENT AT [facility] Provider Note   CSN: [id number] Arrival date & time: 06/30/24  9275     Patient presents with: Vomiting   Linda Khan is a 28 y.o. female past medical history of Bell's palsy who presents emergency department for abdominal pain, nausea and vomiting since Friday.  Patient states that she had acute onset nausea vomiting and abdominal pain beginning Friday that has been continuous throughout the weekend.  Patient states that she has been unable to tolerate p.o. including fluids.  Patient endorses generalized abdominal tenderness.  She denies associated fever, dysuria, chest pain, shortness of breath, syncope.  Her last menstrual cycle was in July and patient states that she does feel similar to her prior pregnancies.  She is currently sexually active.  She denies vaginal pain, bleeding or discharge.   HPI     Prior to Admission medications   Medication Sig Start Date End Date Taking? Authorizing Provider  dicyclomine  (BENTYL ) 20 MG tablet Take 1 tablet (20 mg total) by mouth 2 (two) times daily for 5 days. 06/30/24 07/05/24 Yes Nada Chroman, DO  ondansetron  (ZOFRAN ) 4 MG tablet Take 1 tablet (4 mg total) by mouth every 8 (eight) hours as needed for up to 3 days for nausea or vomiting. 06/30/24 07/03/24 Yes Nada Chroman, DO  cyclobenzaprine  (FLEXERIL ) 10 MG tablet Take 1 tablet (10 mg total) by mouth 2 (two) times daily as needed for muscle spasms. 06/10/24   Billy Asberry FALCON, PA-C  HYDROcodone -acetaminophen  (NORCO/VICODIN) 5-325 MG tablet Take 2 tablets by mouth every 4 (four) hours as needed. 01/28/24   Hildegard Loge, PA-C  metroNIDAZOLE  (FLAGYL ) 500 MG tablet Take 1 tablet (500 mg total) by mouth 2 (two) times daily. 12/05/23   Piontek, Rocky, MD  metroNIDAZOLE  (FLAGYL ) 500 MG tablet Take 1 tablet (500 mg total) by mouth 2 (two) times daily. 06/12/24   Banister, Pamela K, MD    Allergies: Patient has no known allergies.    Review of  Systems  Updated Vital Signs BP 114/68   Pulse 68   Temp 97.8 F (36.6 C) (Oral)   Resp 19   Ht 5' 3 (1.6 m)   Wt 56.7 kg   LMP 04/02/2024 (Approximate)   SpO2 100%   BMI 22.14 kg/m   Physical Exam Vitals and nursing note reviewed.  Constitutional:      General: She is not in acute distress.    Appearance: She is not ill-appearing.  HENT:     Head: Normocephalic and atraumatic.  Eyes:     Pupils: Pupils are equal, round, and reactive to light.  Neck:     Trachea: Trachea normal.  Cardiovascular:     Rate and Rhythm: Normal rate and regular rhythm.     Pulses:          Radial pulses are 2+ on the right side and 2+ on the left side.     Heart sounds: Normal heart sounds. No murmur heard. Pulmonary:     Effort: Pulmonary effort is normal. No tachypnea.     Breath sounds: Normal breath sounds.  Abdominal:     General: There is no distension.     Palpations: Abdomen is soft.     Tenderness: There is generalized abdominal tenderness. There is no right CVA tenderness or left CVA tenderness.  Musculoskeletal:     Right lower leg: No edema.     Left lower leg: No edema.     Comments: Spontaneous movement  of bilateral upper and lower extremities  Skin:    Capillary Refill: Capillary refill takes less than 2 seconds.  Neurological:     Mental Status: She is alert.  Psychiatric:        Behavior: Behavior is cooperative.     (all labs ordered are listed, but only abnormal results are displayed) Labs Reviewed  CBC WITH DIFFERENTIAL/PLATELET - Abnormal; Notable for the following components:      Result Value   WBC 3.6 (*)    All other components within normal limits  BASIC METABOLIC PANEL WITH GFR - Abnormal; Notable for the following components:   Sodium 132 (*)    CO2 18 (*)    Calcium  8.7 (*)    All other components within normal limits  URINALYSIS, ROUTINE W REFLEX MICROSCOPIC - Abnormal; Notable for the following components:   APPearance CLOUDY (*)    Ketones,  ur 80 (*)    Protein, ur 100 (*)    Bacteria, UA RARE (*)    All other components within normal limits  PREGNANCY, URINE  LIPASE, BLOOD    EKG: None  Radiology: No results found.   Procedures   Medications Ordered in the ED  ondansetron  (ZOFRAN ) injection 4 mg (4 mg Intravenous Given 06/30/24 0801)  metoCLOPramide  (REGLAN ) injection 10 mg (10 mg Intravenous Given 06/30/24 0853)  dicyclomine  (BENTYL ) capsule 10 mg (10 mg Oral Given 06/30/24 9147)                                    Medical Decision Making Amount and/or Complexity of Data Reviewed Labs: ordered.  Risk Prescription drug management.   On initial evaluation patient is hemodynamically stable, afebrile and not in acute distress.  Based upon patient's history and physical examination findings differential diagnosis include: Gastroenteritis, pancreatitis, pregnancy, dehydration, infection, acute cystitis.  Will give patient antiemetics and continue fluid bolus started by EMS.  Patient's presentation less likely acute cystitis, pancreatitis, pregnancy as urinalysis without evidence of infection or hematuria, pregnancy test negative, CBC without leukocytosis or anemia and patient is afebrile.  At this time believe patient's presentation is secondary to gastroenteritis, cannot rule out viral etiology however do not believe patient's presentation bacterial gastroenteritis as patient is afebrile without leukocytosis.  Upon reevaluation with antiemetics given, patient's nausea and emesis improving with the ability to tolerate p.o.  At this time believe patient is safe to be discharged home with prescription for Zofran  and Bentyl , strict return precautions and recommendations to follow-up with patient's primary care provider.  At the time of discharge patient agreed and understood plan of care and had no further questions     Final diagnoses:  Gastroenteritis    ED Discharge Orders          Ordered    ondansetron  (ZOFRAN ) 4 MG  tablet  Every 8 hours PRN        06/30/24 0953    dicyclomine  (BENTYL ) 20 MG tablet  2 times daily        06/30/24 0953           Lavanda Bolster DO Emergency Medicine PGY2    Bolster Lavanda, DO 06/30/24 9041    Simon Lavonia SAILOR, MD 06/30/24 1415

## 2024-06-30 NOTE — ED Triage Notes (Signed)
 Pt arrives to ED via EMS with c/o n/v and abdominal pain x 3 days. Pt reports not being able to keep anything down since Friday.

## 2024-07-10 ENCOUNTER — Other Ambulatory Visit: Payer: Self-pay

## 2024-07-10 ENCOUNTER — Emergency Department (HOSPITAL_COMMUNITY)
Admission: EM | Admit: 2024-07-10 | Discharge: 2024-07-10 | Disposition: A | Discharge status: Home / Self Care | Attending: Emergency Medicine | Admitting: Emergency Medicine

## 2024-07-10 ENCOUNTER — Encounter (HOSPITAL_COMMUNITY): Payer: Self-pay

## 2024-07-10 ENCOUNTER — Emergency Department (HOSPITAL_COMMUNITY)

## 2024-07-10 DIAGNOSIS — R1013 Epigastric pain: Secondary | ICD-10-CM | POA: Diagnosis present

## 2024-07-10 LAB — CBC
HCT: 39.9 % (ref 36.0–46.0)
Hemoglobin: 13.9 g/dL (ref 12.0–15.0)
MCH: 30.6 pg (ref 26.0–34.0)
MCHC: 34.8 g/dL (ref 30.0–36.0)
MCV: 87.9 fL (ref 80.0–100.0)
Platelets: 251 K/uL (ref 150–400)
RBC: 4.54 MIL/uL (ref 3.87–5.11)
RDW: 12 % (ref 11.5–15.5)
WBC: 5.2 K/uL (ref 4.0–10.5)
nRBC: 0 % (ref 0.0–0.2)

## 2024-07-10 LAB — URINALYSIS, ROUTINE W REFLEX MICROSCOPIC
Glucose, UA: NEGATIVE mg/dL
Hgb urine dipstick: NEGATIVE
Ketones, ur: 5 mg/dL — AB
Leukocytes,Ua: NEGATIVE
Nitrite: NEGATIVE
Protein, ur: 30 mg/dL — AB
Specific Gravity, Urine: 1.027 (ref 1.005–1.030)
pH: 5 (ref 5.0–8.0)

## 2024-07-10 LAB — COMPREHENSIVE METABOLIC PANEL WITH GFR
ALT: 45 U/L — ABNORMAL HIGH (ref 0–44)
AST: 38 U/L (ref 15–41)
Albumin: 3.5 g/dL (ref 3.5–5.0)
Alkaline Phosphatase: 32 U/L — ABNORMAL LOW (ref 38–126)
Anion gap: 7 (ref 5–15)
BUN: 8 mg/dL (ref 6–20)
CO2: 23 mmol/L (ref 22–32)
Calcium: 8.7 mg/dL — ABNORMAL LOW (ref 8.9–10.3)
Chloride: 105 mmol/L (ref 98–111)
Creatinine, Ser: 0.56 mg/dL (ref 0.44–1.00)
GFR, Estimated: >60 mL/min (ref >60)
Glucose, Bld: 98 mg/dL (ref 70–99)
Potassium: 3.1 mmol/L — ABNORMAL LOW (ref 3.5–5.1)
Sodium: 135 mmol/L (ref 135–145)
Total Bilirubin: 0.4 mg/dL (ref 0.0–1.2)
Total Protein: 6.3 g/dL — ABNORMAL LOW (ref 6.5–8.1)

## 2024-07-10 LAB — LIPASE, BLOOD: Lipase: 37 U/L (ref 11–51)

## 2024-07-10 LAB — HCG, SERUM, QUALITATIVE: Preg, Serum: NEGATIVE

## 2024-07-10 MED ORDER — IOHEXOL 350 MG/ML SOLN
75.0000 mL | Freq: Once | INTRAVENOUS | Status: AC | PRN
  Administered 2024-07-10: 75 mL via INTRAVENOUS

## 2024-07-10 MED ORDER — LACTATED RINGERS IV BOLUS: 1000.0000 mL | Freq: Once | INTRAVENOUS | Status: DC

## 2024-07-10 NOTE — Discharge Instructions (Addendum)
 Thank you for allowing us  to care for you today.  At this time we believe you are safe to be discharged home.  I am giving you a referral to a primary care doctor to establish care and obtain the laboratory studies that we discussed during your evaluation (hemoglobin A1c, lipid panel, GI pathogen panel.)   Please consider abstaining from marijuana use as it could be relating to her abdominal pain.  Please review the information attached explaining cannabinoid hyperemesis syndrome  Please return to the emergency department if you have worsening abdominal pain, develop chest pain or shortness of breath, inability to tolerate anything by mouth secondary to vomiting, passout or believe you need emergent medical care.

## 2024-07-10 NOTE — ED Triage Notes (Signed)
 Pt arrived from home via [facility] c/o abd pain, epigastric pain about an hour ago that woke her from sleep was 10/10, before EMS arrived pt states that the pain stopped. Pt states that she has been having bright yellow diarrhea twice a day x 1 week at present.

## 2024-07-10 NOTE — ED Provider Triage Note (Signed)
 Emergency Medicine Provider Triage Evaluation Note  Linda Khan , a 28 y.o. female  was evaluated in triage.  Pt complains of abdominal pain that woke her from her sleep.  Reports that she has been having ongoing nausea, vomiting and diarrhea.   Review of Systems  Positive: none Negative: none  Physical Exam  BP 115/65 (BP Location: Right Arm)   Pulse 86   Temp 98.2 F (36.8 C)   Resp 20   Ht 5' 4 (1.626 m)   Wt 49.9 kg   LMP 05/07/2024 (Approximate)   SpO2 99%   Breastfeeding Unknown   BMI 18.88 kg/m  Gen:   Awake, no distress   Resp:  Normal effort  MSK:   Moves extremities without difficulty  Other:  Right upper quadrant epigastric tenderness  Medical Decision Making  Medically screening exam initiated at 4:54 AM.  Appropriate orders placed.  Linda Khan was informed that the remainder of the evaluation will be completed by another provider, this initial triage assessment does not replace that evaluation, and the importance of remaining in the ED until their evaluation is complete.  Will obtain routine labs.  Given bounce back and acuity of symptoms today will obtain CT imaging.   Linda Lynwood DEL, PA-C 07/10/24 [phone]

## 2024-07-10 NOTE — ED Notes (Signed)
 Awaiting patient from lobby.

## 2024-07-10 NOTE — ED Provider Notes (Signed)
 [place] EMERGENCY DEPARTMENT AT [facility] Provider Note   CSN: [id number] Arrival date & time: 07/10/24  9573     Patient presents with: Abdominal Pain   Linda Khan is a 28 y.o. female with no significant past medical history who presents emergency department for abdominal pain.  Patient states she awoke at approximately 3:30 AM with sharp epigastric abdominal pain that self resolved after 30 minutes.  Patient denies abdominal pain now.  Patient does endorse associated nonbloody diarrhea however denies hematemesis, emesis or current nausea.  Patient denies associated fevers.  Patient denies association with p.o. intake.  She does endorse history of alcohol use however not current, current marijuana use and denies IV drug use.    Abdominal Pain      Prior to Admission medications   Medication Sig Start Date End Date Taking? Authorizing Provider  cyclobenzaprine  (FLEXERIL ) 10 MG tablet Take 1 tablet (10 mg total) by mouth 2 (two) times daily as needed for muscle spasms. 06/10/24   Billy Asberry FALCON, PA-C  dicyclomine  (BENTYL ) 20 MG tablet Take 1 tablet (20 mg total) by mouth 2 (two) times daily for 5 days. 06/30/24 07/05/24  Davionne Dowty, DO  HYDROcodone -acetaminophen  (NORCO/VICODIN) 5-325 MG tablet Take 2 tablets by mouth every 4 (four) hours as needed. 01/28/24   Hildegard Loge, PA-C  metroNIDAZOLE  (FLAGYL ) 500 MG tablet Take 1 tablet (500 mg total) by mouth 2 (two) times daily. 12/05/23   Piontek, Rocky, MD  metroNIDAZOLE  (FLAGYL ) 500 MG tablet Take 1 tablet (500 mg total) by mouth 2 (two) times daily. 06/12/24   Banister, Pamela K, MD    Allergies: Patient has no known allergies.    Review of Systems  Gastrointestinal:  Positive for abdominal pain.    Updated Vital Signs BP 107/74 (BP Location: Left Arm)   Pulse 75   Temp 98.6 F (37 C)   Resp 20   Ht 5' 4 (1.626 m)   Wt 49.9 kg   LMP 05/07/2024 (Approximate)   SpO2 100%   Breastfeeding Unknown   BMI 18.88 kg/m    Physical Exam Vitals reviewed.  Constitutional:      General: She is not in acute distress. Cardiovascular:     Rate and Rhythm: Normal rate and regular rhythm.     Heart sounds: Normal heart sounds. No murmur heard. Pulmonary:     Effort: Pulmonary effort is normal.     Breath sounds: Normal breath sounds.  Abdominal:     General: Bowel sounds are normal. There is no distension.     Palpations: Abdomen is soft.     Tenderness: There is no abdominal tenderness. There is no right CVA tenderness or left CVA tenderness.     Hernia: No hernia is present.  Skin:    General: Skin is warm.     Capillary Refill: Capillary refill takes less than 2 seconds.     Findings: No rash.  Neurological:     Mental Status: She is alert.     (all labs ordered are listed, but only abnormal results are displayed) Labs Reviewed  COMPREHENSIVE METABOLIC PANEL WITH GFR - Abnormal; Notable for the following components:      Result Value   Potassium 3.1 (*)    Calcium  8.7 (*)    Total Protein 6.3 (*)    ALT 45 (*)    Alkaline Phosphatase 32 (*)    All other components within normal limits  URINALYSIS, ROUTINE W REFLEX MICROSCOPIC - Abnormal; Notable  for the following components:   Color, Urine AMBER (*)    APPearance HAZY (*)    Bilirubin Urine SMALL (*)    Ketones, ur 5 (*)    Protein, ur 30 (*)    Bacteria, UA RARE (*)    All other components within normal limits  LIPASE, BLOOD  CBC  HCG, SERUM, QUALITATIVE    EKG: EKG Interpretation Date/Time:  Thursday July 10 2024 05:07:45 EDT Ventricular Rate:  73 PR Interval:  166 QRS Duration:  88 QT Interval:  398 QTC Calculation: 438 R Axis:   73  Text Interpretation: Normal sinus rhythm Normal ECG When compared with ECG of 29-Aug-2013 21:04, PREVIOUS ECG IS PRESENT when compard to priorm similar appearance No STEMI Confirmed by Ginger Barefoot ([id number]) on 07/10/2024 10:14:35 AM  Radiology: CT ABDOMEN PELVIS W CONTRAST Result Date:  07/10/2024 CLINICAL DATA:  28 year old female with abdominal pain woke her from sleep. Nausea vomiting and diarrhea. EXAM: CT ABDOMEN AND PELVIS WITH CONTRAST TECHNIQUE: Multidetector CT imaging of the abdomen and pelvis was performed using the standard protocol following bolus administration of intravenous contrast. RADIATION DOSE REDUCTION: This exam was performed according to the departmental dose-optimization program which includes automated exposure control, adjustment of the mA and/or kV according to patient size and/or use of iterative reconstruction technique. CONTRAST:  75mL OMNIPAQUE  IOHEXOL  350 MG/ML SOLN COMPARISON:  CT Abdomen and Pelvis 10/04/2019. FINDINGS: Lower chest: Normal. Hepatobiliary: Liver is stable since 2020, small but circumscribed 8 mm left hepatic lobe low-density area is unchanged and probable benign cyst or hemangioma (no follow-up imaging recommended). Mild focal fat also along the falciform ligament. Negative gallbladder. No bile duct enlargement. Pancreas: Negative. Spleen: Negative. Adrenals/Urinary Tract: Normal adrenal glands. Symmetric and normal renal enhancement. No hydronephrosis, nephrolithiasis, or renal inflammation identified. Diminutive ureters and bladder. Stomach/Bowel: Nondilated but fluid containing large and small bowel loops throughout the abdomen and pelvis. Some layering fluid in the large bowel as distal as the descending colon in the left lower quadrant. No large bowel wall thickening. And gas containing appendix appears to remain within normal limits on coronal image 38 medial to the cecum. But suggestion of generalized bowel mucosal hyperenhancement (coronal image 49). Decompressed stomach and duodenum. No pneumoperitoneum. No free fluid in the abdomen. No discrete mesenteric inflammation. 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 No ventral hernia. Vascular/Lymphatic: Major arterial structures and portal venous system in the abdomen and  pelvis appear patent and normal. No lymphadenopathy identified. Reproductive: IUD now in place, no adverse features. Uterus and ovaries otherwise within normal limits. Other: Trace free fluid in the right cul-de-sac with simple fluid density appears likely to be physiologic on series 3, image 72. Musculoskeletal: Negative. IMPRESSION: 1. Fluid-filled nondilated large and small bowel throughout the abdomen and pelvis with suggestion of mild mucosal hyperenhancement. Consider Acute Enteritis, Diarrhea. Appendix appears to remain normal. 2. Trace free fluid in the cul-de-sac is likely physiologic. IUD in place with no adverse features. Electronically Signed   By: VEAR Hurst M.D.   On: 07/10/2024 06:56     Procedures   Medications Ordered in the ED  iohexol  (OMNIPAQUE ) 350 MG/ML injection 75 mL (75 mLs Intravenous Contrast Given 07/10/24 0610)                                    Medical Decision Making Amount and/or Complexity of Data Reviewed Labs: ordered. Radiology: ordered.  On initial evaluation patient is hemodynamically stable, afebrile and not in any acute distress.  She is resting comfortably during initial evaluation.  Based upon patient's history differential diagnosis includes gastroenteritis,  pancreatitis, acute cholecystitis, biliary colic, cannabinoid hyperemesis syndrome.  Based on patient's history and physical examination findings laboratory studies as well as CT imaging obtained.  Laboratory studies without evidence of leukocytosis, anemia, severe electrolyte abnormalities, renal impairment.  Lipase within normal limits.  Urine pregnancy test negative.  Urinalysis without evidence of infection or hematuria.  CT abdomen pelvis consistent with mild mucosal hyperenhancement consistent with gastroenteritis and diarrhea.  On reevaluation of the patient she states that her abdominal pain has completely resolved and she has had no recurrent symptoms.  As patient's presentation less likely   pancreatitis as lipase is within normal limits, acute cholecystitis or biliary colic as CT findings without  evidence of biliary pathology patient is okay to discharge at this time.  Cannot rule out gastroenteritis or cannabinoid hyperemesis syndrome however patient is tolerating p.o. intake and is hemodynamically stable.  Will give patient primary care provider referral at discharge with recommendations to follow-up in outpatient setting to obtain GIP panel, lipid panel and hemoglobin A1c.  Discussion held with patient and education given regarding cannabinol use and hyperemesis syndrome.  Patient agreeable with plan of care at discharge and strict return precautions were given.     Final diagnoses:  Epigastric pain    ED Discharge Orders     None       Lavanda Bolster DO Emergency Medicine PGY2    Bolster Lavanda, DO 07/10/24 1507    Tegeler, Lonni PARAS, MD 07/11/24 1249

## 2024-10-26 ENCOUNTER — Other Ambulatory Visit: Payer: Self-pay

## 2024-10-26 ENCOUNTER — Emergency Department (HOSPITAL_COMMUNITY)

## 2024-10-26 ENCOUNTER — Encounter (HOSPITAL_COMMUNITY): Payer: Self-pay | Admitting: *Deleted

## 2024-10-26 ENCOUNTER — Emergency Department (HOSPITAL_COMMUNITY)
Admission: EM | Admit: 2024-10-26 | Discharge: 2024-10-26 | Disposition: A | Discharge status: Home / Self Care | Attending: Emergency Medicine | Admitting: Emergency Medicine

## 2024-10-26 DIAGNOSIS — M546 Pain in thoracic spine: Secondary | ICD-10-CM | POA: Diagnosis not present

## 2024-10-26 DIAGNOSIS — M542 Cervicalgia: Secondary | ICD-10-CM | POA: Diagnosis present

## 2024-10-26 DIAGNOSIS — W1830XA Fall on same level, unspecified, initial encounter: Secondary | ICD-10-CM | POA: Insufficient documentation

## 2024-10-26 DIAGNOSIS — W19XXXA Unspecified fall, initial encounter: Secondary | ICD-10-CM

## 2024-10-26 DIAGNOSIS — Y92512 Supermarket, store or market as the place of occurrence of the external cause: Secondary | ICD-10-CM | POA: Diagnosis not present

## 2024-10-26 MED ORDER — IBUPROFEN 400 MG PO TABS
600.0000 mg | ORAL_TABLET | Freq: Once | ORAL | Status: AC
  Administered 2024-10-26: 600 mg via ORAL
  Filled 2024-10-26: qty 1

## 2024-10-26 NOTE — ED Provider Notes (Signed)
 [place] EMERGENCY DEPARTMENT AT [facility] Provider Note   CSN: [id number] Arrival date & time: 10/26/24  1615     Patient presents with: No chief complaint on file.  HPI Linda Khan is a 28 y.o. female presenting for fall that occurred around 1 PM today while walking around the grocery store.  She states she slipped on some water and fell backwards holding a basket of groceries in her hands.  Now endorsing posterior neck pain and midthoracic back pain.  Has been ambulatory since the fall.  Denies chest pain or shortness of breath.   HPI     Prior to Admission medications   Medication Sig Start Date End Date Taking? Authorizing Provider  cyclobenzaprine  (FLEXERIL ) 10 MG tablet Take 1 tablet (10 mg total) by mouth 2 (two) times daily as needed for muscle spasms. 06/10/24   Billy Asberry FALCON, PA-C  dicyclomine  (BENTYL ) 20 MG tablet Take 1 tablet (20 mg total) by mouth 2 (two) times daily for 5 days. 06/30/24 07/05/24  Grubb, Abigail, DO  HYDROcodone -acetaminophen  (NORCO/VICODIN) 5-325 MG tablet Take 2 tablets by mouth every 4 (four) hours as needed. 01/28/24   Hildegard Loge, PA-C  metroNIDAZOLE  (FLAGYL ) 500 MG tablet Take 1 tablet (500 mg total) by mouth 2 (two) times daily. 12/05/23   Piontek, Rocky, MD  metroNIDAZOLE  (FLAGYL ) 500 MG tablet Take 1 tablet (500 mg total) by mouth 2 (two) times daily. 06/12/24   Banister, Pamela K, MD    Allergies: Patient has no known allergies.    Review of Systems See HPI  Updated Vital Signs BP 117/65 (BP Location: Left Arm)   Pulse 78   Temp 98.4 F (36.9 C) (Oral)   Resp 14   Ht 5' 4 (1.626 m)   Wt 49.9 kg   LMP 10/15/2024   SpO2 100%   BMI 18.88 kg/m   Physical Exam Vitals and nursing note reviewed.  HENT:     Head: Normocephalic and atraumatic.     Mouth/Throat:     Mouth: Mucous membranes are moist.  Eyes:     General:        Right eye: No discharge.        Left eye: No discharge.     Conjunctiva/sclera: Conjunctivae  normal.  Neck:     Comments: Neck appears atraumatic.  There is some tenderness with palpation to the posterior neck generally. Cardiovascular:     Rate and Rhythm: Normal rate and regular rhythm.     Pulses: Normal pulses.     Heart sounds: Normal heart sounds.  Pulmonary:     Effort: Pulmonary effort is normal.     Breath sounds: Normal breath sounds.  Abdominal:     General: Abdomen is flat.     Palpations: Abdomen is soft.  Musculoskeletal:     Cervical back: Full passive range of motion without pain, normal range of motion and neck supple.       Back:  Skin:    General: Skin is warm and dry.  Neurological:     General: No focal deficit present.  Psychiatric:        Mood and Affect: Mood normal.     (all labs ordered are listed, but only abnormal results are displayed) Labs Reviewed - No data to display  EKG: None  Radiology: DG Cervical Spine Complete Result Date: 10/26/2024 CLINICAL DATA:  Fall EXAM: CERVICAL SPINE - COMPLETE 4+ VIEW COMPARISON:  None Available. FINDINGS: There is no evidence of  cervical spine fracture or prevertebral soft tissue swelling. Alignment is normal. No other significant bone abnormalities are identified. IMPRESSION: Negative cervical spine radiographs. Electronically Signed   By: Greig Pique M.D.   On: 10/26/2024 18:03   DG Thoracic Spine 2 View Result Date: 10/26/2024 CLINICAL DATA:  Status post fall EXAM: THORACIC SPINE 2 VIEWS COMPARISON:  None Available. FINDINGS: There is no evidence of thoracic spine fracture. Alignment is normal. No other significant bone abnormalities are identified. IMPRESSION: No fracture or traumatic malalignment. Electronically Signed   By: Limin  Xu M.D.   On: 10/26/2024 18:02     Procedures   Medications Ordered in the ED  ibuprofen  (ADVIL ) tablet 600 mg (600 mg Oral Given 10/26/24 1810)                                    Medical Decision Making Amount and/or Complexity of Data Reviewed Radiology:  ordered.   28 year old well-appearing female presenting for fall that occurred today.  Exam notable for some general tenderness in the posterior neck and mid thoracic spine but no evidence of trauma on exam and she looks well overall.  X-rays were negative here.  Feel this is likely soft tissue injury sustained in the fall.  Advised supportive treatment and NSAIDs at home.  Advised her to follow-up with her PCP.  Discharged good condition.     Final diagnoses:  Fall, initial encounter    ED Discharge Orders     None          Lang Norleen POUR, PA-C 10/26/24 1814    Armenta Canning, MD 10/26/24 [phone]

## 2024-10-26 NOTE — ED Triage Notes (Signed)
 The pt fell at 0100pm today in the grocery store  since then she has had  lt shoulder  and anterior and posterior neck pain  lmp  lmp mid november

## 2024-10-26 NOTE — Discharge Instructions (Signed)
 Evaluation for your fall was reassuring.  Your x-rays were negative for acute injury.  Suspect you may have some bruising or inflammation in your upper back and lower neck.  Recommend ibuprofen  and applying ice in areas that hurt for the next 3 to 4 days.  Please follow-up with your PCP.

## 2024-10-28 ENCOUNTER — Encounter (HOSPITAL_COMMUNITY): Payer: Self-pay | Admitting: Emergency Medicine

## 2024-10-28 ENCOUNTER — Ambulatory Visit (HOSPITAL_COMMUNITY)
Admission: EM | Admit: 2024-10-28 | Discharge: 2024-10-28 | Disposition: A | Discharge status: Home / Self Care | Attending: Internal Medicine | Admitting: Internal Medicine

## 2024-10-28 DIAGNOSIS — N760 Acute vaginitis: Secondary | ICD-10-CM | POA: Diagnosis present

## 2024-10-28 DIAGNOSIS — N898 Other specified noninflammatory disorders of vagina: Secondary | ICD-10-CM | POA: Insufficient documentation

## 2024-10-28 MED ORDER — METRONIDAZOLE 500 MG PO TABS: 500.0000 mg | ORAL_TABLET | Freq: Two times a day (BID) | ORAL | 0 refills | Status: AC

## 2024-10-28 NOTE — Discharge Instructions (Signed)
 Your evaluation suggests that you likely have bacterial vaginitis.  Your vaginal testing is pending and will come back in the next 2-3 days. You will receive a phone call from staff if you test positive for any other infections. We will go ahead and start treatment for BV. Take antibiotic as prescribed to treat this. If you were given flagyl  pills, do not drink alcohol within 72 hours of taking this medication as this can cause severe nausea and vomiting.   Follow-up with OB/GYN or return to urgent care as needed.
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
## 2024-10-28 NOTE — ED Triage Notes (Signed)
 Pt reports vaginal odor x 4 days. States has a hx of BV. Denies abnormal vaginal discharge.

## 2024-10-29 ENCOUNTER — Ambulatory Visit (HOSPITAL_COMMUNITY): Payer: Self-pay | Admitting: Internal Medicine

## 2024-10-29 LAB — CERVICOVAGINAL ANCILLARY ONLY
Bacterial Vaginitis (gardnerella): POSITIVE — AB
Candida Glabrata: NEGATIVE
Candida Vaginitis: NEGATIVE
Chlamydia: NEGATIVE
Comment: NEGATIVE
Comment: NEGATIVE
Comment: NEGATIVE
Comment: NEGATIVE
Comment: NEGATIVE
Comment: NORMAL
Neisseria Gonorrhea: NEGATIVE
Trichomonas: NEGATIVE
